# Patient Record
Sex: Female | Born: 1950
Health system: Southern US, Community
[De-identification: ages and names within clinical notes are randomized; demographics above are authoritative.]

## PROBLEM LIST (undated history)

## (undated) DIAGNOSIS — Z9889 Other specified postprocedural states: Secondary | ICD-10-CM

## (undated) DIAGNOSIS — R112 Nausea with vomiting, unspecified: Secondary | ICD-10-CM

## (undated) DIAGNOSIS — K859 Acute pancreatitis without necrosis or infection, unspecified: Secondary | ICD-10-CM

## (undated) DIAGNOSIS — T7840XA Allergy, unspecified, initial encounter: Secondary | ICD-10-CM

## (undated) DIAGNOSIS — M1710 Unilateral primary osteoarthritis, unspecified knee: Secondary | ICD-10-CM

## (undated) DIAGNOSIS — K219 Gastro-esophageal reflux disease without esophagitis: Secondary | ICD-10-CM

## (undated) DIAGNOSIS — D649 Anemia, unspecified: Secondary | ICD-10-CM

## (undated) DIAGNOSIS — E119 Type 2 diabetes mellitus without complications: Secondary | ICD-10-CM

## (undated) DIAGNOSIS — N189 Chronic kidney disease, unspecified: Secondary | ICD-10-CM

## (undated) DIAGNOSIS — M179 Osteoarthritis of knee, unspecified: Secondary | ICD-10-CM

## (undated) HISTORY — PX: SMALL INTESTINE SURGERY: SHX150

## (undated) HISTORY — DX: Acute pancreatitis without necrosis or infection, unspecified: K85.90

## (undated) HISTORY — PX: CHOLECYSTECTOMY: SHX55

## (undated) HISTORY — PX: APPENDECTOMY: SHX54

## (undated) HISTORY — DX: Osteoarthritis of knee, unspecified: M17.9

## (undated) HISTORY — DX: Allergy, unspecified, initial encounter: T78.40XA

## (undated) HISTORY — PX: ANKLE SURGERY: SHX546

## (undated) HISTORY — PX: OTHER SURGICAL HISTORY: SHX169

## (undated) HISTORY — PX: ABDOMINAL HYSTERECTOMY: SHX81

## (undated) HISTORY — DX: Anemia, unspecified: D64.9

## (undated) HISTORY — DX: Chronic kidney disease, unspecified: N18.9

## (undated) HISTORY — PX: EYE SURGERY: SHX253

## (undated) HISTORY — PX: JOINT REPLACEMENT: SHX530

## (undated) HISTORY — PX: FRACTURE SURGERY: SHX138

## (undated) HISTORY — DX: Unilateral primary osteoarthritis, unspecified knee: M17.10

## (undated) HISTORY — DX: Gastro-esophageal reflux disease without esophagitis: K21.9

---

## 2001-01-06 ENCOUNTER — Other Ambulatory Visit: Admission: RE | Admit: 2001-01-06 | Discharge: 2001-01-06 | Payer: Self-pay | Admitting: Obstetrics & Gynecology

## 2003-01-15 ENCOUNTER — Ambulatory Visit (HOSPITAL_BASED_OUTPATIENT_CLINIC_OR_DEPARTMENT_OTHER): Admission: RE | Admit: 2003-01-15 | Discharge: 2003-01-15 | Payer: Self-pay | Admitting: Orthopedic Surgery

## 2003-04-19 ENCOUNTER — Ambulatory Visit (HOSPITAL_BASED_OUTPATIENT_CLINIC_OR_DEPARTMENT_OTHER): Admission: RE | Admit: 2003-04-19 | Discharge: 2003-04-19 | Payer: Self-pay | Admitting: Orthopedic Surgery

## 2003-09-17 ENCOUNTER — Other Ambulatory Visit: Admission: RE | Admit: 2003-09-17 | Discharge: 2003-09-17 | Payer: Self-pay | Admitting: Obstetrics & Gynecology

## 2008-06-18 ENCOUNTER — Encounter: Admission: RE | Admit: 2008-06-18 | Discharge: 2008-07-18 | Payer: Self-pay | Admitting: Neurosurgery

## 2009-12-18 ENCOUNTER — Encounter: Admission: RE | Admit: 2009-12-18 | Discharge: 2009-12-18 | Payer: Self-pay | Admitting: Otolaryngology

## 2010-11-27 NOTE — Op Note (Signed)
   NAME:  Peggy Santiago, Peggy Santiago                       ACCOUNT NO.:  [id number]   MEDICAL RECORD NO.:  [id number]                   PATIENT TYPE:  AMB   LOCATION:  [facility]SC                                  FACILITY:  [facility]   PHYSICIAN:  Georges Lynch. Darrelyn Hillock, M.D.             DATE OF BIRTH:  1950/07/18   DATE OF PROCEDURE:  04/19/2003  DATE OF DISCHARGE:                                 OPERATIVE REPORT   SURGEON:  Georges Lynch. Darrelyn Hillock, M.D.   ASSISTANT:  Nurse.   PREOPERATIVE DIAGNOSES:  1. Chondromalacia of the medial femoral condyle, right knee.  2. Tear of the posterior horn, lateral meniscus, right knee.   POSTOPERATIVE DIAGNOSES:  1. Chondromalacia of the medial femoral condyle, right knee.  2. Tear of the posterior horn, lateral meniscus, right knee.   OPERATION/PROCEDURE:  1. Diagnostic arthroscopy, right knee.  2. Abrasion chondroplasty of medial femoral condyle, right knee.  3. Partial lateral meniscectomy, posterior horn, right knee.   DESCRIPTION OF PROCEDURE:  Under general anesthesia, routine orthopedic prep  and drape of the right knee was carried out.  A small punctate incision was  made in the suprapatellar pouch.  Inflow cannula was inserted.  Knee was  distended with saline.  Another small punctate incision was made in the  anterolateral joint.  Arthroscope was entered and a complete diagnostic  arthroscopy was carried out.  Following this,  I noticed she had a complex  tear of the posterior horn lateral meniscus.  The shaver/suction device was  utilized to shave that out.  Lateral joint space remaining looked good.  The  cruciates were intact.  I then went over the medial joint space.  She had an  obvious chondromalacia of the medial femoral condyle.  I introduced the  shaver suction device and did an abrasion chondroplasty.  The medial  meniscus was probed.  It was intact.  She had a synovitis which was chronic  in the medial joint space.  I used a shaver/suction device and  did a  synovectomy.  I then examined the suprapatellar pouch.  She had minimal  chondromalacia of her patella.  I thoroughly irrigated out the knee, closed  all three punctate incisions and 3-0 nylon sutures were used to close the  incisions.  I injected 20 mL 0.5% Marcaine and epinephrine in the knee  joint.  Sterile Neosporin bundle dressings applied.  The patient left the  operating room in satisfactory condition.                                               Ronald A. Darrelyn Hillock, M.D.    RAG/MEDQ  D:  04/19/2003  T:  04/19/2003  Job:  [id number]

## 2011-03-04 ENCOUNTER — Ambulatory Visit (HOSPITAL_COMMUNITY)
Admission: RE | Admit: 2011-03-04 | Discharge: 2011-03-04 | Disposition: A | Discharge status: Home / Self Care | Payer: 59 | Source: Ambulatory Visit | Attending: Family Medicine | Admitting: Family Medicine

## 2011-03-04 DIAGNOSIS — M79609 Pain in unspecified limb: Secondary | ICD-10-CM | POA: Insufficient documentation

## 2011-08-18 ENCOUNTER — Ambulatory Visit (INDEPENDENT_AMBULATORY_CARE_PROVIDER_SITE_OTHER): Payer: 59 | Admitting: Family Medicine

## 2011-08-18 VITALS — BP 126/84 | HR 103 | Temp 98.7°F | Resp 16 | Ht 61.75 in | Wt 172.8 lb

## 2011-08-18 DIAGNOSIS — R1084 Generalized abdominal pain: Secondary | ICD-10-CM

## 2011-08-18 LAB — POCT URINALYSIS DIPSTICK
Bilirubin, UA: NEGATIVE
Glucose, UA: NEGATIVE
Ketones, UA: NEGATIVE
Leukocytes, UA: NEGATIVE
Nitrite, UA: NEGATIVE
Protein, UA: NEGATIVE
Spec Grav, UA: >=1.03
Urobilinogen, UA: 0.2
pH, UA: 5.5

## 2011-08-18 LAB — POCT CBC
MID (cbc): 0.5 (ref 0–0.9)
POC LYMPH PERCENT: 7.3 %L — AB (ref 10–50)
Platelet Count, POC: 307 10*3/uL (ref 142–424)
RBC: 4.81 M/uL (ref 4.04–5.48)
RDW, POC: 13.8 %
WBC: 7.2 10*3/uL (ref 4.6–10.2)

## 2011-08-18 LAB — POCT UA - MICROSCOPIC ONLY: Casts, Ur, LPF, POC: NEGATIVE

## 2011-08-18 MED ORDER — GI COCKTAIL ~~LOC~~
30.0000 mL | Freq: Once | ORAL | Status: AC
  Administered 2011-08-18: 30 mL via ORAL

## 2011-08-18 MED ORDER — ONDANSETRON HCL 4 MG PO TABS: 4.0000 mg | ORAL_TABLET | Freq: Three times a day (TID) | ORAL | Status: AC | PRN

## 2011-08-18 NOTE — Progress Notes (Signed)
Subjective:    Patient ID: Peggy Santiago, female    DOB: [birth year], 61 y.o.   MRN: [id number]  HPI 61 yo female with h/o IBS and migraines with abdominal pain and headache and chills.  Loose stools today.  Feels bad, weak, low appetite.  Overnight Sunday/Mon symptoms started.  Has just been resting.  No vomitting.  No fever.  No recent travel or unusual foods.  Crampy, sore pain.  Eating and drinking a little bit. Has bad taste in mouth.  No history of hearburn.    Review of Systems Negative except as per HPI     Objective:   Physical Exam  Constitutional: Vital signs are normal. She appears well-developed and well-nourished. She is active.  Cardiovascular: Normal rate, regular rhythm, normal heart sounds and normal pulses.   Pulmonary/Chest: Effort normal and breath sounds normal.  Abdominal: Soft. Normal appearance and bowel sounds are normal. She exhibits no distension and no mass. There is no hepatosplenomegaly. There is tenderness. There is no rigidity, no rebound, no guarding, no CVA tenderness, no tenderness at McBurney's point and negative Murphy's sign. No hernia.  Neurological: She is alert.   Abdominal tenderness is mild and diffuse  Results for orders placed in visit on 08/18/11  POCT CBC      Component Value Range   WBC 7.2  4.6 - 10.2 (K/uL)   Lymph, poc 2.9  0.6 - 3.4    POC LYMPH PERCENT 7.3 (*) 10 - 50 (%L)   MID (cbc) 0.5  0 - 0.9    POC MID % 7.3  0 - 12 (%M)   POC Granulocyte 3.8  2 - 6.9    Granulocyte percent 52.1  37 - 80 (%G)   RBC 4.81  4.04 - 5.48 (M/uL)   Hemoglobin 13.7  12.2 - 16.2 (g/dL)   HCT, POC 40.9  81.1 - 47.9 (%)   MCV 89.8  80 - 97 (fL)   MCH, POC 28.5  27 - 31.2 (pg)   MCHC 31.7 (*) 31.8 - 35.4 (g/dL)   RDW, POC 91.4     Platelet Count, POC 307  142 - 424 (K/uL)   MPV 9.6  0 - 99.8 (fL)  POCT URINALYSIS DIPSTICK      Component Value Range   Color, UA yellow     Clarity, UA clear     Glucose, UA neg     Bilirubin, UA neg     Ketones, UA neg     Spec Grav, UA >=1.030     Blood, UA small     pH, UA 5.5     Protein, UA neg     Urobilinogen, UA 0.2     Nitrite, UA neg     Leukocytes, UA Negative    POCT UA - MICROSCOPIC ONLY      Component Value Range   WBC, Ur, HPF, POC 3-5     RBC, urine, microscopic 0-2     Bacteria, U Microscopic 2+     Mucus, UA neg     Epithelial cells, urine per micros 4-8     Crystals, Ur, HPF, POC neg     Casts, Ur, LPF, POC neg     Yeast, UA neg          Assessment & Plan:  Abdominal pain - normal labs, only mild, generalized tenderness.  Some improvement with GI cocktail.  Suspect viral illness with gastritis.  Zofran.  INB, consider sucralfate.  RTC if not  improved by the weekend.

## 2012-02-07 ENCOUNTER — Ambulatory Visit: Payer: 59 | Attending: Physical Medicine and Rehabilitation | Admitting: Rehabilitation

## 2012-02-07 DIAGNOSIS — M799 Soft tissue disorder, unspecified: Secondary | ICD-10-CM | POA: Insufficient documentation

## 2012-02-07 DIAGNOSIS — IMO0001 Reserved for inherently not codable concepts without codable children: Secondary | ICD-10-CM | POA: Insufficient documentation

## 2012-02-07 DIAGNOSIS — M542 Cervicalgia: Secondary | ICD-10-CM | POA: Insufficient documentation

## 2012-02-09 ENCOUNTER — Ambulatory Visit: Payer: 59 | Admitting: Rehabilitation

## 2012-02-14 ENCOUNTER — Ambulatory Visit: Payer: 59 | Attending: Physical Medicine and Rehabilitation | Admitting: Rehabilitation

## 2012-02-14 DIAGNOSIS — M799 Soft tissue disorder, unspecified: Secondary | ICD-10-CM | POA: Insufficient documentation

## 2012-02-14 DIAGNOSIS — IMO0001 Reserved for inherently not codable concepts without codable children: Secondary | ICD-10-CM | POA: Insufficient documentation

## 2012-02-14 DIAGNOSIS — M542 Cervicalgia: Secondary | ICD-10-CM | POA: Insufficient documentation

## 2012-02-16 ENCOUNTER — Encounter: Payer: 59 | Admitting: Rehabilitation

## 2012-02-21 ENCOUNTER — Ambulatory Visit: Payer: 59 | Admitting: Rehabilitation

## 2012-02-23 ENCOUNTER — Ambulatory Visit: Payer: 59 | Admitting: Rehabilitation

## 2012-02-29 ENCOUNTER — Ambulatory Visit: Payer: 59 | Admitting: Rehabilitation

## 2012-03-06 ENCOUNTER — Other Ambulatory Visit: Payer: Self-pay | Admitting: Family Medicine

## 2012-03-06 NOTE — Telephone Encounter (Signed)
?

## 2012-03-20 ENCOUNTER — Telehealth: Payer: Self-pay

## 2012-03-20 NOTE — Telephone Encounter (Signed)
I will pull chart.  

## 2012-03-20 NOTE — Telephone Encounter (Signed)
The patient called to request refill of Celebrex.  The patient stated that this was denied at the pharmacy due to need for office visit, but the patient stated she just wants this refilled because she does not feel she needs to be seen by [facility] for orthopaedic care, and that the last time she saw Dr. Patsy Lager, Dr. Patsy Lager stated she would refill the medication.  Please call patient at [phone].

## 2012-03-21 NOTE — Telephone Encounter (Signed)
Chart at desk please advise on renewal.

## 2012-03-22 MED ORDER — CELECOXIB 200 MG PO CAPS: 200.0000 mg | ORAL_CAPSULE | Freq: Every day | ORAL | Status: DC

## 2012-03-22 NOTE — Telephone Encounter (Signed)
I think we should be fine to refill her celebrex- let me see her chart and I will make sure

## 2012-03-22 NOTE — Telephone Encounter (Signed)
Chart reviewed with Dr Patsy Lager, she has renewed #30 with a refill, this is sent to her pharmacy patient advised.

## 2012-03-27 ENCOUNTER — Ambulatory Visit: Payer: 59 | Attending: Physical Medicine and Rehabilitation | Admitting: Rehabilitation

## 2012-03-27 DIAGNOSIS — IMO0001 Reserved for inherently not codable concepts without codable children: Secondary | ICD-10-CM | POA: Insufficient documentation

## 2012-03-27 DIAGNOSIS — M542 Cervicalgia: Secondary | ICD-10-CM | POA: Insufficient documentation

## 2012-03-27 DIAGNOSIS — M799 Soft tissue disorder, unspecified: Secondary | ICD-10-CM | POA: Insufficient documentation

## 2012-03-30 ENCOUNTER — Ambulatory Visit: Payer: 59 | Admitting: Rehabilitation

## 2012-04-06 ENCOUNTER — Ambulatory Visit: Payer: 59 | Admitting: Rehabilitation

## 2012-04-17 ENCOUNTER — Ambulatory Visit (INDEPENDENT_AMBULATORY_CARE_PROVIDER_SITE_OTHER): Payer: 59 | Admitting: Physician Assistant

## 2012-04-17 VITALS — BP 126/74 | HR 72 | Temp 98.1°F | Resp 16 | Ht 62.0 in | Wt 180.0 lb

## 2012-04-17 DIAGNOSIS — Z79899 Other long term (current) drug therapy: Secondary | ICD-10-CM

## 2012-04-17 DIAGNOSIS — R635 Abnormal weight gain: Secondary | ICD-10-CM

## 2012-04-17 DIAGNOSIS — Z23 Encounter for immunization: Secondary | ICD-10-CM

## 2012-04-17 DIAGNOSIS — E7800 Pure hypercholesterolemia, unspecified: Secondary | ICD-10-CM

## 2012-04-17 DIAGNOSIS — D510 Vitamin B12 deficiency anemia due to intrinsic factor deficiency: Secondary | ICD-10-CM

## 2012-04-17 DIAGNOSIS — E669 Obesity, unspecified: Secondary | ICD-10-CM

## 2012-04-17 LAB — COMPREHENSIVE METABOLIC PANEL
AST: 17 U/L (ref 0–37)
CO2: 28 mEq/L (ref 19–32)
Chloride: 106 mEq/L (ref 96–112)
Creat: 0.81 mg/dL (ref 0.50–1.10)
Glucose, Bld: 99 mg/dL (ref 70–99)

## 2012-04-17 LAB — POCT CBC
Granulocyte percent: 46.6 %G (ref 37–80)
HCT, POC: 37.9 % (ref 37.7–47.9)
Hemoglobin: 11.8 g/dL — AB (ref 12.2–16.2)
Lymph, poc: 2.2 (ref 0.6–3.4)
MCHC: 31.1 g/dL — AB (ref 31.8–35.4)
MCV: 92.4 fL (ref 80–97)
MPV: 10.2 fL (ref 0–99.8)
RBC: 4.1 M/uL (ref 4.04–5.48)
RDW, POC: 13.5 %
WBC: 4.8 10*3/uL (ref 4.6–10.2)

## 2012-04-17 LAB — LIPID PANEL
Cholesterol: 220 mg/dL — ABNORMAL HIGH (ref 0–200)
HDL: 32 mg/dL — ABNORMAL LOW (ref >39)
Triglycerides: 233 mg/dL — ABNORMAL HIGH (ref <150)

## 2012-04-17 LAB — TSH: TSH: 0.751 u[IU]/mL (ref 0.350–4.500)

## 2012-04-17 LAB — VITAMIN B12: Vitamin B-12: 324 pg/mL (ref 211–911)

## 2012-04-17 MED ORDER — CYANOCOBALAMIN 1000 MCG/ML IJ SOLN: 1000.0000 ug | Freq: Once | INTRAMUSCULAR | Status: DC

## 2012-04-17 MED ORDER — CELECOXIB 200 MG PO CAPS: 200.0000 mg | ORAL_CAPSULE | Freq: Every day | ORAL | Status: DC

## 2012-04-17 NOTE — Progress Notes (Signed)
  Subjective:    Patient ID: Peggy Santiago, female    DOB: [birth year], 61 y.o.   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 Denies any problems currently.  She struggles with going up and down with her weight.  I reviewed her health history and updated her MMG and pap in the chart. It has been a while since we have checked her B12 level.  She has been having 1 ml injections monthly.  Review of Systems  All other systems reviewed and are negative.       Objective:   Physical Exam  Nursing note and vitals reviewed. Constitutional: She is oriented to person, place, and time. She appears well-developed and well-nourished.  HENT:  Head: Normocephalic and atraumatic.  Neck: Normal range of motion. Neck supple. No thyromegaly present.  Cardiovascular: Normal rate, regular rhythm and normal heart sounds.   Pulmonary/Chest: Effort normal and breath sounds normal.  Lymphadenopathy:    She has no cervical adenopathy.  Neurological: She is alert and oriented to person, place, and time.  Skin: Skin is warm and dry.  Psychiatric: She has a normal mood and affect. Her behavior is normal. Judgment and thought content normal.   Results for orders placed in visit on 04/17/12  POCT CBC      Component Value Range   WBC 4.8  4.6 - 10.2 K/uL   Lymph, poc 2.2  0.6 - 3.4   POC LYMPH PERCENT 45.6  10 - 50 %L   MID (cbc) 0.4  0 - 0.9   POC MID % 7.8  0 - 12 %M   POC Granulocyte 2.2  2 - 6.9   Granulocyte percent 46.6  37 - 80 %G   RBC 4.10  4.04 - 5.48 M/uL   Hemoglobin 11.8 (*) 12.2 - 16.2 g/dL   HCT, POC 14.7  82.9 - 47.9 %   MCV 92.4  80 - 97 fL   MCH, POC 28.8  27 - 31.2 pg   MCHC 31.1 (*) 31.8 - 35.4 g/dL   RDW, POC 56.2     Platelet Count, POC 220  142 - 424 K/uL   MPV 10.2  0 - 99.8 fL       Assessment & Plan:  Pernicious  anemia-check B12, can increase or decrease current injection dosing/frequency if needed. Influenza vaccine given Obesity with weight gain-form filled out that she is fine to participate in a progressively increasing exercise regimen with a personal trainer. Osteoarthritis of knees-celebrex rfd. Shingles vaccine written for on regular prescription  Checking labs-recheck in 6 months

## 2012-04-18 ENCOUNTER — Other Ambulatory Visit: Payer: Self-pay | Admitting: Family Medicine

## 2012-04-18 ENCOUNTER — Telehealth: Payer: Self-pay | Admitting: Family Medicine

## 2012-04-18 DIAGNOSIS — E785 Hyperlipidemia, unspecified: Secondary | ICD-10-CM

## 2012-04-18 MED ORDER — SIMVASTATIN 40 MG PO TABS: 40.0000 mg | ORAL_TABLET | Freq: Every day | ORAL | Status: DC

## 2012-04-18 NOTE — Telephone Encounter (Signed)
LM that Vitamin B12 in the range of normal but low, continue with once monthly injections. She has high cholesterol. Needs to start Simvastatin 40 mg daily in addition to fish oil. IF she decides to take it, I have already faxed rx to [facility], then she needs to come back in 2-3 months to get fasting lipids rechecked.

## 2012-04-21 ENCOUNTER — Encounter: Payer: Self-pay | Admitting: Physician Assistant

## 2012-08-31 ENCOUNTER — Ambulatory Visit (INDEPENDENT_AMBULATORY_CARE_PROVIDER_SITE_OTHER): Payer: 59 | Admitting: Family Medicine

## 2012-08-31 ENCOUNTER — Ambulatory Visit: Payer: 59

## 2012-08-31 VITALS — BP 131/90 | HR 77 | Temp 98.3°F | Resp 18 | Wt 172.0 lb

## 2012-08-31 DIAGNOSIS — Z87442 Personal history of urinary calculi: Secondary | ICD-10-CM

## 2012-08-31 DIAGNOSIS — R10A0 Flank pain, unspecified side: Secondary | ICD-10-CM

## 2012-08-31 DIAGNOSIS — R1032 Left lower quadrant pain: Secondary | ICD-10-CM

## 2012-08-31 DIAGNOSIS — R8281 Pyuria: Secondary | ICD-10-CM

## 2012-08-31 LAB — POCT UA - MICROSCOPIC ONLY
Casts, Ur, LPF, POC: NEGATIVE
Crystals, Ur, HPF, POC: NEGATIVE
Mucus, UA: NEGATIVE
Yeast, UA: NEGATIVE

## 2012-08-31 LAB — POCT URINALYSIS DIPSTICK
Bilirubin, UA: NEGATIVE
Blood, UA: NEGATIVE
Glucose, UA: NEGATIVE
Protein, UA: NEGATIVE
Spec Grav, UA: 1.015
pH, UA: 7

## 2012-08-31 MED ORDER — CIPROFLOXACIN HCL 500 MG PO TABS: 500.0000 mg | ORAL_TABLET | Freq: Two times a day (BID) | ORAL | Status: DC

## 2012-08-31 MED ORDER — HYDROCODONE-ACETAMINOPHEN 5-325 MG PO TABS: 1.0000 | ORAL_TABLET | Freq: Four times a day (QID) | ORAL | Status: DC | PRN

## 2012-08-31 MED ORDER — KETOROLAC TROMETHAMINE 60 MG/2ML IM SOLN
60.0000 mg | Freq: Once | INTRAMUSCULAR | Status: AC
  Administered 2012-08-31: 60 mg via INTRAMUSCULAR

## 2012-08-31 NOTE — Progress Notes (Signed)
Subjective:    Patient ID: Peggy Santiago, female    DOB: 22-Jan-1951, 62 y.o.   MRN: [id number]  HPI Peggy Santiago is a 62 y.o. female  Mid - lower L back pain - past 4 days.  NKI. Thought was getting better then worse this am. Did have lower abd discomfort 2 days ago - not now.  Tx: tramadol, ibuprofen. 1/2 of flexeril, and crangrape.   Hx of kidney stone in past., felt similar.  Last episode 6-8 months ago. Usually once per year.  usually small and passes on own. No hx of lithotripsy or surgery.  Last visit with [facility] - 3-4 years ago.  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 Nonsmoker.    Review of Systems  Constitutional: Negative for fever and chills.  Gastrointestinal: Positive for nausea, abdominal pain and constipation (slight harder stool with ultram. ). Negative for vomiting.  Genitourinary: Negative for dysuria, urgency, frequency, hematuria and difficulty urinating.  Musculoskeletal: Positive for back pain.       Objective:   Physical Exam  Constitutional: She is oriented to person, place, and time. She appears well-developed and well-nourished.  HENT:  Head: Normocephalic and atraumatic.  Pulmonary/Chest: Effort normal.  Abdominal: Soft. Normal appearance. She exhibits no distension. There is tenderness in the left lower quadrant. There is CVA tenderness (slight L sided. ). There is no rebound and no guarding.  Musculoskeletal:       Lumbar back: She exhibits decreased range of motion (guarded exam, but appears uncomfortable.). She exhibits no bony tenderness, no swelling and no deformity.  Negative SLR bilaterally.   Neurological: She is alert and oriented to person, place, and time.  Skin: Skin is warm.  Psychiatric: She has a normal mood and affect. Her behavior is normal.   Results for orders placed in visit on 08/31/12  POCT UA - MICROSCOPIC ONLY      Result Value Range   WBC, Ur, HPF, POC 2-5     RBC, urine,  microscopic 0-1     Bacteria, U Microscopic trace     Mucus, UA neg     Epithelial cells, urine per micros 8-10     Crystals, Ur, HPF, POC neg     Casts, Ur, LPF, POC neg     Yeast, UA neg    POCT URINALYSIS DIPSTICK      Result Value Range   Color, UA light yellow     Clarity, UA clear     Glucose, UA neg     Bilirubin, UA neg     Ketones, UA neg     Spec Grav, UA 1.015     Blood, UA neg     pH, UA 7.0     Protein, UA neg     Urobilinogen, UA 0.2     Nitrite, UA neg     Leukocytes, UA Trace     [facility] reading (PRIMARY) by  Dr. Neva Seat: Abdomen 1 view - few phleboliths in pelvis, otherwise nonspecific bowel gas findings.      Assessment & Plan:  Peggy Santiago is a 62 y.o. female Flank pain - Plan: POCT UA - Microscopic Only, POCT urinalysis dipstick, DG Abd 1 View, ketorolac (TORADOL) injection 60 mg, Urine culture, ciprofloxacin (CIPRO) 500 MG tablet, HYDROcodone-acetaminophen (NORCO/VICODIN) 5-325 MG per tablet.  ddx includes recurrent nephrolith but more wbc than rbc.  Less likely pyelo, but will start abx and check cx as above.   LLQ  abdominal pain - Plan: POCT UA - Microscopic Only, POCT urinalysis dipstick, DG Abd 1 View, ketorolac (TORADOL) injection 60 mg, ciprofloxacin (CIPRO) 500 MG tablet, HYDROcodone-acetaminophen (NORCO/VICODIN) 5-325 MG per tablet.  As above. lortab if needed. toradol injection given in office.  rtc precautions.   Pyuria - Plan: ciprofloxacin (CIPRO) 500 MG tablet.  cx pending.   History of nephrolithiasis - Plan: HYDROcodone-acetaminophen (NORCO/VICODIN) 5-325 MG per tablet - as above.  Patient Instructions  Drink plenty of fluids, 1 to 2 hydrocodone every 6 hours as needed, start antibiotic. Your should receive a call or letter about your lab results within the next week to 10 days, but follow up in the next 3 days for recheck to determine if a cat scan is needed. Return to the clinic or go to the nearest emergency room if any of your symptoms  worsen or new symptoms occur.     Meds ordered this encounter  Medications  .       . ketorolac (TORADOL) injection 60 mg    Sig:   . ciprofloxacin (CIPRO) 500 MG tablet    Sig: Take 1 tablet (500 mg total) by mouth 2 (two) times daily.    Dispense:  20 tablet    Refill:  0  . HYDROcodone-acetaminophen (NORCO/VICODIN) 5-325 MG per tablet    Sig: Take 1 tablet by mouth every 6 (six) hours as needed for pain.    Dispense:  20 tablet    Refill:  0

## 2012-08-31 NOTE — Patient Instructions (Signed)
Drink plenty of fluids, 1 to 2 hydrocodone every 6 hours as needed, start antibiotic. Your should receive a call or letter about your lab results within the next week to 10 days, but follow up in the next 3 days for recheck to determine if a cat scan is needed. Return to the clinic or go to the nearest emergency room if any of your symptoms worsen or new symptoms occur.

## 2012-09-02 LAB — URINE CULTURE

## 2012-09-29 ENCOUNTER — Ambulatory Visit (INDEPENDENT_AMBULATORY_CARE_PROVIDER_SITE_OTHER): Payer: 59 | Admitting: Family Medicine

## 2012-09-29 VITALS — BP 132/80 | HR 76 | Temp 98.3°F | Resp 16 | Ht 61.5 in | Wt 170.0 lb

## 2012-09-29 DIAGNOSIS — E860 Dehydration: Secondary | ICD-10-CM

## 2012-09-29 DIAGNOSIS — K529 Noninfective gastroenteritis and colitis, unspecified: Secondary | ICD-10-CM

## 2012-09-29 DIAGNOSIS — K5289 Other specified noninfective gastroenteritis and colitis: Secondary | ICD-10-CM

## 2012-09-29 DIAGNOSIS — R112 Nausea with vomiting, unspecified: Secondary | ICD-10-CM

## 2012-09-29 LAB — POCT CBC
Hemoglobin: 12.6 g/dL (ref 12.2–16.2)
Lymph, poc: 2.4 (ref 0.6–3.4)
MCH, POC: 28.7 pg (ref 27–31.2)
MCHC: 31.7 g/dL — AB (ref 31.8–35.4)
MID (cbc): 0.4 (ref 0–0.9)
POC LYMPH PERCENT: 38.8 %L (ref 10–50)
POC MID %: 6.8 %M (ref 0–12)
RDW, POC: 14.6 %

## 2012-09-29 LAB — POCT URINALYSIS DIPSTICK
Leukocytes, UA: NEGATIVE
Nitrite, UA: NEGATIVE
pH, UA: 5.5

## 2012-09-29 LAB — COMPREHENSIVE METABOLIC PANEL
ALT: 21 U/L (ref 0–35)
AST: 22 U/L (ref 0–37)
Albumin: 4.6 g/dL (ref 3.5–5.2)
BUN: 26 mg/dL — ABNORMAL HIGH (ref 6–23)
CO2: 24 mEq/L (ref 19–32)
Calcium: 9.8 mg/dL (ref 8.4–10.5)
Chloride: 106 mEq/L (ref 96–112)
Sodium: 141 mEq/L (ref 135–145)
Total Bilirubin: 0.8 mg/dL (ref 0.3–1.2)
Total Protein: 7.9 g/dL (ref 6.0–8.3)

## 2012-09-29 LAB — POCT UA - MICROSCOPIC ONLY
Casts, Ur, LPF, POC: NEGATIVE
WBC, Ur, HPF, POC: 0.2

## 2012-09-29 MED ORDER — ONDANSETRON 4 MG PO TBDP: ORAL_TABLET | ORAL | Status: DC

## 2012-09-29 MED ORDER — ONDANSETRON 4 MG PO TBDP
4.0000 mg | ORAL_TABLET | Freq: Once | ORAL | Status: AC
  Administered 2012-09-29: 4 mg via ORAL

## 2012-09-29 NOTE — Patient Instructions (Signed)
Viral Gastroenteritis Viral gastroenteritis is also known as stomach flu. This condition affects the stomach and intestinal tract. It can cause sudden diarrhea and vomiting. The illness typically lasts 3 to 8 days. Most people develop an immune response that eventually gets rid of the virus. While this natural response develops, the virus can make you quite ill. CAUSES  Many different viruses can cause gastroenteritis, such as rotavirus or noroviruses. You can catch one of these viruses by consuming contaminated food or water. You may also catch a virus by sharing utensils or other personal items with an infected person or by touching a contaminated surface. SYMPTOMS  The most common symptoms are diarrhea and vomiting. These problems can cause a severe loss of body fluids (dehydration) and a body salt (electrolyte) imbalance. Other symptoms may include:  Fever.  Headache.  Fatigue.  Abdominal pain. DIAGNOSIS  Your caregiver can usually diagnose viral gastroenteritis based on your symptoms and a physical exam. A stool sample may also be taken to test for the presence of viruses or other infections. TREATMENT  This illness typically goes away on its own. Treatments are aimed at rehydration. The most serious cases of viral gastroenteritis involve vomiting so severely that you are not able to keep fluids down. In these cases, fluids must be given through an intravenous line (IV). HOME CARE INSTRUCTIONS   Drink enough fluids to keep your urine clear or pale yellow. Drink small amounts of fluids frequently and increase the amounts as tolerated.  Ask your caregiver for specific rehydration instructions.  Avoid:  Foods high in sugar.  Alcohol.  Carbonated drinks.  Tobacco.  Juice.  Caffeine drinks.  Extremely hot or cold fluids.  Fatty, greasy foods.  Too much intake of anything at one time.  Dairy products until 24 to 48 hours after diarrhea stops.  You may consume probiotics.  Probiotics are active cultures of beneficial bacteria. They may lessen the amount and number of diarrheal stools in adults. Probiotics can be found in yogurt with active cultures and in supplements.  Wash your hands well to avoid spreading the virus.  Only take over-the-counter or prescription medicines for pain, discomfort, or fever as directed by your caregiver. Do not give aspirin to children. Antidiarrheal medicines are not recommended.  Ask your caregiver if you should continue to take your regular prescribed and over-the-counter medicines.  Keep all follow-up appointments as directed by your caregiver. SEEK IMMEDIATE MEDICAL CARE IF:   You are unable to keep fluids down.  You do not urinate at least once every 6 to 8 hours.  You develop shortness of breath.  You notice blood in your stool or vomit. This may look like coffee grounds.  You have abdominal pain that increases or is concentrated in one small area (localized).  You have persistent vomiting or diarrhea.  You have a fever.  The patient is a child younger than 3 months, and he or she has a fever.  The patient is a child older than 3 months, and he or she has a fever and persistent symptoms.  The patient is a child older than 3 months, and he or she has a fever and symptoms suddenly get worse.  The patient is a baby, and he or she has no tears when crying. MAKE SURE YOU:   Understand these instructions.  Will watch your condition.  Will get help right away if you are not doing well or get worse. Document Released: 06/28/2005 Document Revised: 09/20/2011 Document Reviewed: 04/14/2011   [employer] Patient Information 2013 [employer].  

## 2012-09-29 NOTE — Progress Notes (Signed)
Subjective: 62 year old lady who's been sick for 3 days. She had a cough and cold prior to that, and says she has been a little sore in her chest from coughing and then from the vomiting. Raising is she's acutely developed vomiting and diarrhea at the same time. It continued to persist into the next day with nonstop vomiting. That then stopped but the diarrhea persisted until yesterday. She has not had a bowel movement since last night. She has not been able to eat anything that she's taking a little bit of soda. She is sore all over, especially the abdomen, but no acute pain.  Review of systems: HEENT unremarkable. Respiratory unremarkable. Cardiovascular: Unremarkable. GI as above. GU unremarkable. Musculoskeletal unremarkable.  Objective: Afebrile. Very pale looking. Throat clear but a little bit dry. Neck supple without nodes. Chest is clear to auscultation. Heart regular without any murmurs. Abdomen is generally tender. Normal to percussion. Soft without masses. Bowel sounds are present. Has a cholecystectomy scar.  Assessment: Gastroenteritis with nausea vomiting and diarrhea Probable moderate dehydration  Plan: CBC, urinalysis, C. met, IV fluids 2 L  Results for orders placed in visit on 09/29/12  POCT CBC      Result Value Range   WBC 6.1  4.6 - 10.2 K/uL   Lymph, poc 2.4  0.6 - 3.4   POC LYMPH PERCENT 38.8  10 - 50 %L   MID (cbc) 0.4  0 - 0.9   POC MID % 6.8  0 - 12 %M   POC Granulocyte 3.3  2 - 6.9   Granulocyte percent 54.4  37 - 80 %G   RBC 4.39  4.04 - 5.48 M/uL   Hemoglobin 12.6  12.2 - 16.2 g/dL   HCT, POC 16.1  09.6 - 47.9 %   MCV 90.6  80 - 97 fL   MCH, POC 28.7  27 - 31.2 pg   MCHC 31.7 (*) 31.8 - 35.4 g/dL   RDW, POC 04.5     Platelet Count, POC 253  142 - 424 K/uL   MPV 9.5  0 - 99.8 fL  POCT URINALYSIS DIPSTICK      Result Value Range   Color, UA yellow     Clarity, UA clear     Glucose, UA neg     Bilirubin, UA neg     Ketones, UA neg     Spec Grav, UA  1.025     Blood, UA small     pH, UA 5.5     Protein, UA trace     Urobilinogen, UA 0.2     Nitrite, UA neg     Leukocytes, UA Negative    POCT UA - MICROSCOPIC ONLY      Result Value Range   WBC, Ur, HPF, POC 0.2     RBC, urine, microscopic 1-3     Bacteria, U Microscopic trace     Mucus, UA neg     Epithelial cells, urine per micros 3-12     Crystals, Ur, HPF, POC neg     Casts, Ur, LPF, POC neg     Yeast, UA neg     reexamined her several times while she is here. She was feeling better by the time she left the still weak. She has urinated once you receive the 2 L of IV fluids. Is less nauseated. Got a single dose of Zofran while here. Instructions were given her. She knows to come back in if in all worse.

## 2012-10-01 ENCOUNTER — Encounter: Payer: Self-pay | Admitting: Family Medicine

## 2012-12-21 ENCOUNTER — Ambulatory Visit (INDEPENDENT_AMBULATORY_CARE_PROVIDER_SITE_OTHER): Payer: 59 | Admitting: Family Medicine

## 2012-12-21 VITALS — BP 141/84 | HR 71 | Temp 98.0°F | Resp 17 | Ht 62.0 in | Wt 180.0 lb

## 2012-12-21 DIAGNOSIS — G5601 Carpal tunnel syndrome, right upper limb: Secondary | ICD-10-CM

## 2012-12-21 DIAGNOSIS — G5600 Carpal tunnel syndrome, unspecified upper limb: Secondary | ICD-10-CM

## 2012-12-21 MED ORDER — AMITRIPTYLINE HCL 25 MG PO TABS: 25.0000 mg | ORAL_TABLET | Freq: Every day | ORAL | Status: DC

## 2012-12-21 MED ORDER — TRAMADOL HCL 50 MG PO TABS: 50.0000 mg | ORAL_TABLET | Freq: Three times a day (TID) | ORAL | Status: DC | PRN

## 2012-12-21 NOTE — Patient Instructions (Addendum)

## 2012-12-21 NOTE — Progress Notes (Signed)
Subjective:    Patient ID: Peggy Santiago, female    DOB: 11/18/1950, 62 y.o.   MRN: [id number] Chief Complaint  Patient presents with  . Hand Pain    numbness in hand    HPI  Has been dealing with this for about 7 yrs  - gave her a brace to which she wears intermittently but over the past wk has been severe - keeping her from sleep - can't even hold a pen - right handed.  She is having soreness and achiness of forearm. She has never had any imaging or further eval.  Occ numbness of Left hand but not really.  feeling pain in the palmer MTP area.  Past Medical History  Diagnosis Date  . Allergy   . Chronic kidney disease     kidney stones  . Osteoarthritis of knee   . Osteoarthritis of knee   . Anemia    Current Outpatient Prescriptions on File Prior to Visit  Medication Sig Dispense Refill  . celecoxib (CELEBREX) 200 MG capsule Take 200 mg by mouth 2 (two) times daily.      . cetirizine (ZYRTEC) 10 MG tablet Take 20 mg by mouth daily.      . cyanocobalamin (,VITAMIN B-12,) 1000 MCG/ML injection Inject 1 mL (1,000 mcg total) into the muscle once. monthly  1 mL  5  . fish oil-omega-3 fatty acids 1000 MG capsule Take 2 g by mouth daily.      . Ginkgo Biloba (GINKOBA) 40 MG TABS Take by mouth.       No current facility-administered medications on file prior to visit.   Allergies  Allergen Reactions  . Contrast Media [Iodinated Diagnostic Agents]      Review of Systems  Constitutional: Positive for activity change. Negative for fever, chills, appetite change and unexpected weight change.  Musculoskeletal: Positive for myalgias and arthralgias. Negative for joint swelling and gait problem.  Skin: Negative for rash.  Neurological: Positive for weakness and numbness. Negative for tremors.  Psychiatric/Behavioral: Positive for sleep disturbance. The patient is not nervous/anxious.       BP 141/84  Pulse 71  Temp(Src) 98 F (36.7 C) (Oral)  Resp 17  Ht 5\' 2"  (1.575 m)  Wt  180 lb (81.647 kg)  BMI 32.91 kg/m2  SpO2 98% Objective:   Physical Exam  Constitutional: She is oriented to person, place, and time. She appears well-developed and well-nourished. No distress.  HENT:  Head: Normocephalic and atraumatic.  Right Ear: External ear normal.  Eyes: Conjunctivae are normal. No scleral icterus.  Cardiovascular:  Pulses:      Radial pulses are 2+ on the right side.  Pulmonary/Chest: Effort normal.  Musculoskeletal: She exhibits no edema.       Right elbow: Normal.      Right wrist: She exhibits decreased range of motion. She exhibits no tenderness, no bony tenderness and no swelling.       Right hand: She exhibits normal range of motion and normal capillary refill. Decreased sensation noted. Decreased strength noted.  Neurological: She is alert and oriented to person, place, and time.  Skin: Skin is warm and dry. She is not diaphoretic. No erythema.  Psychiatric: She has a normal mood and affect. Her behavior is normal.   + tinel's and phalen's and reverse phalen's triggering pain and numbness in right hand - esp first thumb    Assessment & Plan:  Carpal tunnel syndrome, right - Plan: NCV with EMG(electromyography), Ambulatory referral to Hand  Surgery - pt has failed conservative trx so will refer to hand surg to see if she is a candidate for injections or surgical repair. Will confirm diagnosis w/ NCV/EMG - hopefully ordering it how will speed up process. Start below meds for pain until more definitive trx.   Meds ordered this encounter  Medications  . traMADol (ULTRAM) 50 MG tablet    Sig: Take 1 tablet (50 mg total) by mouth every 8 (eight) hours as needed for pain.    Dispense:  30 tablet    Refill:  1  . amitriptyline (ELAVIL) 25 MG tablet    Sig: Take 1 tablet (25 mg total) by mouth at bedtime.    Dispense:  90 tablet    Refill:  0

## 2013-02-02 ENCOUNTER — Ambulatory Visit (INDEPENDENT_AMBULATORY_CARE_PROVIDER_SITE_OTHER): Payer: 59 | Admitting: Neurology

## 2013-02-02 ENCOUNTER — Encounter (INDEPENDENT_AMBULATORY_CARE_PROVIDER_SITE_OTHER): Payer: 59

## 2013-02-02 DIAGNOSIS — R209 Unspecified disturbances of skin sensation: Secondary | ICD-10-CM

## 2013-02-02 DIAGNOSIS — E7800 Pure hypercholesterolemia, unspecified: Secondary | ICD-10-CM

## 2013-02-02 DIAGNOSIS — G5600 Carpal tunnel syndrome, unspecified upper limb: Secondary | ICD-10-CM

## 2013-02-02 DIAGNOSIS — G5601 Carpal tunnel syndrome, right upper limb: Secondary | ICD-10-CM

## 2013-02-02 DIAGNOSIS — Z0289 Encounter for other administrative examinations: Secondary | ICD-10-CM

## 2013-02-02 NOTE — Procedures (Signed)
    [facility]  NCS (NERVE CONDUCTION STUDY) WITH EMG (ELECTROMYOGRAPHY) REPORT   STUDY DATE: 02/02/2013 PATIENT NAME: Peggy Santiago DOB: 15-Mar-1951 MRN: [id number]    TECHNOLOGIST: Judithann Sheen ELECTROMYOGRAPHER: Levert Feinstein M.D.  CLINICAL INFORMATION:   62 years old right-handed Caucasian female, with 6 years history of right hand paresthesia, failed to improve by wrist splint, she has no significant weakness,  On examination, bilateral abductor pollicis brevis, opponens muscle strength was normal. Bilateral wrist Tinel signs were normal.   FINDINGS: NERVE CONDUCTION STUDY: Right median sensory response showed moderately prolonged peak latency, with normal snap amplitude. Bilateral ulnar, left median sensory responses were normal.  Bilateral ulnar motor responses were normal. Left median motor responses were normal. Right median motor response showed mildly prolonged distal latency, with normal conduction velocity, C. map amplitude, Mildly prolonged F-wave latency.    NEEDLE ELECTROMYOGRAPHY: Selected needle examination was performed at right upper extremity muscles, and the right cervical paraspinal muscles .  Needle examination of right abductor pollicis brevis, pronator teres, biceps, triceps, deltoid was normal .  There was no spontaneous activity at right cervical paraspinal muscles, right C5, C6, C7    IMPRESSION:   This is an abnormal study. There is electrodiagnostic evidence of median neuropathy across the wrist, consistent with a moderate right carpal tunnel syndrome. There was no electrodiagnostic evidence of right cervical radiculopathy.  RERPRETING PHYSICIAN:   Levert Feinstein M.D. Ph.D. [facility] [address], [address] [place], [place] [address] [phone]

## 2013-02-12 ENCOUNTER — Ambulatory Visit
Admission: RE | Admit: 2013-02-12 | Discharge: 2013-02-12 | Disposition: A | Discharge status: Home / Self Care | Payer: 59 | Source: Ambulatory Visit | Attending: Family Medicine | Admitting: Family Medicine

## 2013-02-12 ENCOUNTER — Ambulatory Visit (INDEPENDENT_AMBULATORY_CARE_PROVIDER_SITE_OTHER): Payer: 59 | Admitting: Family Medicine

## 2013-02-12 ENCOUNTER — Ambulatory Visit: Payer: 59

## 2013-02-12 VITALS — BP 130/80 | HR 72 | Temp 98.2°F | Resp 18 | Ht 61.5 in | Wt 177.8 lb

## 2013-02-12 DIAGNOSIS — R209 Unspecified disturbances of skin sensation: Secondary | ICD-10-CM

## 2013-02-12 DIAGNOSIS — R109 Unspecified abdominal pain: Secondary | ICD-10-CM

## 2013-02-12 DIAGNOSIS — R55 Syncope and collapse: Secondary | ICD-10-CM

## 2013-02-12 DIAGNOSIS — M25569 Pain in unspecified knee: Secondary | ICD-10-CM

## 2013-02-12 DIAGNOSIS — E7800 Pure hypercholesterolemia, unspecified: Secondary | ICD-10-CM

## 2013-02-12 DIAGNOSIS — R479 Unspecified speech disturbances: Secondary | ICD-10-CM

## 2013-02-12 LAB — COMPREHENSIVE METABOLIC PANEL
ALT: 16 U/L (ref 0–35)
AST: 15 U/L (ref 0–37)
Albumin: 4.4 g/dL (ref 3.5–5.2)
Alkaline Phosphatase: 64 U/L (ref 39–117)
BUN: 15 mg/dL (ref 6–23)
CO2: 27 mEq/L (ref 19–32)
Calcium: 9.7 mg/dL (ref 8.4–10.5)
Chloride: 108 mEq/L (ref 96–112)
Creat: 0.82 mg/dL (ref 0.50–1.10)
Glucose, Bld: 111 mg/dL — ABNORMAL HIGH (ref 70–99)
Potassium: 4.6 mEq/L (ref 3.5–5.3)
Sodium: 142 mEq/L (ref 135–145)
Total Bilirubin: 0.5 mg/dL (ref 0.3–1.2)
Total Protein: 7.1 g/dL (ref 6.0–8.3)

## 2013-02-12 LAB — POCT URINALYSIS DIPSTICK
Leukocytes, UA: NEGATIVE
Nitrite, UA: NEGATIVE
Urobilinogen, UA: 0.2

## 2013-02-12 LAB — POCT UA - MICROSCOPIC ONLY
Casts, Ur, LPF, POC: NEGATIVE
Crystals, Ur, HPF, POC: NEGATIVE
Mucus, UA: NEGATIVE
Yeast, UA: NEGATIVE

## 2013-02-12 LAB — POCT CBC
Hemoglobin: 13.2 g/dL (ref 12.2–16.2)
Lymph, poc: 1.9 (ref 0.6–3.4)
MCH, POC: 29.5 pg (ref 27–31.2)
MCHC: 31.8 g/dL (ref 31.8–35.4)
MID (cbc): 0.4 (ref 0–0.9)
MPV: 9.5 fL (ref 0–99.8)
POC Granulocyte: 2.5 (ref 2–6.9)
POC LYMPH PERCENT: 39.7 %L (ref 10–50)
Platelet Count, POC: 256 10*3/uL (ref 142–424)

## 2013-02-12 LAB — LIPID PANEL
Cholesterol: 230 mg/dL — ABNORMAL HIGH (ref 0–200)
HDL: 36 mg/dL — ABNORMAL LOW (ref >39)
Total CHOL/HDL Ratio: 6.4 Ratio

## 2013-02-12 LAB — IFOBT (OCCULT BLOOD): IFOBT: NEGATIVE

## 2013-02-12 LAB — TSH: TSH: 0.666 u[IU]/mL (ref 0.350–4.500)

## 2013-02-12 LAB — GLUCOSE, POCT (MANUAL RESULT ENTRY): POC Glucose: 100 mg/dl — AB (ref 70–99)

## 2013-02-12 MED ORDER — OMEPRAZOLE 20 MG PO CPDR: 20.0000 mg | DELAYED_RELEASE_CAPSULE | Freq: Every day | ORAL | Status: DC

## 2013-02-12 MED ORDER — TRAMADOL HCL 50 MG PO TABS: 50.0000 mg | ORAL_TABLET | Freq: Three times a day (TID) | ORAL | Status: DC | PRN

## 2013-02-12 MED ORDER — SUCRALFATE 1 G PO TABS: 1.0000 g | ORAL_TABLET | Freq: Four times a day (QID) | ORAL | Status: DC

## 2013-02-12 NOTE — Patient Instructions (Addendum)
Stop taking any NSAID medications.  Use the carafate  Your [facility] has authorized the MRI scan the approval number is [id number] you may go today for the scan at [facility] [address] . Arrive at 12:30 for the scan.

## 2013-02-12 NOTE — Progress Notes (Addendum)
[facility] [address], [place] [address] [phone]  Date:  02/12/2013   Name:  SHAKIARA Santiago   DOB:  May 07, 1951   MRN:  [id number]  PCP:  Abbe Amsterdam, MD    Chief Complaint: Annual Exam   History of Present Illness:  Peggy Santiago is a 62 y.o. very pleasant female patient who presents with the following:  Here today for a CPE, but converted to a regular visit as below.   History of obesity, high cholesterol, arthritis of her knees.   Married, 2 children.   Pap last year, normal. She has also had a hysterectomy   Mammogram last year, looked ok Colonoscopy at age 35, normal  She had a couple of pre- syncopal episodes yesterday.  She was standing up making breakfast- all of a sudden she felt like her vision changed, and she nearly fell down.  Her sister caught her.  She did not have actual LOC but nearly passed out.  She then "tried to go out again," she had to lie down in a chair.  She was not able to stand or move her feet on her own.   These sx lasted 10- 15 minutes, she then sat down to rest.  She felt like she was not able to speak clearly- however her family did not notice her slurring her speech. It was more that she had trouble finding her words.  No CP, no SOB   She drank some gatorade and felt better.  She felt better after a couple of hours, but still does not feel 100% well.   Her entire body felt weak and numb, but she did not have any particular weakness.   She did have a HA 2 days ago, nothing unusual  She can not think of anything different going on in her life.  No new medicatoins However, she has felt very tired for the last month or so.  Her husband points out that the family has been under some stress, but Abbe does not feel that it is getting to her too much.  She will be ready for bed right after work- this is not like her.   She did have a HA on Saturday, but this is now better.    She had a similar problem a couple of  years ago, and was dx with dehydration.   No fever, no cough, no ST.   She does have night sweats but this has gone on for some time as part of menopause.   She did have a partial hyst, still has one ovary.  No nausea or vomiting, no recent weight changes.   No constipation or diarrhea.  No melena or blood in her stool  She has generally been healthy.    Patient Active Problem List   Diagnosis Date Noted  . Carpal tunnel syndrome 02/02/2013  . Hypercholesterolemia 04/17/2012    Past Medical History  Diagnosis Date  . Allergy   . Chronic kidney disease     kidney stones  . Osteoarthritis of knee   . Osteoarthritis of knee   . Anemia     Past Surgical History  Procedure Laterality Date  . Appendectomy    . Cholecystectomy      History  Substance Use Topics  . Smoking status: Never Smoker   . Smokeless tobacco: Not on file  . Alcohol Use: No    Family History  Problem Relation Age of Onset  . Heart  disease Mother   . Heart disease Father   . Diabetes Sister   . Diabetes Sister   . Heart disease Sister   . Cancer Brother     Allergies  Allergen Reactions  . Contrast Media (Iodinated Diagnostic Agents)     Medication list has been reviewed and updated.  Current Outpatient Prescriptions on File Prior to Visit  Medication Sig Dispense Refill  . celecoxib (CELEBREX) 200 MG capsule Take 200 mg by mouth 2 (two) times daily.      . cetirizine (ZYRTEC) 10 MG tablet Take 20 mg by mouth daily.      . cyanocobalamin (,VITAMIN B-12,) 1000 MCG/ML injection Inject 1 mL (1,000 mcg total) into the muscle once. monthly  1 mL  5  . fish oil-omega-3 fatty acids 1000 MG capsule Take 2 g by mouth daily.      . Ginkgo Biloba (GINKOBA) 40 MG TABS Take by mouth.      Marland Kitchen HYDROcodone-acetaminophen (NORCO/VICODIN) 5-325 MG per tablet Take 1 tablet by mouth every 6 (six) hours as needed for pain.  20 tablet  0  . omeprazole (PRILOSEC) 10 MG capsule Take 10 mg by mouth daily.      Marland Kitchen  amitriptyline (ELAVIL) 25 MG tablet Take 1 tablet (25 mg total) by mouth at bedtime.  90 tablet  0  . traMADol (ULTRAM) 50 MG tablet Take 1 tablet (50 mg total) by mouth every 8 (eight) hours as needed for pain.  30 tablet  1   No current facility-administered medications on file prior to visit.    Review of Systems:  As per HPI- otherwise negative. History of ulcer in the past, admits she has been taking some NSAIDs recently for arthritis pain  Physical Examination: Filed Vitals:   02/12/13 0844  BP: 132/80  Pulse: 68  Temp: 98.2 F (36.8 C)  Resp: 18   Filed Vitals:   02/12/13 0844  Height: 5' 1.5" (1.562 m)  Weight: 177 lb 12.8 oz (80.65 kg)   Body mass index is 33.06 kg/(m^2). Ideal Body Weight: Weight in (lb) to have BMI = 25: 134.2  GEN: WDWN, NAD, Non-toxic, A & O x 3, overweight HEENT: Atraumatic, Normocephalic. Neck supple. No masses, No LAD.  Bilateral TM wnl, oropharynx normal.  PEERL,EOMI.  No carotid bruit  Ears and Nose: No external deformity. CV: RRR, No M/G/R. No JVD. No thrill. No extra heart sounds. PULM: CTA B, no wheezes, crackles, rhonchi. No retractions. No resp. distress. No accessory muscle use. ABD: S, ND, +BS. No rebound. No HSM.  Epigastric tenderness. Mild RUQ tenderness- s/p cholecystectomy  EXTR: No c/c/e NEURO Normal gait. Normal strength, sensation and DTR in all extremities.  Normal finger/ nose testing. Wobbles but does not move her feet on Romberg testing.   PSYCH: Normally interactive. Conversant. Not depressed or anxious appearing.  Calm demeanor.   Results for orders placed in visit on 02/12/13  POCT CBC      Result Value Range   WBC 4.8  4.6 - 10.2 K/uL   Lymph, poc 1.9  0.6 - 3.4   POC LYMPH PERCENT 39.7  10 - 50 %L   MID (cbc) 0.4  0 - 0.9   POC MID % 7.4  0 - 12 %M   POC Granulocyte 2.5  2 - 6.9   Granulocyte percent 52.9  37 - 80 %G   RBC 4.47  4.04 - 5.48 M/uL   Hemoglobin 13.2  12.2 - 16.2 g/dL  HCT, POC 41.5  37.7 -  47.9 %   MCV 92.9  80 - 97 fL   MCH, POC 29.5  27 - 31.2 pg   MCHC 31.8  31.8 - 35.4 g/dL   RDW, POC 40.9     Platelet Count, POC 256  142 - 424 K/uL   MPV 9.5  0 - 99.8 fL  GLUCOSE, POCT (MANUAL RESULT ENTRY)      Result Value Range   POC Glucose 100 (*) 70 - 99 mg/dl  POCT UA - MICROSCOPIC ONLY      Result Value Range   WBC, Ur, HPF, POC 0-1     RBC, urine, microscopic 0-1     Bacteria, U Microscopic trace     Mucus, UA neg     Epithelial cells, urine per micros 1-3     Crystals, Ur, HPF, POC neg     Casts, Ur, LPF, POC neg     Yeast, UA neg    POCT URINALYSIS DIPSTICK      Result Value Range   Color, UA yellow     Clarity, UA clear     Glucose, UA neg     Bilirubin, UA neg     Ketones, UA neg     Spec Grav, UA 1.010     Blood, UA trace-intact     pH, UA 5.5     Protein, UA neg     Urobilinogen, UA 0.2     Nitrite, UA neg     Leukocytes, UA Negative    IFOBT (OCCULT BLOOD)      Result Value Range   IFOBT Negative     [facility] reading (PRIMARY) by  Dr. Patsy Lager. CXR: negative  CHEST - 2 VIEW  Comparison: None  Findings: Heart size and vascularity are normal. Negative for pneumonia. Negative for mass lesion or effusion.  IMPRESSION: Negative  Clinically significant discrepancy from primary report, if provided: None  EKG: NSR- mild bradycardia, no ST elelvation or depression   Assessment and Plan: Abdominal  pain, other specified site - Plan: POCT CBC, Comprehensive metabolic panel, POCT glucose (manual entry), POCT UA - Microscopic Only, POCT urinalysis dipstick, IFOBT POC (occult bld, rslt in office), sucralfate (CARAFATE) 1 G tablet, omeprazole (PRILOSEC) 20 MG capsule  Pre-syncope - Plan: EKG 12-Lead, DG Chest 2 View, TSH, MR Brain Wo Contrast  High cholesterol - Plan: Lipid panel  Knee pain, unspecified laterality - Plan: traMADol (ULTRAM) 50 MG tablet  Speech abnormality  Mckayla is here today with pre- syncopal sx yesterday.  DDX: TIA/ other  neurologic issue, cardiac, vagal reaction perhaps due to GERD, stress.  At this time her exam and labs are reassuring.  Plan to perform an MRI of her brain today.  Also, she is having some epigastric pains which are likely due to overuse of NSAID medications.  Refilled her PRN tramadol and encouraged her to use this for her OA pains instead of NSAIDS and celebrex for the time being.  Increase her prilosec dose to 20 mg, and added carafate  Await the rest of her labs, check FLP  [place] Abbe [place]  [phone]- [place] cell phone number (husband)  Hand appt at 2:30 pm today, Dr. Amanda Pea re CTS  Were able to arrange head MRI at 12:30 at GI, [address]  Pt alerted and she will go to this appt Called back with MRI result: MRI HEAD WITHOUT CONTRAST  Technique: Multiplanar, multiecho pulse sequences of the brain and  surrounding structures were obtained according to standard protocol without intravenous contrast.  Comparison: Brain MRI 12/18/2009.  Findings: Cerebral volume has not significantly changed and is within normal limits for age. No restricted diffusion to suggest acute infarction. No midline shift, mass effect, evidence of mass lesion, ventriculomegaly, extra-axial collection or acute intracranial hemorrhage. Cervicomedullary junction and pituitary are within normal limits. Negative visualized cervical spine.  Major intracranial vascular flow voids are stable. The gray and white matter signal is stable and within normal limits for age throughout the brain.  Mild susceptibility artifact re-identified near the bridge of the left nose. Visualized orbit soft tissues are within normal limits. Negative paranasal sinuses and mastoids. Normal bone marrow signal. Negative scalp soft tissues.  IMPRESSION: Stable and normal for age noncontrast MRI appearance of the brain.  Discussed with her.  Her MRI is normal, which is good. We still do not have a definite reason for her  sx yesterday.  She does have more labs coming in which I expect tomorrow. Discussed cardiology referral vs neurology referral vs having her go to the hospital for further evaluation.  At this time she prefers to await the rest of her labs.  If any sx return she will seek care, and I will be in touch regarding her labs   02/13/13.  Received the rest of her labs-   Results for orders placed in visit on 02/12/13  COMPREHENSIVE METABOLIC PANEL      Result Value Range   Sodium 142  135 - 145 mEq/L   Potassium 4.6  3.5 - 5.3 mEq/L   Chloride 108  96 - 112 mEq/L   CO2 27  19 - 32 mEq/L   Glucose, Bld 111 (*) 70 - 99 mg/dL   BUN 15  6 - 23 mg/dL   Creat 1.61  0.96 - 0.45 mg/dL   Total Bilirubin 0.5  0.3 - 1.2 mg/dL   Alkaline Phosphatase 64  39 - 117 U/L   AST 15  0 - 37 U/L   ALT 16  0 - 35 U/L   Total Protein 7.1  6.0 - 8.3 g/dL   Albumin 4.4  3.5 - 5.2 g/dL   Calcium 9.7  8.4 - 40.9 mg/dL  TSH      Result Value Range   TSH 0.666  0.350 - 4.500 uIU/mL  LIPID PANEL      Result Value Range   Cholesterol 230 (*) 0 - 200 mg/dL   Triglycerides 811 (*) <150 mg/dL   HDL 36 (*) >91 mg/dL   Total CHOL/HDL Ratio 6.4     VLDL 36  0 - 40 mg/dL   LDL Cholesterol 478 (*) 0 - 99 mg/dL  POCT CBC      Result Value Range   WBC 4.8  4.6 - 10.2 K/uL   Lymph, poc 1.9  0.6 - 3.4   POC LYMPH PERCENT 39.7  10 - 50 %L   MID (cbc) 0.4  0 - 0.9   POC MID % 7.4  0 - 12 %M   POC Granulocyte 2.5  2 - 6.9   Granulocyte percent 52.9  37 - 80 %G   RBC 4.47  4.04 - 5.48 M/uL   Hemoglobin 13.2  12.2 - 16.2 g/dL   HCT, POC 29.5  62.1 - 47.9 %   MCV 92.9  80 - 97 fL   MCH, POC 29.5  27 - 31.2 pg   MCHC 31.8  31.8 - 35.4 g/dL  RDW, POC 13.6     Platelet Count, POC 256  142 - 424 K/uL   MPV 9.5  0 - 99.8 fL  GLUCOSE, POCT (MANUAL RESULT ENTRY)      Result Value Range   POC Glucose 100 (*) 70 - 99 mg/dl  POCT UA - MICROSCOPIC ONLY      Result Value Range   WBC, Ur, HPF, POC 0-1     RBC, urine, microscopic  0-1     Bacteria, U Microscopic trace     Mucus, UA neg     Epithelial cells, urine per micros 1-3     Crystals, Ur, HPF, POC neg     Casts, Ur, LPF, POC neg     Yeast, UA neg    POCT URINALYSIS DIPSTICK      Result Value Range   Color, UA yellow     Clarity, UA clear     Glucose, UA neg     Bilirubin, UA neg     Ketones, UA neg     Spec Grav, UA 1.010     Blood, UA trace-intact     pH, UA 5.5     Protein, UA neg     Urobilinogen, UA 0.2     Nitrite, UA neg     Leukocytes, UA Negative    IFOBT (OCCULT BLOOD)      Result Value Range   IFOBT Negative     She was fasting for labs. Needs follow- up of borderline glucose, and cholesterol medication rx if she is willing.  Will send message to Amy/ Britta Mccreedy to try and contact her in the next couple of days.    8/15- called to discuss with her.  She is feeling well.  Discussed her borderline blood sugar and elevated cholesterol.  She has taken a statin in the past and had trouble with muscle aches.  Would prefer to manage both of these issues with diet and exercise changes.  She will work on a lower fat diet and weight loss, and come see me in about 2 months for a recheck.

## 2013-02-14 ENCOUNTER — Other Ambulatory Visit: Payer: Self-pay | Admitting: Radiology

## 2013-02-14 MED ORDER — PRAVASTATIN SODIUM 40 MG PO TABS: 40.0000 mg | ORAL_TABLET | Freq: Every day | ORAL | Status: DC

## 2013-02-22 ENCOUNTER — Encounter: Payer: Self-pay | Admitting: Family Medicine

## 2013-05-15 ENCOUNTER — Other Ambulatory Visit: Payer: Self-pay | Admitting: Physician Assistant

## 2013-06-06 ENCOUNTER — Telehealth: Payer: Self-pay

## 2013-06-06 MED ORDER — CELECOXIB 200 MG PO CAPS: 200.0000 mg | ORAL_CAPSULE | Freq: Two times a day (BID) | ORAL | Status: DC

## 2013-06-06 NOTE — Telephone Encounter (Signed)
Note in system indicates Rx denied, pt changed to Tramadol 02/2013 d/t GI problems, called her. She states Celebrex was not causing her problems with her stomach, she has taken for a couple years, for her knees. She would rather use the Celebrex, not the tramadol. She states she passed a kidney stone in August, this was the cause of her GI problems at the time, not Celebrex.  Pended please advise.

## 2013-06-06 NOTE — Telephone Encounter (Signed)
Patient advised.

## 2013-06-06 NOTE — Telephone Encounter (Signed)
There is documentation of some epigastric pain from her OV in August this is why the Celebrex was stopped. If she would like to restart this I am ok with that in the short term. If she develops epigastric pain stop the Celebrex.

## 2013-06-06 NOTE — Telephone Encounter (Signed)
Patient calling to say that the pharmacy is having an issue with her celebrex and would like a nurse to call her about this please call [phone]

## 2013-08-02 ENCOUNTER — Encounter: Payer: Self-pay | Admitting: Family Medicine

## 2013-08-02 ENCOUNTER — Ambulatory Visit: Payer: 59

## 2013-08-02 ENCOUNTER — Ambulatory Visit (INDEPENDENT_AMBULATORY_CARE_PROVIDER_SITE_OTHER): Payer: 59 | Admitting: Family Medicine

## 2013-08-02 VITALS — BP 120/76 | HR 86 | Temp 98.6°F | Resp 16 | Ht 62.0 in | Wt 167.0 lb

## 2013-08-02 DIAGNOSIS — R110 Nausea: Secondary | ICD-10-CM

## 2013-08-02 DIAGNOSIS — R109 Unspecified abdominal pain: Secondary | ICD-10-CM

## 2013-08-02 DIAGNOSIS — K5900 Constipation, unspecified: Secondary | ICD-10-CM

## 2013-08-02 LAB — POCT URINALYSIS DIPSTICK
Bilirubin, UA: NEGATIVE
Glucose, UA: NEGATIVE
Ketones, UA: NEGATIVE
Leukocytes, UA: NEGATIVE
Nitrite, UA: NEGATIVE
Protein, UA: NEGATIVE
Spec Grav, UA: >=1.03
Urobilinogen, UA: 0.2
pH, UA: 5.5

## 2013-08-02 LAB — POCT UA - MICROSCOPIC ONLY
Casts, Ur, LPF, POC: NEGATIVE
Crystals, Ur, HPF, POC: NEGATIVE
Yeast, UA: NEGATIVE

## 2013-08-02 LAB — POCT CBC
Granulocyte percent: 49.2 %G (ref 37–80)
HCT, POC: 40.5 % (ref 37.7–47.9)
Hemoglobin: 12.5 g/dL (ref 12.2–16.2)
Lymph, poc: 2.7 (ref 0.6–3.4)
MCH, POC: 29.1 pg (ref 27–31.2)
MCHC: 30.9 g/dL — AB (ref 31.8–35.4)
MCV: 94.3 fL (ref 80–97)
MID (cbc): 0.4 (ref 0–0.9)
MPV: 8.9 fL (ref 0–99.8)
POC Granulocyte: 3 (ref 2–6.9)
POC LYMPH PERCENT: 44.9 % (ref 10–50)
POC MID %: 5.9 % (ref 0–12)
Platelet Count, POC: 221 10*3/uL (ref 142–424)
RBC: 4.29 M/uL (ref 4.04–5.48)
RDW, POC: 14.1 %
WBC: 6.1 10*3/uL (ref 4.6–10.2)

## 2013-08-02 NOTE — Progress Notes (Signed)
Chief Complaint:  Chief Complaint  Patient presents with  . Abdominal Pain    loose stools nausea headache x 4 days    HPI: Peggy Santiago is a 63 y.o. female who is here for abdominal pain. The pain started on Tuesday. She had some food at a different restaurant for lunch, no one else has had this problem. Symptoms started with nausea. She has not been able to eat much,  she is not getting any better. She states that she has pain everywhere in mid section  and it feels like her stomach is in a knot. She has not had any diarrhea, but some loose stools, small BMs, not really formes, her stomach is distended. No vomiting, no fever, the pain gets worse when she tries to eat or drink anything. She states she has had a headache as well for the last couple days. She has acid reflux  And took 2 PPI  this morning. She drank water and her stomach was in knots. No blood  Loose stools. Last colonoscopy  5 years ago ,had polyps but nothing else. Last BM was this morning, she had 4 episodes of loose stools yesterday. She has well water. She has had mutiple abd surgeries and also has ahd SBO in the past where they had to resect a part of her colon.   Past Medical History  Diagnosis Date  . Allergy   . Chronic kidney disease     kidney stones  . Osteoarthritis of knee   . Osteoarthritis of knee   . Anemia    Past Surgical History  Procedure Laterality Date  . Appendectomy    . Cholecystectomy    . Abdominal hysterectomy      Partial   History   Social History  . Marital Status: Married    Spouse Name: N/A    Number of Children: N/A  . Years of Education: N/A   Social History Main Topics  . Smoking status: Never Smoker   . Smokeless tobacco: None  . Alcohol Use: No  . Drug Use: No  . Sexual Activity: Yes    Birth Control/ Protection: Surgical   Other Topics Concern  . None   Social History Narrative  . None   Family History  Problem Relation Age of Onset  . Heart  disease Mother   . Heart disease Father   . Diabetes Sister   . Diabetes Sister   . Heart disease Sister   . Cancer Brother    Allergies  Allergen Reactions  . Contrast Media [Iodinated Diagnostic Agents]    Prior to Admission medications   Medication Sig Start Date End Date Taking? Authorizing Provider  celecoxib (CELEBREX) 200 MG capsule Take 1 capsule (200 mg total) by mouth 2 (two) times daily. 06/06/13  Yes Ryan M Dunn, PA-C  cetirizine (ZYRTEC) 10 MG tablet Take 20 mg by mouth daily.   Yes Historical Provider, MD  fish oil-omega-3 fatty acids 1000 MG capsule Take 2 g by mouth daily.   Yes Historical Provider, MD  Ginkgo Biloba (GINKOBA) 40 MG TABS Take by mouth.   Yes Historical Provider, MD  omeprazole (PRILOSEC) 20 MG capsule Take 1 capsule (20 mg total) by mouth daily. 02/12/13  Yes Gay Filler Copland, MD  cyanocobalamin (,VITAMIN B-12,) 1000 MCG/ML injection Inject 1 mL (1,000 mcg total) into the muscle once. monthly 04/17/12   Argentina Donovan, PA-C  pravastatin (PRAVACHOL) 40 MG tablet Take 1 tablet (40  mg total) by mouth daily. 02/14/13   Gay Filler Copland, MD  sucralfate (CARAFATE) 1 G tablet Take 1 tablet (1 g total) by mouth 4 (four) times daily. 02/12/13   Gay Filler Copland, MD  traMADol (ULTRAM) 50 MG tablet Take 1 tablet (50 mg total) by mouth every 8 (eight) hours as needed for pain. 02/12/13   Gay Filler Copland, MD     ROS: The patient denies fevers, chills, night sweats, unintentional weight loss, chest pain, palpitations, wheezing, dyspnea on exertion,  dysuria, hematuria, melena, numbness, weakness, or tingling.   All other systems have been reviewed and were otherwise negative with the exception of those mentioned in the HPI and as above.    PHYSICAL EXAM: Filed Vitals:   08/02/13 1701  BP: 120/76  Pulse: 86  Temp: 98.6 F (37 C)  Resp: 16   Filed Vitals:   08/02/13 1701  Height: 5\' 2"  (1.575 m)  Weight: 167 lb (75.751 kg)   Body mass index is 30.54  kg/(m^2).  General: Alert, no acute distress HEENT:  Normocephalic, atraumatic, oropharynx patent. EOMI, PERRLA Cardiovascular:  Regular rate and rhythm, no rubs murmurs or gallops.  No Carotid bruits, radial pulse intact. No pedal edema.  Respiratory: Clear to auscultation bilaterally.  No wheezes, rales, or rhonchi.  No cyanosis, no use of accessory musculature GI: No organomegaly, abdomen is soft and +-tenderness diffusely in mid section of abd bialterally, positive bowel sounds.  No masses. Neg peritoneal signs Skin: No rashes. Neurologic: Facial musculature symmetric. Psychiatric: Patient is appropriate throughout our interaction. Lymphatic: No cervical lymphadenopathy Musculoskeletal: Gait intact.   LABS: Results for orders placed in visit on 08/02/13  POCT UA - MICROSCOPIC ONLY      Result Value Range   WBC, Ur, HPF, POC 0-2     RBC, urine, microscopic 0-2     Bacteria, U Microscopic trace     Mucus, UA trace     Epithelial cells, urine per micros 1-3     Crystals, Ur, HPF, POC neg     Casts, Ur, LPF, POC neg     Yeast, UA neg    POCT URINALYSIS DIPSTICK      Result Value Range   Color, UA yellow     Clarity, UA clear     Glucose, UA neg     Bilirubin, UA neg     Ketones, UA neg     Spec Grav, UA >=1.030     Blood, UA trace     pH, UA 5.5     Protein, UA neg     Urobilinogen, UA 0.2     Nitrite, UA neg     Leukocytes, UA Negative    POCT CBC      Result Value Range   WBC 6.1  4.6 - 10.2 K/uL   Lymph, poc 2.7  0.6 - 3.4   POC LYMPH PERCENT 44.9  10 - 50 %L   MID (cbc) 0.4  0 - 0.9   POC MID % 5.9  0 - 12 %M   POC Granulocyte 3.0  2 - 6.9   Granulocyte percent 49.2  37 - 80 %G   RBC 4.29  4.04 - 5.48 M/uL   Hemoglobin 12.5  12.2 - 16.2 g/dL   HCT, POC 40.5  37.7 - 47.9 %   MCV 94.3  80 - 97 fL   MCH, POC 29.1  27 - 31.2 pg   MCHC 30.9 (*) 31.8 - 35.4 g/dL  RDW, POC 14.1     Platelet Count, POC 221  142 - 424 K/uL   MPV 8.9  0 - 99.8 fL     EKG/XRAY:    Primary read interpreted by Dr. Marin Comment at [facility]. Normal chest + mod stool burden No free air, obstruction   ASSESSMENT/PLAN: Encounter Diagnoses  Name Primary?  . Abdominal pain, unspecified site Yes  . Nausea alone   . Unspecified constipation    Abd pain and bloating due to constipation, ?renal stone involvement since she has a history of them and also some blood in her urine today. She states it does not feel like her usual kidney stone pain. I do not see any renal stones or urethral stone on xray. There are phleboliths in baldder.  Miralax BID, Colace. Consider mag citrate if no improvement CMP pending Push fluids BRAT  Take zofran that you already have at home prn nause.  F/u in 48 hrs by phone  Gross sideeffects, risk and benefits, and alternatives of medications d/w patient. Patient is aware that all medications have potential sideeffects and we are unable to predict every sideeffect or drug-drug interaction that may occur.  Leotis Pain, DO 08/02/2013 6:31 PM

## 2013-08-02 NOTE — Patient Instructions (Signed)

## 2013-08-03 ENCOUNTER — Encounter: Payer: Self-pay | Admitting: Family Medicine

## 2013-08-03 LAB — COMPREHENSIVE METABOLIC PANEL
AST: 18 U/L (ref 0–37)
Albumin: 4.4 g/dL (ref 3.5–5.2)
Alkaline Phosphatase: 57 U/L (ref 39–117)
BUN: 15 mg/dL (ref 6–23)
Calcium: 9.7 mg/dL (ref 8.4–10.5)
Chloride: 105 mEq/L (ref 96–112)
Glucose, Bld: 96 mg/dL (ref 70–99)
Potassium: 4 mEq/L (ref 3.5–5.3)

## 2013-08-03 LAB — COMPREHENSIVE METABOLIC PANEL WITH GFR
ALT: 22 U/L (ref 0–35)
CO2: 26 meq/L (ref 19–32)
Creat: 0.84 mg/dL (ref 0.50–1.10)
Sodium: 141 meq/L (ref 135–145)
Total Bilirubin: 0.8 mg/dL (ref 0.3–1.2)
Total Protein: 7 g/dL (ref 6.0–8.3)

## 2013-08-20 ENCOUNTER — Other Ambulatory Visit: Payer: Self-pay | Admitting: Physician Assistant

## 2013-09-04 ENCOUNTER — Other Ambulatory Visit: Payer: Self-pay

## 2013-09-04 DIAGNOSIS — M179 Osteoarthritis of knee, unspecified: Secondary | ICD-10-CM

## 2013-09-04 DIAGNOSIS — M1710 Unilateral primary osteoarthritis, unspecified knee: Secondary | ICD-10-CM

## 2013-09-04 MED ORDER — TRAMADOL HCL 50 MG PO TABS: 50.0000 mg | ORAL_TABLET | Freq: Three times a day (TID) | ORAL | Status: DC | PRN

## 2013-09-04 NOTE — Telephone Encounter (Signed)
Pharm requests RF of tramadol 50. Pended.

## 2013-11-30 ENCOUNTER — Other Ambulatory Visit: Payer: Self-pay | Admitting: Physician Assistant

## 2013-12-24 ENCOUNTER — Encounter: Payer: Self-pay | Admitting: Family Medicine

## 2013-12-24 ENCOUNTER — Ambulatory Visit (INDEPENDENT_AMBULATORY_CARE_PROVIDER_SITE_OTHER): Payer: 59 | Admitting: Family Medicine

## 2013-12-24 VITALS — BP 130/80 | HR 63 | Temp 99.2°F | Resp 16 | Ht 61.5 in | Wt 175.4 lb

## 2013-12-24 DIAGNOSIS — B354 Tinea corporis: Secondary | ICD-10-CM

## 2013-12-24 DIAGNOSIS — E538 Deficiency of other specified B group vitamins: Secondary | ICD-10-CM

## 2013-12-24 DIAGNOSIS — M179 Osteoarthritis of knee, unspecified: Secondary | ICD-10-CM

## 2013-12-24 DIAGNOSIS — IMO0002 Reserved for concepts with insufficient information to code with codable children: Secondary | ICD-10-CM

## 2013-12-24 DIAGNOSIS — M1710 Unilateral primary osteoarthritis, unspecified knee: Secondary | ICD-10-CM

## 2013-12-24 MED ORDER — TRAMADOL HCL 50 MG PO TABS: 50.0000 mg | ORAL_TABLET | Freq: Three times a day (TID) | ORAL | Status: DC | PRN

## 2013-12-24 MED ORDER — CELECOXIB 200 MG PO CAPS: 200.0000 mg | ORAL_CAPSULE | Freq: Two times a day (BID) | ORAL | Status: DC

## 2013-12-24 MED ORDER — CYANOCOBALAMIN 1000 MCG/ML IJ SOLN: 1000.0000 ug | Freq: Once | INTRAMUSCULAR | Status: DC

## 2013-12-24 NOTE — Progress Notes (Signed)
[facility] [address] [place] [address] [phone]  Date:  12/24/2013   Name:  Peggy Santiago   DOB:  05-22-1951   MRN:  [id number]  PCP:  Lamar Blinks, MD    Chief Complaint: Medication Refill   History of Present Illness:  Peggy Santiago is a 63 y.o. very pleasant female patient who presents with the following:  Here today for refill of medications which she uses for chronic OA of her knees.  She uses celebrex BID, occasional tramadol.  Also she notes an uncomfortable rash under her left breast for the last couple of days.  It has been quite hot and she admits she has been sweating.   She never did take her pravachol regularly.  She tried it a couple of times and "felt like I had the flu" so she stopped.  She did not have any hives, swelling or rash, but just felt achy.  She does understand that a statin would be a good idea for her and would be willing to try a lower dose  Patient Active Problem List   Diagnosis Date Noted  . Carpal tunnel syndrome 02/02/2013  . Hypercholesterolemia 04/17/2012    Past Medical History  Diagnosis Date  . Allergy   . Chronic kidney disease     kidney stones  . Osteoarthritis of knee   . Osteoarthritis of knee   . Anemia     Past Surgical History  Procedure Laterality Date  . Appendectomy    . Cholecystectomy    . Abdominal hysterectomy      Partial  . Colon resection due to sbo      History  Substance Use Topics  . Smoking status: Never Smoker   . Smokeless tobacco: Not on file  . Alcohol Use: No    Family History  Problem Relation Age of Onset  . Heart disease Mother   . Heart disease Father   . Diabetes Sister   . Diabetes Sister   . Heart disease Sister   . Cancer Brother     Allergies  Allergen Reactions  . Contrast Media [Iodinated Diagnostic Agents] Hives and Rash    ALL OVER THE BODY    Medication list has been reviewed and updated.  Current Outpatient Prescriptions on  File Prior to Visit  Medication Sig Dispense Refill  . cetirizine (ZYRTEC) 10 MG tablet Take 20 mg by mouth daily.      . fish oil-omega-3 fatty acids 1000 MG capsule Take 2 g by mouth daily.      . Ginkgo Biloba (GINKOBA) 40 MG TABS Take by mouth.      Marland Kitchen omeprazole (PRILOSEC) 20 MG capsule Take 1 capsule (20 mg total) by mouth daily.  30 capsule  3  . sucralfate (CARAFATE) 1 G tablet Take 1 tablet (1 g total) by mouth 4 (four) times daily.  40 tablet  0   No current facility-administered medications on file prior to visit.    Review of Systems:  As per HPI- otherwise negative.   Physical Examination: Filed Vitals:   12/24/13 1130  BP: 130/80  Pulse: 63  Temp: 99.2 F (37.3 C)  Resp: 16   Filed Vitals:   12/24/13 1130  Height: 5' 1.5" (1.562 m)  Weight: 175 lb 6.4 oz (79.561 kg)   Body mass index is 32.61 kg/(m^2). Ideal Body Weight: Weight in (lb) to have BMI = 25: 134.2  GEN: WDWN, NAD, Non-toxic, A & O  x 3 HEENT: Atraumatic, Normocephalic. Neck supple. No masses, No LAD. Ears and Nose: No external deformity. CV: RRR, No M/G/R. No JVD. No thrill. No extra heart sounds. PULM: CTA B, no wheezes, crackles, rhonchi. No retractions. No resp. distress. No accessory muscle use. ABD: S, NT, ND, +BS. No rebound. No HSM. EXTR: No c/c/e.  Crepitus of both knees, no effusion NEURO Normal gait.  PSYCH: Normally interactive. Conversant. Not depressed or anxious appearing.  Calm demeanor.  There is an irritated rash that appears due to yeast under just the left breast.  The right breast is ok.  Despite being on just on side this rash does not appear suspicious for shingles.  It is scattered, papular and symmetrical under the fold of the breast  Assessment and Plan: Osteoarthritis of knee - Plan: traMADol (ULTRAM) 50 MG tablet, celecoxib (CELEBREX) 200 MG capsule  Low vitamin B12 level - Plan: cyanocobalamin (,VITAMIN B-12,) 1000 MCG/ML injection  Tinea corporis  Refilled her  medications that she uses for OA of her knees  Refilled vit B12- she would like to check this level at her next visit Went over conservative measures for her tinea corporis as per pt instruction Will try a lower dose of pravachol.  If not able to tolerate she will DC use   Signed Lamar Blinks, MD

## 2013-12-24 NOTE — Patient Instructions (Signed)
Keep the area under your breast very dry for the next few days.  Avoid getting hot and sweaty, and use your hair dryer on cool to dry the area very thoroughly.   Use an over the counter yeast cream such as lamisil or lotromin twice a day.  You might also try a diaper cream (not ointment) such as desitin.  Let me know if this is not better, worse, or spreading onto your back.   Please try taking a little of your pravachol- perhaps 1/2 tablet every 1 or 2 days.  If you are able to tolerate this we can plan to recheck fasting cholesterol in 3 or 4 months.  Let's also do a vitamin B12 level then as well

## 2013-12-27 ENCOUNTER — Telehealth: Payer: Self-pay

## 2013-12-27 MED ORDER — KETOCONAZOLE 2 % EX CREA: TOPICAL_CREAM | CUTANEOUS | Status: DC

## 2013-12-27 NOTE — Telephone Encounter (Signed)
Patient states she saw Dr. Lorelei Pont on Monday. States Dr. Lorelei Pont advised her to call back if she was not feeling better. Patient states she is not feeling any better and wants to know what she can do. Please return call and advise.

## 2013-12-27 NOTE — Telephone Encounter (Signed)
Meds ordered this encounter  Medications  . ketoconazole (NIZORAL) 2 % cream    Sig: Apply a thin layer to the affected area TID until rash is gone, and then an additional 5 days.    Dispense:  60 g    Refill:  0    Order Specific Question:  Supervising Provider    Answer:  DOOLITTLE, ROBERT P [[id number]]

## 2013-12-27 NOTE — Telephone Encounter (Signed)
Patient states she was seen by Dr. Lorelei Pont on Monday and the rash has gotten alittle better and she does have some on her lower abdomen.  She has been using the cream but it does not seem to be working.  She wants to know if there is something else she can use.  She uses [employer].  Please advise.

## 2013-12-30 ENCOUNTER — Telehealth: Payer: Self-pay

## 2013-12-30 NOTE — Telephone Encounter (Signed)
PATIENT STATES DR. Lorelei Pont TREATED HER ABOUT 1 WEEK AGO FOR A RASH. SHE CALLED TO GET ANOTHER MEDICATION BECAUSE THE FIRST ONE WAS NOT HELPING. CHELLE CALLED HER IN SOME KETOCONAZOLE CREAM AND TOLD HER TO CALL BACK IF THAT DID NOT HELP. THE PATIENT SAYS THE RASH IS UNDER 1 BREAST AND SPREADING TO THE OTHER ONE. SHE WOULD LIKE TO TRY SOMETHING ELSE. SHE SAID SHE HAS NOT BEEN TO WORK ALL WEEK AND SHE NEEDS TO RETURN AS SOON AS POSSIBLE. BEST PHONE [phone] (HOME)   PHARMACY CHOICE IS [facility] ON [address].  [place]

## 2013-12-30 NOTE — Telephone Encounter (Signed)
The cream may take 2-4 weeks for the lesion to completely resolve.  Advise that she should continue to use the cream for at least 2 weeks and then RTC if the rash is getting no better.  I left this information on her voice mail.

## 2013-12-31 NOTE — Telephone Encounter (Signed)
Called her back- LMOM.  As her rash seems to be getting worse please come in to be seen asap for a re- eval

## 2014-01-02 ENCOUNTER — Ambulatory Visit (INDEPENDENT_AMBULATORY_CARE_PROVIDER_SITE_OTHER): Payer: 59 | Admitting: Family Medicine

## 2014-01-02 VITALS — BP 122/84 | HR 61 | Temp 98.6°F | Resp 16 | Ht 62.0 in | Wt 178.2 lb

## 2014-01-02 DIAGNOSIS — B354 Tinea corporis: Secondary | ICD-10-CM

## 2014-01-02 MED ORDER — TERBINAFINE HCL 250 MG PO TABS: 250.0000 mg | ORAL_TABLET | Freq: Every day | ORAL | Status: DC

## 2014-01-02 NOTE — Progress Notes (Signed)
[facility] [address], [place] [address] [phone]  Date:  01/02/2014   Name:  Peggy Santiago   DOB:  1951-06-07   MRN:  [id number]  PCP:  Lamar Blinks, MD    Chief Complaint: Follow-up   History of Present Illness:  Peggy Santiago is a 63 y.o. very pleasant female patient who presents with the following:  Here today for a recheck- she was here about 10 days ago with a rash under her left breast.  It appears consistent with tinea and we treated it with OTC antifungal cream.   She then called back with worsening sx and was treated with rx ketoconazole cream.   The rash is now on both sides, and does seem to be getting a "little bit better."  The rash is itchy and painful.  She did not work last week.   Patient Active Problem List   Diagnosis Date Noted  . Carpal tunnel syndrome 02/02/2013  . Hypercholesterolemia 04/17/2012    Past Medical History  Diagnosis Date  . Allergy   . Chronic kidney disease     kidney stones  . Osteoarthritis of knee   . Osteoarthritis of knee   . Anemia     Past Surgical History  Procedure Laterality Date  . Appendectomy    . Cholecystectomy    . Abdominal hysterectomy      Partial  . Colon resection due to sbo      History  Substance Use Topics  . Smoking status: Never Smoker   . Smokeless tobacco: Not on file  . Alcohol Use: No    Family History  Problem Relation Age of Onset  . Heart disease Mother   . Heart disease Father   . Diabetes Sister   . Diabetes Sister   . Heart disease Sister   . Cancer Brother     Allergies  Allergen Reactions  . Contrast Media [Iodinated Diagnostic Agents] Hives and Rash    ALL OVER THE BODY    Medication list has been reviewed and updated.  Current Outpatient Prescriptions on File Prior to Visit  Medication Sig Dispense Refill  . celecoxib (CELEBREX) 200 MG capsule Take 1 capsule (200 mg total) by mouth 2 (two) times daily. PATIENT NEEDS OFFICE  VISIT FOR ADDITIONAL REFILLS  180 capsule  3  . cetirizine (ZYRTEC) 10 MG tablet Take 20 mg by mouth daily.      . cyanocobalamin (,VITAMIN B-12,) 1000 MCG/ML injection Inject 1 mL (1,000 mcg total) into the muscle once. monthly  10 mL  3  . fish oil-omega-3 fatty acids 1000 MG capsule Take 2 g by mouth daily.      . Ginkgo Biloba (GINKOBA) 40 MG TABS Take by mouth.      Marland Kitchen ketoconazole (NIZORAL) 2 % cream Apply a thin layer to the affected area TID until rash is gone, and then an additional 5 days.  60 g  0  . omeprazole (PRILOSEC) 20 MG capsule Take 1 capsule (20 mg total) by mouth daily.  30 capsule  3  . sucralfate (CARAFATE) 1 G tablet Take 1 tablet (1 g total) by mouth 4 (four) times daily.  40 tablet  0  . traMADol (ULTRAM) 50 MG tablet Take 1 tablet (50 mg total) by mouth every 8 (eight) hours as needed.  90 tablet  1   No current facility-administered medications on file prior to visit.    Review of Systems:  As per  HPI- otherwise negative.   Physical Examination: Filed Vitals:   01/02/14 0911  BP: 122/84  Pulse: 61  Temp: 98.6 F (37 C)  Resp: 16   Filed Vitals:   01/02/14 0911  Height: 5\' 2"  (1.575 m)  Weight: 178 lb 3.2 oz (80.831 kg)   Body mass index is 32.58 kg/(m^2). Ideal Body Weight: Weight in (lb) to have BMI = 25: 136.4  GEN: WDWN, NAD, Non-toxic, A & O x 3, looks well HEENT: Atraumatic, Normocephalic. Neck supple. No masses, No LAD. No oral lesions Ears and Nose: No external deformity. CV: RRR, No M/G/R. No JVD. No thrill. No extra heart sounds. PULM: CTA B, no wheezes, crackles, rhonchi. No retractions. No resp. distress. No accessory muscle use. EXTR: No c/c/e NEURO Normal gait.  PSYCH: Normally interactive. Conversant. Not depressed or anxious appearing.  Calm demeanor.  There is a typical yeast rash under both breasts and a small similar spot under the right side of her pannus.  Otherwise no rash noted   Assessment and Plan: Tinea corporis - Plan:  terbinafine (LAMISIL) 250 MG tablet  Persistent tinea corporis.  Will add oral lamisil as topicals have not year cleared up her sx.   See patient instructions for more details.     Signed Lamar Blinks, MD

## 2014-01-02 NOTE — Patient Instructions (Signed)
We are going to add oral lamisil - one a day for 2 weeks.  You can continue the ketaconazole cream as well.  Please let me know if you do not see continued progress over the next 48 hours- Sooner if worse.   lamisil and tramadol can interact together- can either increase or decrease tramadol levels.  Use caution if you combine these medications.

## 2014-01-04 ENCOUNTER — Telehealth: Payer: Self-pay

## 2014-01-04 MED ORDER — DOXYCYCLINE HYCLATE 100 MG PO CAPS: 100.0000 mg | ORAL_CAPSULE | Freq: Two times a day (BID) | ORAL | Status: AC

## 2014-01-04 NOTE — Telephone Encounter (Signed)
Advised pt to come in to the office. Pt declines to come in. She states Dr. Lorelei Pont stated she would not have to come in that if she called and was worse she would get an antibiotic called in.

## 2014-01-04 NOTE — Telephone Encounter (Signed)
Dr. Lillie Fragmin notes do not indicate a plan to send in antibiotics if symptoms worsened/persisted.  I have reviewed the notes.  Meds ordered this encounter  Medications  . doxycycline (VIBRAMYCIN) 100 MG capsule    Sig: Take 1 capsule (100 mg total) by mouth 2 (two) times daily.    Dispense:  20 capsule    Refill:  0    Order Specific Question:  Supervising Provider    Answer:  DOOLITTLE, ROBERT P [[id number]    If symptoms not significantly improved in the next 48-72 hours, should RTC.

## 2014-01-04 NOTE — Telephone Encounter (Signed)
Pt was told that if rash did not get better to call us today. Patient says the rash is not getting better, she thinks it is getting worse.

## 2014-01-05 NOTE — Telephone Encounter (Signed)
LMOM that rx was called in and to RTC in 48/72 hrs if no improvement.

## 2014-02-22 ENCOUNTER — Ambulatory Visit (INDEPENDENT_AMBULATORY_CARE_PROVIDER_SITE_OTHER): Payer: 59 | Admitting: Family Medicine

## 2014-02-22 VITALS — BP 138/76 | HR 63 | Temp 99.2°F | Resp 16 | Ht 61.5 in | Wt 180.0 lb

## 2014-02-22 DIAGNOSIS — Z01810 Encounter for preprocedural cardiovascular examination: Secondary | ICD-10-CM

## 2014-02-22 DIAGNOSIS — E119 Type 2 diabetes mellitus without complications: Secondary | ICD-10-CM

## 2014-02-22 LAB — CBC
HEMATOCRIT: 36.7 % (ref 36.0–46.0)
Hemoglobin: 12.3 g/dL (ref 12.0–15.0)
MCH: 29.4 pg (ref 26.0–34.0)
MCHC: 33.5 g/dL (ref 30.0–36.0)
MCV: 87.6 fL (ref 78.0–100.0)
Platelets: 251 10*3/uL (ref 150–400)
RBC: 4.19 MIL/uL (ref 3.87–5.11)
RDW: 13.2 % (ref 11.5–15.5)
WBC: 6.6 10*3/uL (ref 4.0–10.5)

## 2014-02-22 NOTE — Progress Notes (Addendum)
[facility] [address], [place] [address] [phone]  Date:  02/22/2014   Name:  Peggy Santiago   DOB:  1951-04-04   MRN:  [id number]  PCP:  Lamar Blinks, MD    Chief Complaint: Consult   History of Present Illness:  Peggy Santiago is a 63 y.o. very pleasant female patient who presents with the following:  She is here today for a pre- op clearance.  She plans to have a right total knee in the next month or so.  She has suffered from pain in her knee for a long time and scope X2 has not been helpful.  She does not have DM, cardiac disease, HTN, or any renal issues.  Her knee has been more painful for the last 5 months so she can not do as much- however prior to this she was working with a Physiological scientist.  She was able to walk/ run on the treadmill for about 30 minutes.  She does not have a history of CP No CP with exercise.  No syncope.    Patient Active Problem List   Diagnosis Date Noted  . Carpal tunnel syndrome 02/02/2013  . Hypercholesterolemia 04/17/2012    Past Medical History  Diagnosis Date  . Allergy   . Chronic kidney disease     kidney stones  . Osteoarthritis of knee   . Osteoarthritis of knee   . Anemia     Past Surgical History  Procedure Laterality Date  . Appendectomy    . Cholecystectomy    . Abdominal hysterectomy      Partial  . Colon resection due to sbo      History  Substance Use Topics  . Smoking status: Never Smoker   . Smokeless tobacco: Not on file  . Alcohol Use: No    Family History  Problem Relation Age of Onset  . Heart disease Mother   . Heart disease Father   . Diabetes Sister   . Diabetes Sister   . Heart disease Sister   . Cancer Brother     Allergies  Allergen Reactions  . Contrast Media [Iodinated Diagnostic Agents] Hives and Rash    ALL OVER THE BODY    Medication list has been reviewed and updated.  Current Outpatient Prescriptions on File Prior to Visit  Medication  Sig Dispense Refill  . celecoxib (CELEBREX) 200 MG capsule Take 1 capsule (200 mg total) by mouth 2 (two) times daily. PATIENT NEEDS OFFICE VISIT FOR ADDITIONAL REFILLS  180 capsule  3  . cetirizine (ZYRTEC) 10 MG tablet Take 20 mg by mouth daily.      . cyanocobalamin (,VITAMIN B-12,) 1000 MCG/ML injection Inject 1 mL (1,000 mcg total) into the muscle once. monthly  10 mL  3  . fish oil-omega-3 fatty acids 1000 MG capsule Take 2 g by mouth daily.      . Ginkgo Biloba (GINKOBA) 40 MG TABS Take by mouth.      Marland Kitchen omeprazole (PRILOSEC) 20 MG capsule Take 1 capsule (20 mg total) by mouth daily.  30 capsule  3  . traMADol (ULTRAM) 50 MG tablet Take 1 tablet (50 mg total) by mouth every 8 (eight) hours as needed.  90 tablet  1   No current facility-administered medications on file prior to visit.    Review of Systems:  As per HPI- otherwise negative.   Physical Examination: Filed Vitals:   02/22/14 1606  BP: 138/76  Pulse: 63  Temp: 99.2 F (37.3 C)  Resp: 16   Filed Vitals:   02/22/14 1606  Height: 5' 1.5" (1.562 m)  Weight: 180 lb (81.647 kg)   Body mass index is 33.46 kg/(m^2). Ideal Body Weight: Weight in (lb) to have BMI = 25: 134.2  GEN: WDWN, NAD, Non-toxic, A & O x 3, overweight, looks well HEENT: Atraumatic, Normocephalic. Neck supple. No masses, No LAD. Ears and Nose: No external deformity. CV: RRR, No M/G/R. No JVD. No thrill. No extra heart sounds. PULM: CTA B, no wheezes, crackles, rhonchi. No retractions. No resp. distress. No accessory muscle use. ABD: S, NT, ND EXTR: No c/c/e NEURO Normal gait.  PSYCH: Normally interactive. Conversant. Not depressed or anxious appearing.  Calm demeanor.   EKG: NSR, no ST elevation or depression Assessment and Plan: Pre-operative cardiovascular examination - Plan: CBC, Comprehensive metabolic panel, Hemoglobin A1c, EKG 12-Lead  Assuming labs normal anticipate clearance for OR   Signed Lamar Blinks, MD  Called to go  over labs 8/15: overall her labs look ok but she does have diabetes.  This is a new dx.  For the time being encouraged her to adjust her diet, and as soon as her knee operation is complete she will be able to exercise more.  She will come and see me for follow-up of diabetes after her knee surgery.  Letter to pt also.   Results for orders placed in visit on 02/22/14  CBC      Result Value Ref Range   WBC 6.6  4.0 - 10.5 K/uL   RBC 4.19  3.87 - 5.11 MIL/uL   Hemoglobin 12.3  12.0 - 15.0 g/dL   HCT 36.7  36.0 - 46.0 %   MCV 87.6  78.0 - 100.0 fL   MCH 29.4  26.0 - 34.0 pg   MCHC 33.5  30.0 - 36.0 g/dL   RDW 13.2  11.5 - 15.5 %   Platelets 251  150 - 400 K/uL  COMPREHENSIVE METABOLIC PANEL      Result Value Ref Range   Sodium 141  135 - 145 mEq/L   Potassium 4.3  3.5 - 5.3 mEq/L   Chloride 106  96 - 112 mEq/L   CO2 25  19 - 32 mEq/L   Glucose, Bld 90  70 - 99 mg/dL   BUN 16  6 - 23 mg/dL   Creat 1.05  0.50 - 1.10 mg/dL   Total Bilirubin 0.4  0.2 - 1.2 mg/dL   Alkaline Phosphatase 52  39 - 117 U/L   AST 15  0 - 37 U/L   ALT 17  0 - 35 U/L   Total Protein 6.8  6.0 - 8.3 g/dL   Albumin 4.2  3.5 - 5.2 g/dL   Calcium 9.6  8.4 - 10.5 mg/dL  HEMOGLOBIN A1C      Result Value Ref Range   Hemoglobin A1C 6.5 (*) <5.7 %   Mean Plasma Glucose 140 (*) <117 mg/dL   She does have mild DM.

## 2014-02-22 NOTE — Patient Instructions (Signed)
I will be in touch with your labs and will fax your report to Dr. Ronnie Derby.  Take care!

## 2014-02-23 ENCOUNTER — Encounter: Payer: Self-pay | Admitting: Family Medicine

## 2014-02-23 DIAGNOSIS — E119 Type 2 diabetes mellitus without complications: Secondary | ICD-10-CM | POA: Insufficient documentation

## 2014-02-23 DIAGNOSIS — R7303 Prediabetes: Secondary | ICD-10-CM | POA: Insufficient documentation

## 2014-02-23 DIAGNOSIS — E118 Type 2 diabetes mellitus with unspecified complications: Secondary | ICD-10-CM | POA: Insufficient documentation

## 2014-02-23 LAB — COMPREHENSIVE METABOLIC PANEL
ALK PHOS: 52 U/L (ref 39–117)
ALT: 17 U/L (ref 0–35)
AST: 15 U/L (ref 0–37)
Albumin: 4.2 g/dL (ref 3.5–5.2)
BILIRUBIN TOTAL: 0.4 mg/dL (ref 0.2–1.2)
BUN: 16 mg/dL (ref 6–23)
CO2: 25 mEq/L (ref 19–32)
Calcium: 9.6 mg/dL (ref 8.4–10.5)
Chloride: 106 mEq/L (ref 96–112)
Creat: 1.05 mg/dL (ref 0.50–1.10)
Glucose, Bld: 90 mg/dL (ref 70–99)
Potassium: 4.3 mEq/L (ref 3.5–5.3)
Sodium: 141 mEq/L (ref 135–145)
Total Protein: 6.8 g/dL (ref 6.0–8.3)

## 2014-02-23 LAB — HEMOGLOBIN A1C
Hgb A1c MFr Bld: 6.5 % — ABNORMAL HIGH (ref <5.7)
Mean Plasma Glucose: 140 mg/dL — ABNORMAL HIGH (ref <117)

## 2014-02-25 ENCOUNTER — Telehealth: Payer: Self-pay | Admitting: Family Medicine

## 2014-02-25 NOTE — Telephone Encounter (Signed)
Received her labs and will send her a copy. She does have DM, but her A1c is just 6.5.  This gives her an intermediate clinical predictor.  She has >4 mets of activity (except as more recently limited by her knee pain).  Her planned orthopedic procedure is intermediate risk so she may proceed to the OR.  Will fax her form to Dr. Ronnie Derby

## 2014-02-26 DIAGNOSIS — M179 Osteoarthritis of knee, unspecified: Secondary | ICD-10-CM | POA: Insufficient documentation

## 2014-02-26 DIAGNOSIS — M1710 Unilateral primary osteoarthritis, unspecified knee: Secondary | ICD-10-CM | POA: Insufficient documentation

## 2014-02-27 ENCOUNTER — Other Ambulatory Visit: Payer: Self-pay | Admitting: Orthopedic Surgery

## 2014-03-04 ENCOUNTER — Ambulatory Visit: Payer: 59 | Admitting: Family Medicine

## 2014-03-15 ENCOUNTER — Encounter (HOSPITAL_COMMUNITY): Payer: Self-pay | Admitting: Pharmacy Technician

## 2014-03-15 ENCOUNTER — Ambulatory Visit (HOSPITAL_COMMUNITY)
Admission: RE | Admit: 2014-03-15 | Discharge: 2014-03-15 | Disposition: A | Discharge status: Home / Self Care | Payer: 59 | Source: Ambulatory Visit | Attending: Orthopedic Surgery | Admitting: Orthopedic Surgery

## 2014-03-15 ENCOUNTER — Encounter (HOSPITAL_COMMUNITY): Payer: Self-pay

## 2014-03-15 ENCOUNTER — Encounter (HOSPITAL_COMMUNITY)
Admission: RE | Admit: 2014-03-15 | Discharge: 2014-03-15 | Disposition: A | Discharge status: Home / Self Care | Payer: 59 | Source: Ambulatory Visit | Attending: Orthopedic Surgery | Admitting: Orthopedic Surgery

## 2014-03-15 DIAGNOSIS — I517 Cardiomegaly: Secondary | ICD-10-CM | POA: Diagnosis not present

## 2014-03-15 DIAGNOSIS — Z01818 Encounter for other preprocedural examination: Secondary | ICD-10-CM | POA: Diagnosis present

## 2014-03-15 HISTORY — DX: Other specified postprocedural states: Z98.890

## 2014-03-15 HISTORY — DX: Other specified postprocedural states: R11.2

## 2014-03-15 LAB — COMPREHENSIVE METABOLIC PANEL
ALBUMIN: 4 g/dL (ref 3.5–5.2)
ALT: 16 U/L (ref 0–35)
AST: 16 U/L (ref 0–37)
Alkaline Phosphatase: 54 U/L (ref 39–117)
Anion gap: 13 (ref 5–15)
BUN: 19 mg/dL (ref 6–23)
CO2: 24 meq/L (ref 19–32)
Calcium: 9.2 mg/dL (ref 8.4–10.5)
Chloride: 104 mEq/L (ref 96–112)
Creatinine, Ser: 0.81 mg/dL (ref 0.50–1.10)
GFR calc Af Amer: 89 mL/min — ABNORMAL LOW (ref >90)
GFR calc non Af Amer: 77 mL/min — ABNORMAL LOW (ref >90)
Glucose, Bld: 107 mg/dL — ABNORMAL HIGH (ref 70–99)
POTASSIUM: 4.1 meq/L (ref 3.7–5.3)
Sodium: 141 mEq/L (ref 137–147)
Total Bilirubin: 0.5 mg/dL (ref 0.3–1.2)
Total Protein: 7.3 g/dL (ref 6.0–8.3)

## 2014-03-15 LAB — CBC WITH DIFFERENTIAL/PLATELET
BASOS ABS: 0 10*3/uL (ref 0.0–0.1)
BASOS PCT: 0 % (ref 0–1)
Eosinophils Absolute: 0.1 10*3/uL (ref 0.0–0.7)
Eosinophils Relative: 2 % (ref 0–5)
HEMATOCRIT: 38.1 % (ref 36.0–46.0)
Hemoglobin: 12.9 g/dL (ref 12.0–15.0)
Lymphocytes Relative: 56 % — ABNORMAL HIGH (ref 12–46)
Lymphs Abs: 2.5 10*3/uL (ref 0.7–4.0)
MCH: 29.7 pg (ref 26.0–34.0)
MCHC: 33.9 g/dL (ref 30.0–36.0)
MCV: 87.6 fL (ref 78.0–100.0)
Monocytes Absolute: 0.3 10*3/uL (ref 0.1–1.0)
Monocytes Relative: 6 % (ref 3–12)
NEUTROS ABS: 1.6 10*3/uL — AB (ref 1.7–7.7)
Neutrophils Relative %: 36 % — ABNORMAL LOW (ref 43–77)
PLATELETS: 239 10*3/uL (ref 150–400)
RBC: 4.35 MIL/uL (ref 3.87–5.11)
RDW: 12.8 % (ref 11.5–15.5)
WBC: 4.4 10*3/uL (ref 4.0–10.5)

## 2014-03-15 LAB — TYPE AND SCREEN
ABO/RH(D): A POS
Antibody Screen: NEGATIVE

## 2014-03-15 LAB — URINALYSIS, ROUTINE W REFLEX MICROSCOPIC
BILIRUBIN URINE: NEGATIVE
Glucose, UA: NEGATIVE mg/dL
HGB URINE DIPSTICK: NEGATIVE
Ketones, ur: NEGATIVE mg/dL
Leukocytes, UA: NEGATIVE
NITRITE: NEGATIVE
PH: 6.5 (ref 5.0–8.0)
Protein, ur: NEGATIVE mg/dL
SPECIFIC GRAVITY, URINE: 1.019 (ref 1.005–1.030)
UROBILINOGEN UA: 0.2 mg/dL (ref 0.0–1.0)

## 2014-03-15 LAB — SURGICAL PCR SCREEN
MRSA, PCR: NEGATIVE
STAPHYLOCOCCUS AUREUS: NEGATIVE

## 2014-03-15 LAB — ABO/RH: ABO/RH(D): A POS

## 2014-03-15 LAB — APTT: APTT: 26 s (ref 24–37)

## 2014-03-15 LAB — PROTIME-INR
INR: 0.95 (ref 0.00–1.49)
PROTHROMBIN TIME: 12.7 s (ref 11.6–15.2)

## 2014-03-15 MED ORDER — SODIUM CHLORIDE 0.9 % IV SOLN: INTRAVENOUS | Status: DC

## 2014-03-15 NOTE — Pre-Procedure Instructions (Signed)
LILLE KARIM  03/15/2014   Your procedure is scheduled on:  03/25/14  Report to [facility] Admitting at 750 AM.  Call this number if you have problems the morning of surgery: [phone]   Remember:   Do not eat food or drink liquids after midnight.   Take these medicines the morning of surgery with A SIP OF WATER: prilosec,zyrtec   Do not wear jewelry, make-up or nail polish.  Do not wear lotions, powders, or perfumes. You may wear deodorant.  Do not shave 48 hours prior to surgery. Men may shave face and neck.  Do not bring valuables to the hospital.  [facility] is not responsible                  for any belongings or valuables.               Contacts, dentures or bridgework may not be worn into surgery.  Leave suitcase in the car. After surgery it may be brought to your room.  For patients admitted to the hospital, discharge time is determined by your                treatment team.               Patients discharged the day of surgery will not be allowed to drive  home.  Name and phone number of your driver:   Special Instructions: Shower using CHG 2 nights before surgery and the night before surgery.  If you shower the day of surgery use CHG.  Use special wash - you have one bottle of CHG for all showers.  You should use approximately 1/3 of the bottle for each shower.   Please read over the following fact sheets that you were given: Pain Booklet, Coughing and Deep Breathing, Blood Transfusion Information, MRSA Information and Surgical Site Infection Prevention

## 2014-03-24 MED ORDER — TRANEXAMIC ACID 100 MG/ML IV SOLN
1000.0000 mg | INTRAVENOUS | Status: AC
  Administered 2014-03-25: 1000 mg via INTRAVENOUS
  Filled 2014-03-24: qty 10

## 2014-03-24 MED ORDER — BUPIVACAINE LIPOSOME 1.3 % IJ SUSP
20.0000 mL | Freq: Once | INTRAMUSCULAR | Status: DC
  Filled 2014-03-24: qty 20

## 2014-03-24 MED ORDER — CHLORHEXIDINE GLUCONATE 4 % EX LIQD
60.0000 mL | Freq: Once | CUTANEOUS | Status: DC
  Filled 2014-03-24: qty 60

## 2014-03-24 MED ORDER — CEFAZOLIN SODIUM-DEXTROSE 2-3 GM-% IV SOLR
2.0000 g | INTRAVENOUS | Status: AC
  Administered 2014-03-25: 2 g via INTRAVENOUS
  Filled 2014-03-24 (×2): qty 50

## 2014-03-25 ENCOUNTER — Encounter (HOSPITAL_COMMUNITY): Payer: Self-pay | Admitting: *Deleted

## 2014-03-25 ENCOUNTER — Encounter (HOSPITAL_COMMUNITY): Payer: 59 | Admitting: Certified Registered"

## 2014-03-25 ENCOUNTER — Encounter (HOSPITAL_COMMUNITY)
Admission: RE | Disposition: A | Discharge status: Home Health | Payer: Self-pay | Source: Ambulatory Visit | Attending: Orthopedic Surgery

## 2014-03-25 ENCOUNTER — Inpatient Hospital Stay (HOSPITAL_COMMUNITY): Payer: 59 | Admitting: Certified Registered"

## 2014-03-25 ENCOUNTER — Inpatient Hospital Stay (HOSPITAL_COMMUNITY)
Admission: RE | Admit: 2014-03-25 | Discharge: 2014-03-26 | DRG: 470 | Disposition: A | Discharge status: Home Health | Payer: 59 | Source: Ambulatory Visit | Attending: Orthopedic Surgery | Admitting: Orthopedic Surgery

## 2014-03-25 DIAGNOSIS — D62 Acute posthemorrhagic anemia: Secondary | ICD-10-CM | POA: Diagnosis not present

## 2014-03-25 DIAGNOSIS — N189 Chronic kidney disease, unspecified: Secondary | ICD-10-CM | POA: Diagnosis present

## 2014-03-25 DIAGNOSIS — E119 Type 2 diabetes mellitus without complications: Secondary | ICD-10-CM | POA: Diagnosis present

## 2014-03-25 DIAGNOSIS — E7800 Pure hypercholesterolemia, unspecified: Secondary | ICD-10-CM | POA: Diagnosis present

## 2014-03-25 DIAGNOSIS — Z96659 Presence of unspecified artificial knee joint: Secondary | ICD-10-CM

## 2014-03-25 DIAGNOSIS — M1710 Unilateral primary osteoarthritis, unspecified knee: Principal | ICD-10-CM | POA: Diagnosis present

## 2014-03-25 DIAGNOSIS — Z96651 Presence of right artificial knee joint: Secondary | ICD-10-CM

## 2014-03-25 HISTORY — PX: TOTAL KNEE ARTHROPLASTY: SHX125

## 2014-03-25 HISTORY — DX: Type 2 diabetes mellitus without complications: E11.9

## 2014-03-25 LAB — CBC
HEMATOCRIT: 34.8 % — AB (ref 36.0–46.0)
Hemoglobin: 11.9 g/dL — ABNORMAL LOW (ref 12.0–15.0)
MCH: 29.8 pg (ref 26.0–34.0)
MCHC: 34.2 g/dL (ref 30.0–36.0)
MCV: 87.2 fL (ref 78.0–100.0)
Platelets: 237 10*3/uL (ref 150–400)
RBC: 3.99 MIL/uL (ref 3.87–5.11)
RDW: 13 % (ref 11.5–15.5)
WBC: 9.2 10*3/uL (ref 4.0–10.5)

## 2014-03-25 LAB — GLUCOSE, CAPILLARY
Glucose-Capillary: 114 mg/dL — ABNORMAL HIGH (ref 70–99)
Glucose-Capillary: 128 mg/dL — ABNORMAL HIGH (ref 70–99)

## 2014-03-25 LAB — CREATININE, SERUM
Creatinine, Ser: 0.74 mg/dL (ref 0.50–1.10)
GFR calc Af Amer: >90 mL/min (ref >90)
GFR calc non Af Amer: 90 mL/min — ABNORMAL LOW (ref >90)

## 2014-03-25 SURGERY — ARTHROPLASTY, KNEE, TOTAL
Anesthesia: General | Laterality: Right

## 2014-03-25 MED ORDER — CEFAZOLIN SODIUM-DEXTROSE 2-3 GM-% IV SOLR
2.0000 g | Freq: Four times a day (QID) | INTRAVENOUS | Status: AC
  Administered 2014-03-25 – 2014-03-26 (×2): 2 g via INTRAVENOUS
  Filled 2014-03-25 (×3): qty 50

## 2014-03-25 MED ORDER — HYDROMORPHONE HCL PF 1 MG/ML IJ SOLN
0.2500 mg | INTRAMUSCULAR | Status: DC | PRN
  Administered 2014-03-25 (×4): 0.5 mg via INTRAVENOUS

## 2014-03-25 MED ORDER — PHENOL 1.4 % MT LIQD: 1.0000 | OROMUCOSAL | Status: DC | PRN

## 2014-03-25 MED ORDER — METOCLOPRAMIDE HCL 10 MG PO TABS
5.0000 mg | ORAL_TABLET | Freq: Three times a day (TID) | ORAL | Status: DC | PRN
Start: 2014-03-25 — End: 2014-03-26

## 2014-03-25 MED ORDER — OXYCODONE HCL 5 MG PO TABS
5.0000 mg | ORAL_TABLET | ORAL | Status: DC | PRN
  Administered 2014-03-25: 5 mg via ORAL
  Administered 2014-03-25 – 2014-03-26 (×2): 10 mg via ORAL
  Administered 2014-03-26: 5 mg via ORAL
  Administered 2014-03-26 (×2): 10 mg via ORAL
  Filled 2014-03-25: qty 2
  Filled 2014-03-25: qty 1
  Filled 2014-03-25 (×4): qty 2

## 2014-03-25 MED ORDER — METOCLOPRAMIDE HCL 5 MG/ML IJ SOLN: 5.0000 mg | Freq: Three times a day (TID) | INTRAMUSCULAR | Status: DC | PRN

## 2014-03-25 MED ORDER — ACETAMINOPHEN 325 MG PO TABS: 650.0000 mg | ORAL_TABLET | Freq: Four times a day (QID) | ORAL | Status: DC | PRN

## 2014-03-25 MED ORDER — EPHEDRINE SULFATE 50 MG/ML IJ SOLN
INTRAMUSCULAR | Status: DC | PRN
  Administered 2014-03-25 (×2): 5 mg via INTRAVENOUS

## 2014-03-25 MED ORDER — OXYCODONE HCL ER 10 MG PO T12A
10.0000 mg | EXTENDED_RELEASE_TABLET | Freq: Two times a day (BID) | ORAL | Status: DC
  Administered 2014-03-25 – 2014-03-26 (×2): 10 mg via ORAL
  Filled 2014-03-25 (×2): qty 1

## 2014-03-25 MED ORDER — MENTHOL 3 MG MT LOZG: 1.0000 | LOZENGE | OROMUCOSAL | Status: DC | PRN

## 2014-03-25 MED ORDER — BUPIVACAINE-EPINEPHRINE 0.5% -1:200000 IJ SOLN
INTRAMUSCULAR | Status: DC | PRN
  Administered 2014-03-25: 20 mL

## 2014-03-25 MED ORDER — OXYCODONE HCL 5 MG PO TABS
ORAL_TABLET | ORAL | Status: AC
  Filled 2014-03-25: qty 2

## 2014-03-25 MED ORDER — BUPIVACAINE LIPOSOME 1.3 % IJ SUSP
INTRAMUSCULAR | Status: DC | PRN
  Administered 2014-03-25: 20 mL

## 2014-03-25 MED ORDER — HYDROMORPHONE HCL PF 1 MG/ML IJ SOLN
INTRAMUSCULAR | Status: AC
  Filled 2014-03-25: qty 1

## 2014-03-25 MED ORDER — BISACODYL 5 MG PO TBEC: 5.0000 mg | DELAYED_RELEASE_TABLET | Freq: Every day | ORAL | Status: DC | PRN

## 2014-03-25 MED ORDER — FLEET ENEMA 7-19 GM/118ML RE ENEM: 1.0000 | ENEMA | Freq: Once | RECTAL | Status: AC | PRN

## 2014-03-25 MED ORDER — LIDOCAINE HCL (CARDIAC) 10 MG/ML IV SOLN
INTRAVENOUS | Status: DC | PRN
  Administered 2014-03-25: 75 mg via INTRAVENOUS

## 2014-03-25 MED ORDER — MIDAZOLAM HCL 2 MG/2ML IJ SOLN
INTRAMUSCULAR | Status: AC
  Administered 2014-03-25: 1 mg
  Filled 2014-03-25: qty 2

## 2014-03-25 MED ORDER — PROPOFOL 10 MG/ML IV BOLUS
INTRAVENOUS | Status: AC
  Filled 2014-03-25: qty 20

## 2014-03-25 MED ORDER — ONDANSETRON HCL 4 MG PO TABS
4.0000 mg | ORAL_TABLET | Freq: Four times a day (QID) | ORAL | Status: DC | PRN
  Administered 2014-03-26: 4 mg via ORAL
  Filled 2014-03-25: qty 1

## 2014-03-25 MED ORDER — MIDAZOLAM HCL 5 MG/5ML IJ SOLN
INTRAMUSCULAR | Status: DC | PRN
  Administered 2014-03-25 (×2): 1 mg via INTRAVENOUS

## 2014-03-25 MED ORDER — ONDANSETRON HCL 4 MG/2ML IJ SOLN
4.0000 mg | Freq: Four times a day (QID) | INTRAMUSCULAR | Status: DC | PRN
  Administered 2014-03-25 – 2014-03-26 (×2): 4 mg via INTRAVENOUS
  Filled 2014-03-25 (×2): qty 2

## 2014-03-25 MED ORDER — HYDROMORPHONE HCL PF 1 MG/ML IJ SOLN
1.0000 mg | INTRAMUSCULAR | Status: DC | PRN
  Administered 2014-03-25 – 2014-03-26 (×2): 1 mg via INTRAVENOUS
  Filled 2014-03-25: qty 1

## 2014-03-25 MED ORDER — LIDOCAINE HCL (CARDIAC) 20 MG/ML IV SOLN
INTRAVENOUS | Status: AC
  Filled 2014-03-25: qty 5

## 2014-03-25 MED ORDER — DOCUSATE SODIUM 100 MG PO CAPS
100.0000 mg | ORAL_CAPSULE | Freq: Two times a day (BID) | ORAL | Status: DC
  Administered 2014-03-25 – 2014-03-26 (×2): 100 mg via ORAL
  Filled 2014-03-25 (×2): qty 1

## 2014-03-25 MED ORDER — DIPHENHYDRAMINE HCL 12.5 MG/5ML PO ELIX
12.5000 mg | ORAL_SOLUTION | ORAL | Status: DC | PRN
Start: 2014-03-25 — End: 2014-03-26

## 2014-03-25 MED ORDER — OXYCODONE HCL 5 MG PO TABS
5.0000 mg | ORAL_TABLET | Freq: Once | ORAL | Status: AC | PRN
  Administered 2014-03-25: 5 mg via ORAL

## 2014-03-25 MED ORDER — ONDANSETRON HCL 4 MG/2ML IJ SOLN
INTRAMUSCULAR | Status: DC | PRN
  Administered 2014-03-25: 4 mg via INTRAVENOUS

## 2014-03-25 MED ORDER — BUPIVACAINE-EPINEPHRINE (PF) 0.5% -1:200000 IJ SOLN
INTRAMUSCULAR | Status: AC
  Filled 2014-03-25: qty 30

## 2014-03-25 MED ORDER — FENTANYL CITRATE 0.05 MG/ML IJ SOLN
INTRAMUSCULAR | Status: AC
Start: 2014-03-25 — End: 2014-03-25
  Administered 2014-03-25: 50 ug
  Filled 2014-03-25: qty 2

## 2014-03-25 MED ORDER — 0.9 % SODIUM CHLORIDE (POUR BTL) OPTIME
TOPICAL | Status: DC | PRN
  Administered 2014-03-25: 1000 mL

## 2014-03-25 MED ORDER — SENNOSIDES-DOCUSATE SODIUM 8.6-50 MG PO TABS: 1.0000 | ORAL_TABLET | Freq: Every evening | ORAL | Status: DC | PRN

## 2014-03-25 MED ORDER — LACTATED RINGERS IV SOLN
INTRAVENOUS | Status: DC | PRN
  Administered 2014-03-25 (×2): via INTRAVENOUS

## 2014-03-25 MED ORDER — ALUM & MAG HYDROXIDE-SIMETH 200-200-20 MG/5ML PO SUSP: 30.0000 mL | ORAL | Status: DC | PRN

## 2014-03-25 MED ORDER — ACETAMINOPHEN 650 MG RE SUPP: 650.0000 mg | Freq: Four times a day (QID) | RECTAL | Status: DC | PRN

## 2014-03-25 MED ORDER — ENOXAPARIN SODIUM 30 MG/0.3ML ~~LOC~~ SOLN
30.0000 mg | Freq: Two times a day (BID) | SUBCUTANEOUS | Status: DC
  Administered 2014-03-26: 30 mg via SUBCUTANEOUS
  Filled 2014-03-25 (×3): qty 0.3

## 2014-03-25 MED ORDER — SODIUM CHLORIDE 0.9 % IV SOLN: INTRAVENOUS | Status: DC

## 2014-03-25 MED ORDER — ARTIFICIAL TEARS OP OINT
TOPICAL_OINTMENT | OPHTHALMIC | Status: AC
  Filled 2014-03-25: qty 3.5

## 2014-03-25 MED ORDER — ONDANSETRON HCL 4 MG/2ML IJ SOLN
INTRAMUSCULAR | Status: AC
  Filled 2014-03-25: qty 2

## 2014-03-25 MED ORDER — ZOLPIDEM TARTRATE 5 MG PO TABS: 5.0000 mg | ORAL_TABLET | Freq: Every evening | ORAL | Status: DC | PRN

## 2014-03-25 MED ORDER — MIDAZOLAM HCL 2 MG/2ML IJ SOLN
INTRAMUSCULAR | Status: AC
  Filled 2014-03-25: qty 2

## 2014-03-25 MED ORDER — PANTOPRAZOLE SODIUM 40 MG PO TBEC
40.0000 mg | DELAYED_RELEASE_TABLET | Freq: Every day | ORAL | Status: DC
  Administered 2014-03-25 – 2014-03-26 (×2): 40 mg via ORAL
  Filled 2014-03-25 (×2): qty 1

## 2014-03-25 MED ORDER — FENTANYL CITRATE 0.05 MG/ML IJ SOLN
INTRAMUSCULAR | Status: DC | PRN
  Administered 2014-03-25 (×2): 50 ug via INTRAVENOUS
  Administered 2014-03-25 (×2): 25 ug via INTRAVENOUS
  Administered 2014-03-25: 50 ug via INTRAVENOUS

## 2014-03-25 MED ORDER — DEXAMETHASONE SODIUM PHOSPHATE 4 MG/ML IJ SOLN
INTRAMUSCULAR | Status: DC | PRN
  Administered 2014-03-25: 4 mg via INTRAVENOUS

## 2014-03-25 MED ORDER — ONDANSETRON HCL 4 MG/2ML IJ SOLN: 4.0000 mg | Freq: Once | INTRAMUSCULAR | Status: DC | PRN

## 2014-03-25 MED ORDER — CELECOXIB 200 MG PO CAPS
200.0000 mg | ORAL_CAPSULE | Freq: Every day | ORAL | Status: DC
  Administered 2014-03-26: 200 mg via ORAL
  Filled 2014-03-25 (×2): qty 1

## 2014-03-25 MED ORDER — PHENYLEPHRINE 40 MCG/ML (10ML) SYRINGE FOR IV PUSH (FOR BLOOD PRESSURE SUPPORT)
PREFILLED_SYRINGE | INTRAVENOUS | Status: AC
  Filled 2014-03-25: qty 20

## 2014-03-25 MED ORDER — METHOCARBAMOL 1000 MG/10ML IJ SOLN
500.0000 mg | Freq: Four times a day (QID) | INTRAVENOUS | Status: DC | PRN
  Filled 2014-03-25: qty 5

## 2014-03-25 MED ORDER — FENTANYL CITRATE 0.05 MG/ML IJ SOLN
INTRAMUSCULAR | Status: AC
Start: 2014-03-25 — End: 2014-03-25
  Filled 2014-03-25: qty 5

## 2014-03-25 MED ORDER — OXYCODONE HCL 5 MG PO TABS
ORAL_TABLET | ORAL | Status: AC
Start: 2014-03-25 — End: 2014-03-26
  Filled 2014-03-25: qty 1

## 2014-03-25 MED ORDER — OXYCODONE HCL 5 MG/5ML PO SOLN: 5.0000 mg | Freq: Once | ORAL | Status: AC | PRN

## 2014-03-25 MED ORDER — LORATADINE 10 MG PO TABS
10.0000 mg | ORAL_TABLET | Freq: Every day | ORAL | Status: DC
  Administered 2014-03-26: 10 mg via ORAL
  Filled 2014-03-25 (×2): qty 1

## 2014-03-25 MED ORDER — SODIUM CHLORIDE 0.9 % IR SOLN
Status: DC | PRN
  Administered 2014-03-25: 1000 mL

## 2014-03-25 MED ORDER — METHOCARBAMOL 500 MG PO TABS
500.0000 mg | ORAL_TABLET | Freq: Four times a day (QID) | ORAL | Status: DC | PRN
  Administered 2014-03-25 – 2014-03-26 (×3): 500 mg via ORAL
  Filled 2014-03-25 (×3): qty 1

## 2014-03-25 MED ORDER — PROPOFOL 10 MG/ML IV BOLUS
INTRAVENOUS | Status: DC | PRN
  Administered 2014-03-25: 150 mg via INTRAVENOUS
  Administered 2014-03-25: 50 mg via INTRAVENOUS

## 2014-03-25 MED ORDER — METHOCARBAMOL 500 MG PO TABS
ORAL_TABLET | ORAL | Status: AC
Start: 2014-03-25 — End: 2014-03-26
  Filled 2014-03-25: qty 1

## 2014-03-25 MED ORDER — PHENYLEPHRINE HCL 10 MG/ML IJ SOLN
INTRAMUSCULAR | Status: DC | PRN
  Administered 2014-03-25 (×3): 80 ug via INTRAVENOUS
  Administered 2014-03-25: 40 ug via INTRAVENOUS
  Administered 2014-03-25: 80 ug via INTRAVENOUS
  Administered 2014-03-25: 40 ug via INTRAVENOUS
  Administered 2014-03-25 (×2): 80 ug via INTRAVENOUS
  Administered 2014-03-25 (×2): 40 ug via INTRAVENOUS
  Administered 2014-03-25: 80 ug via INTRAVENOUS

## 2014-03-25 SURGICAL SUPPLY — 57 items
BANDAGE ESMARK 6X9 LF (GAUZE/BANDAGES/DRESSINGS) ×1 IMPLANT
BLADE SAGITTAL 13X1.27X60 (BLADE) ×2 IMPLANT
BLADE SAGITTAL 13X1.27X60MM (BLADE) ×1
BLADE SAW SGTL 83.5X18.5 (BLADE) ×3 IMPLANT
BNDG ESMARK 6X9 LF (GAUZE/BANDAGES/DRESSINGS) ×3
BOWL SMART MIX CTS (DISPOSABLE) ×3 IMPLANT
CAP POR TM CP VIT E LN CER HD ×3 IMPLANT
CEMENT BONE SIMPLEX SPEEDSET (Cement) ×6 IMPLANT
COVER SURGICAL LIGHT HANDLE (MISCELLANEOUS) ×3 IMPLANT
CUFF TOURNIQUET SINGLE 34IN LL (TOURNIQUET CUFF) ×3 IMPLANT
DRAPE EXTREMITY T 121X128X90 (DRAPE) ×3 IMPLANT
DRAPE INCISE IOBAN 66X45 STRL (DRAPES) ×6 IMPLANT
DRAPE PROXIMA HALF (DRAPES) ×3 IMPLANT
DRAPE U-SHAPE 47X51 STRL (DRAPES) ×3 IMPLANT
DRSG ADAPTIC 3X8 NADH LF (GAUZE/BANDAGES/DRESSINGS) ×3 IMPLANT
DRSG PAD ABDOMINAL 8X10 ST (GAUZE/BANDAGES/DRESSINGS) ×3 IMPLANT
DURAPREP 26ML APPLICATOR (WOUND CARE) ×6 IMPLANT
ELECT REM PT RETURN 9FT ADLT (ELECTROSURGICAL) ×3
ELECTRODE REM PT RTRN 9FT ADLT (ELECTROSURGICAL) ×1 IMPLANT
EVACUATOR 1/8 PVC DRAIN (DRAIN) ×3 IMPLANT
GAUZE SPONGE 4X4 12PLY STRL (GAUZE/BANDAGES/DRESSINGS) ×3 IMPLANT
GLOVE BIOGEL M 7.0 STRL (GLOVE) IMPLANT
GLOVE BIOGEL PI IND STRL 7.5 (GLOVE) IMPLANT
GLOVE BIOGEL PI IND STRL 8.5 (GLOVE) ×2 IMPLANT
GLOVE BIOGEL PI INDICATOR 7.5 (GLOVE)
GLOVE BIOGEL PI INDICATOR 8.5 (GLOVE) ×4
GLOVE SURG ORTHO 8.0 STRL STRW (GLOVE) ×6 IMPLANT
GOWN STRL REUS W/ TWL LRG LVL3 (GOWN DISPOSABLE) ×2 IMPLANT
GOWN STRL REUS W/ TWL XL LVL3 (GOWN DISPOSABLE) ×2 IMPLANT
GOWN STRL REUS W/TWL LRG LVL3 (GOWN DISPOSABLE) ×4
GOWN STRL REUS W/TWL XL LVL3 (GOWN DISPOSABLE) ×4
HANDPIECE INTERPULSE COAX TIP (DISPOSABLE) ×2
HOOD PEEL AWAY FACE SHEILD DIS (HOOD) ×12 IMPLANT
KIT BASIN OR (CUSTOM PROCEDURE TRAY) ×3 IMPLANT
KIT ROOM TURNOVER OR (KITS) ×3 IMPLANT
MANIFOLD NEPTUNE II (INSTRUMENTS) ×3 IMPLANT
NEEDLE 22X1 1/2 (OR ONLY) (NEEDLE) ×6 IMPLANT
NS IRRIG 1000ML POUR BTL (IV SOLUTION) ×3 IMPLANT
PACK TOTAL JOINT (CUSTOM PROCEDURE TRAY) ×3 IMPLANT
PAD ABD 8X10 STRL (GAUZE/BANDAGES/DRESSINGS) ×3 IMPLANT
PAD ARMBOARD 7.5X6 YLW CONV (MISCELLANEOUS) ×6 IMPLANT
PADDING CAST COTTON 6X4 STRL (CAST SUPPLIES) ×3 IMPLANT
SET HNDPC FAN SPRY TIP SCT (DISPOSABLE) ×1 IMPLANT
SPONGE GAUZE 4X4 12PLY STER LF (GAUZE/BANDAGES/DRESSINGS) ×3 IMPLANT
STAPLER VISISTAT 35W (STAPLE) ×3 IMPLANT
SUCTION FRAZIER TIP 10 FR DISP (SUCTIONS) ×3 IMPLANT
SUT BONE WAX W31G (SUTURE) ×3 IMPLANT
SUT VIC AB 0 CTB1 27 (SUTURE) ×6 IMPLANT
SUT VIC AB 1 CT1 27 (SUTURE) ×4
SUT VIC AB 1 CT1 27XBRD ANBCTR (SUTURE) ×2 IMPLANT
SUT VIC AB 2-0 CT1 27 (SUTURE) ×4
SUT VIC AB 2-0 CT1 TAPERPNT 27 (SUTURE) ×2 IMPLANT
SYR 20CC LL (SYRINGE) ×6 IMPLANT
TOWEL OR 17X24 6PK STRL BLUE (TOWEL DISPOSABLE) ×3 IMPLANT
TOWEL OR 17X26 10 PK STRL BLUE (TOWEL DISPOSABLE) ×3 IMPLANT
TRAY FOLEY CATH 14FR (SET/KITS/TRAYS/PACK) ×3 IMPLANT
WATER STERILE IRR 1000ML POUR (IV SOLUTION) ×6 IMPLANT

## 2014-03-25 NOTE — Anesthesia Preprocedure Evaluation (Addendum)
Anesthesia Evaluation  Patient identified by MRN, date of birth, ID band Patient awake    Reviewed: Allergy & Precautions, H&P , NPO status , Patient's Chart, lab work & pertinent test results  History of Anesthesia Complications (+) PONV and history of anesthetic complications  Airway Mallampati: II TM Distance: >3 FB Neck ROM: Full    Dental  (+) Teeth Intact, Dental Advisory Given   Pulmonary  breath sounds clear to auscultation        Cardiovascular Rhythm:Regular Rate:Normal     Neuro/Psych    GI/Hepatic   Endo/Other  diabetes, Type 2  Renal/GU      Musculoskeletal  (+) Arthritis -,   Abdominal   Peds  Hematology   Anesthesia Other Findings   Reproductive/Obstetrics                         Anesthesia Physical Anesthesia Plan  ASA: II  Anesthesia Plan: General   Post-op Pain Management:    Induction: Intravenous  Airway Management Planned: LMA  Additional Equipment:   Intra-op Plan:   Post-operative Plan: Extubation in OR  Informed Consent: I have reviewed the patients History and Physical, chart, labs and discussed the procedure including the risks, benefits and alternatives for the proposed anesthesia with the patient or authorized representative who has indicated his/her understanding and acceptance.   Dental advisory given  Plan Discussed with: CRNA, Anesthesiologist and Surgeon  Anesthesia Plan Comments: (DJD R. Knee Type 2 DM glucose 114  Plan GA with LMA and adductor canal block  Roberts Gaudy, MD )       Anesthesia Quick Evaluation

## 2014-03-25 NOTE — Anesthesia Postprocedure Evaluation (Signed)
  Anesthesia Post-op Note  Patient: Peggy Santiago  Procedure(s) Performed: Procedure(s): TOTAL KNEE ARTHROPLASTY (Right)  Patient Location: PACU  Anesthesia Type:General and GA combined with regional for post-op pain  Level of Consciousness: awake, alert  and oriented  Airway and Oxygen Therapy: Patient Spontanous Breathing and Patient connected to nasal cannula oxygen  Post-op Pain: mild  Post-op Assessment: Post-op Vital signs reviewed, Patient's Cardiovascular Status Stable, Respiratory Function Stable, Patent Airway and Pain level controlled  Post-op Vital Signs: stable  Last Vitals:  Filed Vitals:   03/25/14 1300  BP: 132/79  Pulse: 109  Temp:   Resp: 17    Complications: No apparent anesthesia complications

## 2014-03-25 NOTE — Op Note (Signed)
TOTAL KNEE REPLACEMENT OPERATIVE NOTE:  03/25/2014  1:08 PM  PATIENT:  Peggy Santiago  63 y.o. female  PRE-OPERATIVE DIAGNOSIS:  osteoarthritis right knee  POST-OPERATIVE DIAGNOSIS:  OA right knee  PROCEDURE:  Procedure(s): TOTAL KNEE ARTHROPLASTY  SURGEON:  Surgeon(s): Vickey Huger, MD  PHYSICIAN ASSISTANT: Carlynn Spry, [facility]  ANESTHESIA:   general  DRAINS: Hemovac  SPECIMEN: None  COUNTS:  Correct  TOURNIQUET:   Total Tourniquet Time Documented: Thigh (Right) - 46 minutes Total: Thigh (Right) - 46 minutes   DICTATION:  Indication for procedure:    The patient is a 63 y.o. female who has failed conservative treatment for osteoarthritis right knee.  Informed consent was obtained prior to anesthesia. The risks versus benefits of the operation were explain and in a way the patient can, and did, understand.   On the implant demand matching protocol, this patient scored 9.  Therefore, this patient was not receive a polyethylene insert with vitamin E which is a high demand implant.  Description of procedure:     The patient was taken to the operating room and placed under anesthesia.  The patient was positioned in the usual fashion taking care that all body parts were adequately padded and/or protected.  I foley catheter was not placed.  A tourniquet was applied and the leg prepped and draped in the usual sterile fashion.  The extremity was exsanguinated with the esmarch and tourniquet inflated to 350 mmHg.  Pre-operative range of motion was normal.  The knee was in 5 degree of mild varus.  A midline incision approximately 6-7 inches long was made with a #10 blade.  A new blade was used to make a parapatellar arthrotomy going 2-3 cm into the quadriceps tendon, over the patella, and alongside the medial aspect of the patellar tendon.  A synovectomy was then performed with the #10 blade and forceps. I then elevated the deep MCL off the medial tibial metaphysis subperiosteally  around to the semimembranosus attachment.    I everted the patella and used calipers to measure patellar thickness.  I used the reamer to ream down to appropriate thickness to recreate the native thickness.  I then removed excess bone with the rongeur and sagittal saw.  I used the appropriately sized template and drilled the three lug holes.  I then put the trial in place and measured the thickness with the calipers to ensure recreation of the native thickness.  The trial was then removed and the patella subluxed and the knee brought into flexion.  A homan retractor was place to retract and protect the patella and lateral structures.  A Z-retractor was place medially to protect the medial structures.  The extra-medullary alignment system was used to make cut the tibial articular surface perpendicular to the anamotic axis of the tibia and in 3 degrees of posterior slope.  The cut surface and alignment jig was removed.  I then used the intramedullary alignment guide to make a 6 valgus cut on the distal femur.  I then marked out the epicondylar axis on the distal femur.  The posterior condylar axis measured 3 degrees.  I then used the anterior referencing sizer and measured the femur to be a size 7.  The 4-In-1 cutting block was screwed into place in external rotation matching the posterior condylar angle, making our cuts perpendicular to the epicondylar axis.  Anterior, posterior and chamfer cuts were made with the sagittal saw.  The cutting block and cut pieces were removed.  A  lamina spreader was placed in 90 degrees of flexion.  The ACL, PCL, menisci, and posterior condylar osteophytes were removed.  A 10 mm spacer blocked was found to offer good flexion and extension gap balance after mild in degree releasing.   The scoop retractor was then placed and the femoral finishing block was pinned in place.  The small sagittal saw was used as well as the lug drill to finish the femur.  The block and cut surfaces  were removed and the medullary canal hole filled with autograft bone from the cut pieces.  The tibia was delivered forward in deep flexion and external rotation.  A size D tray was selected and pinned into place centered on the medial 1/3 of the tibial tubercle.  The reamer and keel was used to prepare the tibia through the tray.    I then trialed with the size 7 femur, size D tibia, a 10 mm insert and the 32 patella.  I had excellent flexion/extension gap balance, excellent patella tracking.  Flexion was full and beyond 120 degrees; extension was zero.  These components were chosen and the staff opened them to me on the back table while the knee was lavaged copiously and the cement mixed.  The soft tissue was infiltrated with 60cc of exparel 1.3% through a 21 gauge needle.  I cemented in the components and removed all excess cement.  The polyethylene tibial component was snapped into place and the knee placed in extension while cement was hardening.  The capsule was infilltrated with 30cc of .25% Marcaine with epinephrine.  A hemovac was place in the joint exiting superolaterally.  A pain pump was place superomedially superficial to the arthrotomy.  Once the cement was hard, the tourniquet was let down.  Hemostasis was obtained.  The arthrotomy was closed with figure-8 #1 vicryl sutures.  The deep soft tissues were closed with #0 vicryls and the subcuticular layer closed with a running #2-0 vicryl.  The skin was reapproximated and closed with skin staples.  The wound was dressed with xeroform, 4 x4's, 2 ABD sponges, a single layer of webril and a TED stocking.   The patient was then awakened, extubated, and taken to the recovery room in stable condition.  BLOOD LOSS:  300cc DRAINS: 1 hemovac, 1 pain catheter COMPLICATIONS:  None.  PLAN OF CARE: Admit to inpatient   PATIENT DISPOSITION:  PACU - hemodynamically stable.   Delay start of Pharmacological VTE agent (>24hrs) due to surgical blood loss or  risk of bleeding:  not applicable  Please fax a copy of this op note to my office at [phone] (please only include page 1 and 2 of the Case Information op note)

## 2014-03-25 NOTE — Progress Notes (Signed)
Orthopedic Tech Progress Note Patient Details:  Peggy Santiago 10-10-1950 [id number]  CPM Right Knee CPM Right Knee: On Right Knee Flexion (Degrees): 90 Right Knee Extension (Degrees): 0 Additional Comments: Trapeze bar and foot roll   Cammer, Theodoro Parma 03/25/2014, 2:29 PM

## 2014-03-25 NOTE — Anesthesia Procedure Notes (Addendum)
Anesthesia Regional Block:  Adductor canal block  Pre-Anesthetic Checklist: ,, timeout performed, Correct Patient, Correct Site, Correct Laterality, Correct Procedure, Correct Position, site marked, Risks and benefits discussed,  Surgical consent,  Pre-op evaluation,  At surgeon's request and post-op pain management  Laterality: Right  Prep: chloraprep       Needles:  Injection technique: Single-shot  Needle Type: Echogenic Stimulator Needle     Needle Length: 9cm 9 cm Needle Gauge: 22 and 22 G    Additional Needles:  Procedures: ultrasound guided (picture in chart) Adductor canal block Narrative:  Start time: 03/25/2014 9:55 AM End time: 03/25/2014 10:00 AM Injection made incrementally with aspirations every 5 mL.  Performed by: Personally   Additional Notes: 30 cc 0.5% Marcaine with 1:200 Epi injected easily  Roberts Gaudy, MD   Procedure Name: LMA Insertion Date/Time: 03/25/2014 10:24 AM Performed by: Storm Frisk E Pre-anesthesia Checklist: Patient identified, Timeout performed, Emergency Drugs available, Suction available and Patient being monitored Patient Re-evaluated:Patient Re-evaluated prior to inductionOxygen Delivery Method: Circle system utilized Preoxygenation: Pre-oxygenation with 100% oxygen Intubation Type: IV induction LMA: LMA inserted LMA Size: 4.0 Number of attempts: 2 Tube secured with: Tape Dental Injury: Teeth and Oropharynx as per pre-operative assessment

## 2014-03-25 NOTE — H&P (Signed)
Peggy Santiago MRN:  [id number] DOB/SEX:  12/02/1950/female  CHIEF COMPLAINT:  Painful right Knee  HISTORY: Patient is a 63 y.o. female presented with a history of pain in the right knee. Onset of symptoms was gradual starting several years ago with gradually worsening course since that time. Prior procedures on the knee include none. Patient has been treated conservatively with over-the-counter NSAIDs and activity modification. Patient currently rates pain in the knee at 10 out of 10 with activity. There is pain at night.  PAST MEDICAL HISTORY: Patient Active Problem List   Diagnosis Date Noted  . Type II or unspecified type diabetes mellitus without mention of complication, not stated as uncontrolled 02/23/2014  . Carpal tunnel syndrome 02/02/2013  . Hypercholesterolemia 04/17/2012   Past Medical History  Diagnosis Date  . Allergy   . Chronic kidney disease     kidney stones  . Osteoarthritis of knee   . Osteoarthritis of knee   . Anemia   . PONV (postoperative nausea and vomiting)    Past Surgical History  Procedure Laterality Date  . Appendectomy    . Cholecystectomy    . Abdominal hysterectomy      Partial  . Colon resection due to sbo    . Eye surgery    . Joint replacement      2 scopes rt knee  . Ankle surgery      with plate     MEDICATIONS:   No prescriptions prior to admission    ALLERGIES:   Allergies  Allergen Reactions  . Contrast Media [Iodinated Diagnostic Agents] Hives, Rash and Other (See Comments)    ALL OVER THE BODY    REVIEW OF SYSTEMS:  A comprehensive review of systems was negative.   FAMILY HISTORY:   Family History  Problem Relation Age of Onset  . Heart disease Mother   . Heart disease Father   . Diabetes Sister   . Diabetes Sister   . Heart disease Sister   . Cancer Brother     SOCIAL HISTORY:   History  Substance Use Topics  . Smoking status: Never Smoker   . Smokeless tobacco: Not on file  . Alcohol Use: No      EXAMINATION:  Vital signs in last 24 hours:    General appearance: alert, cooperative and no distress Lungs: clear to auscultation bilaterally Heart: regular rate and rhythm, S1, S2 normal, no murmur, click, rub or gallop Abdomen: soft, non-tender; bowel sounds normal; no masses,  no organomegaly Extremities: extremities normal, atraumatic, no cyanosis or edema and Homans sign is negative, no sign of DVT Pulses: 2+ and symmetric Skin: Skin color, texture, turgor normal. No rashes or lesions Neurologic: Alert and oriented X 3, normal strength and tone. Normal symmetric reflexes. Normal coordination and gait  Musculoskeletal:  ROM 0-110, Ligaments intact,  Imaging Review Plain radiographs demonstrate severe degenerative joint disease of the right knee. The overall alignment is significant varus. The bone quality appears to be good for age and reported activity level.  Assessment/Plan: End stage arthritis, right knee   The patient history, physical examination and imaging studies are consistent with advanced degenerative joint disease of the right knee. The patient has failed conservative treatment.  The clearance notes were reviewed.  After discussion with the patient it was felt that Total Knee Replacement was indicated. The procedure,  risks, and benefits of total knee arthroplasty were presented and reviewed. The risks including but not limited to aseptic loosening, infection, blood clots, vascular  injury, stiffness, patella tracking problems complications among others were discussed. The patient acknowledged the explanation, agreed to proceed with the plan.  Tarry Fountain 03/25/2014, 7:01 AM

## 2014-03-25 NOTE — Transfer of Care (Signed)
Immediate Anesthesia Transfer of Care Note  Patient: Peggy Santiago  Procedure(s) Performed: Procedure(s): TOTAL KNEE ARTHROPLASTY (Right)  Patient Location: PACU  Anesthesia Type:General  Level of Consciousness: sedated  Airway & Oxygen Therapy: Patient Spontanous Breathing and Patient connected to nasal cannula oxygen  Post-op Assessment: Report given to PACU RN and Post -op Vital signs reviewed and stable  Post vital signs: Reviewed and stable  Complications: No apparent anesthesia complications

## 2014-03-25 NOTE — Transfer of Care (Signed)
Immediate Anesthesia Transfer of Care Note  Patient: Peggy Santiago  Procedure(s) Performed: Procedure(s): TOTAL KNEE ARTHROPLASTY (Right)  Patient Location: PACU  Anesthesia Type:General  Level of Consciousness: lethargic and responds to stimulation  Airway & Oxygen Therapy: Patient Spontanous Breathing and Patient connected to nasal cannula oxygen  Post-op Assessment: Report given to PACU RN and Post -op Vital signs reviewed and stable  Post vital signs: Reviewed and stable  Complications: No apparent anesthesia complications

## 2014-03-26 LAB — CBC
HCT: 33.1 % — ABNORMAL LOW (ref 36.0–46.0)
HEMOGLOBIN: 11.2 g/dL — AB (ref 12.0–15.0)
MCH: 29.7 pg (ref 26.0–34.0)
MCHC: 33.8 g/dL (ref 30.0–36.0)
MCV: 87.8 fL (ref 78.0–100.0)
PLATELETS: 219 10*3/uL (ref 150–400)
RBC: 3.77 MIL/uL — AB (ref 3.87–5.11)
RDW: 13 % (ref 11.5–15.5)
WBC: 8.8 10*3/uL (ref 4.0–10.5)

## 2014-03-26 LAB — BASIC METABOLIC PANEL
Anion gap: 14 (ref 5–15)
BUN: 13 mg/dL (ref 6–23)
CO2: 24 mEq/L (ref 19–32)
Calcium: 9 mg/dL (ref 8.4–10.5)
Chloride: 104 mEq/L (ref 96–112)
Creatinine, Ser: 0.82 mg/dL (ref 0.50–1.10)
GFR calc Af Amer: 88 mL/min — ABNORMAL LOW (ref >90)
GFR calc non Af Amer: 76 mL/min — ABNORMAL LOW (ref >90)
Glucose, Bld: 117 mg/dL — ABNORMAL HIGH (ref 70–99)
Potassium: 4.4 mEq/L (ref 3.7–5.3)
Sodium: 142 mEq/L (ref 137–147)

## 2014-03-26 MED ORDER — OXYCODONE HCL ER 10 MG PO T12A: 10.0000 mg | EXTENDED_RELEASE_TABLET | Freq: Two times a day (BID) | ORAL | Status: DC

## 2014-03-26 MED ORDER — ENOXAPARIN SODIUM 40 MG/0.4ML ~~LOC~~ SOLN: 40.0000 mg | SUBCUTANEOUS | Status: DC

## 2014-03-26 MED ORDER — OXYCODONE HCL 5 MG PO TABS: 5.0000 mg | ORAL_TABLET | ORAL | Status: DC | PRN

## 2014-03-26 MED ORDER — METHOCARBAMOL 500 MG PO TABS: 500.0000 mg | ORAL_TABLET | Freq: Four times a day (QID) | ORAL | Status: DC | PRN

## 2014-03-26 NOTE — Progress Notes (Signed)
CARE MANAGEMENT NOTE 03/26/2014  Patient:  Peggy Santiago, Peggy Santiago   Account Number:  [id number]  Date Initiated:  03/26/2014  Documentation initiated by:  [facility]  Subjective/Objective Assessment:   admitted s/p rt TKA     Action/Plan:   PT/OT evals- recommended HHPT   Anticipated DC Date:  03/26/2014   Anticipated DC Plan:  [place]  CM consult      [facility] Choice  [place]   Choice offered to / List presented to:  C-1 Patient   DME arranged  3-N-1  [place]      DME agency  [employer]     [place] arranged  HH-2 PT      [place].   Status of service:  Completed, signed off Medicare Important Message given?   (If response is "NO", the following Medicare IM given date fields will be blank) Date Medicare IM given:   Medicare IM given by:   Date Additional Medicare IM given:   Additional Medicare IM given by:    Discharge Disposition:  [place]  Per UR Regulation:    If discussed at Long Length of Stay Meetings, dates discussed:    Comments:  03/26/14 Set up with [facility] for HHPT by MD office, confirmed with Stanton Kidney at [place]. [employer] delivered CPM, 3N1 and rolling walker to patient's home. Spoke with patient and her daughter, no change in d/c plans. Fuller Plan RN,BSN, CCM

## 2014-03-26 NOTE — Progress Notes (Signed)
Physical Therapy Treatment Patient Details Name: Peggy Santiago MRN: [id number] DOB: Jan 17, 1951 Today's Date: 03/26/2014    History of Present Illness Pt. with R TKA secondary to OA. Pt. PMH:R ankle sx with plate, DM, R carpel tunnel, 2 R knee scopes, colon resection secondary to SBO.    PT Comments    Patient continues to progress well towards physical therapy goals, increasing ambulatory distance to 120 feet this afternoon with supervision for safety. Reviewed therapeutic exercises and patient has no further questions concerning mobility at this time. She continues to be adequate for d/c from PT standpoint when medically ready. Patient will continue to benefit from skilled physical therapy services at home with HHPT to further improve independence with functional mobility.   Follow Up Recommendations  Home health PT;Supervision for mobility/OOB     Equipment Recommendations  None recommended by PT    Recommendations for Other Services       Precautions / Restrictions Precautions Precautions: Knee Precaution Comments: Reviewed knee precautions Restrictions Weight Bearing Restrictions: Yes RLE Weight Bearing: Weight bearing as tolerated    Mobility  Bed Mobility Overal bed mobility: Needs Assistance Bed Mobility: Supine to Sit;Sit to Supine     Supine to sit: Min guard Sit to supine: Min guard   General bed mobility comments: Min guard for safety to enter/exit bed. Further instructions for LLE to support RLE into bed.  Transfers Overall transfer level: Needs assistance Equipment used: Rolling walker (2 wheeled) Transfers: Sit to/from Stand Sit to Stand: Min guard         General transfer comment: Min guard for safety from lowest bed setting. VC for hand placement. good stability upon standing with rolling walker.  Ambulation/Gait Ambulation/Gait assistance: Supervision Ambulation Distance (Feet): 120 Feet Assistive device: Rolling walker (2 wheeled) Gait  Pattern/deviations: Step-to pattern;Step-through pattern;Decreased step length - left;Decreased stance time - right;Antalgic   Gait velocity interpretation: Below normal speed for age/gender General Gait Details: Focused on step-through gait pattern. Able to continuously push RW towards end of distance. No instances of buckling noted. Intermittent cues for forward gaze.   Stairs            Wheelchair Mobility    Modified Rankin (Stroke Patients Only)       Balance                                    Cognition Arousal/Alertness: Awake/alert Behavior During Therapy: WFL for tasks assessed/performed Overall Cognitive Status: Within Functional Limits for tasks assessed                      Exercises Total Joint Exercises Ankle Circles/Pumps: AROM;Both;10 reps;Seated Quad Sets: AROM;Right;10 reps;Seated Short Arc Quad: AROM;Right;10 reps;Supine Heel Slides: AAROM;Right;10 reps;Supine Hip ABduction/ADduction: Strengthening;Right;10 reps;Supine Straight Leg Raises: Strengthening;Right;10 reps;Supine Long Arc Quad: AAROM;Right;10 reps;Seated    General Comments        Pertinent Vitals/Pain Pain Assessment: 0-10 Pain Score:  (No value given) Pain Location: Rt knee Pain Intervention(s): Limited activity within patient's tolerance;Monitored during session;Repositioned    Home Living                      Prior Function            PT Goals (current goals can now be found in the care plan section) Acute Rehab PT Goals Patient Stated Goal: Go home today PT Goal Formulation:  With patient Time For Goal Achievement: 04/02/14 Potential to Achieve Goals: Good Progress towards PT goals: Progressing toward goals    Frequency  7X/week    PT Plan Current plan remains appropriate    Co-evaluation             End of Session   Activity Tolerance: Patient tolerated treatment well Patient left: with call bell/phone within reach;in  bed;with family/visitor present     Time: 6440-3474 PT Time Calculation (min): 23 min  Charges:  $Gait Training: 8-22 mins $Therapeutic Exercise: 8-22 mins                    G Codes:      [employer], Peggy Santiago  Peggy Santiago 03/26/2014, 3:44 PM

## 2014-03-26 NOTE — Evaluation (Signed)
Occupational Therapy Evaluation Patient Details Name: Peggy Santiago MRN: [id number] DOB: 10/27/1950 Today's Date: 03/26/2014    History of Present Illness Pt. with R TKA secondary to OA. Pt. PMH:R ankle sx with plate, DM, R carpel tunnel, 2 R knee scopes, colon resection secondary to SBO.   Clinical Impression   Pt. Plans to d/c home today with family A. Pt. Was able to perform transfer to [facility] and recliner chair with Min/MOd A with therapist A. Pt. States she does not want dressing equipment secondary to pt. Family can A. Pt. Has BSC at home and does not want shower seat. Pt. Should progress well at home with family A and no other skilled OT is needed at this time.     Follow Up Recommendations  No OT follow up    Equipment Recommendations  None recommended by OT    Recommendations for Other Services       Precautions / Restrictions Precautions Precautions: Knee Restrictions Weight Bearing Restrictions: Yes Other Position/Activity Restrictions:  (WBAT)      Mobility Bed Mobility Overal bed mobility: Needs Assistance Bed Mobility: Supine to Sit     Supine to sit: Mod assist        Transfers                      Balance                                            ADL Overall ADL's : Needs assistance/impaired Eating/Feeding: Independent   Grooming: Wash/dry hands;Wash/dry face;Set up   Upper Body Bathing: Supervision/ safety;Set up   Lower Body Bathing: Minimal assistance;Moderate assistance   Upper Body Dressing : Set up   Lower Body Dressing: Maximal assistance   Toilet Transfer: Minimal assistance;Moderate assistance           Functional mobility during ADLs: Minimal assistance;Moderate assistance General ADL Comments: Pt. was able to clean front peri area post  toileting.      Vision                     Perception     Praxis      Pertinent Vitals/Pain Pain Assessment: 0-10 Pain Score: 6  Pain  Location:  (R knee) Pain Descriptors / Indicators: Aching Pain Intervention(s): Monitored during session;Premedicated before session     Hand Dominance     Extremity/Trunk Assessment Upper Extremity Assessment Upper Extremity Assessment: Overall WFL for tasks assessed           Communication Communication Communication: No difficulties   Cognition Arousal/Alertness: Awake/alert Behavior During Therapy: WFL for tasks assessed/performed Overall Cognitive Status: Within Functional Limits for tasks assessed                     General Comments       Exercises       Shoulder Instructions      Home Living Family/patient expects to be discharged to:: Private residence Living Arrangements: Spouse/significant other Available Help at Discharge: Available 24 hours/day Type of Home: House Home Access: Stairs to enter [employer] of Steps:  (5-6)   Home Layout: One level     Bathroom Shower/Tub: Occupational psychologist: Standard Bathroom Accessibility: Yes How Accessible: Accessible via walker Home Equipment: [place] - 2 wheels;Bedside commode  Prior Functioning/Environment Level of Independence: Independent             OT Diagnosis:     OT Problem List:     OT Treatment/Interventions:      OT Goals(Current goals can be found in the care plan section) Acute Rehab OT Goals Patient Stated Goal:  (go home) OT Goal Formulation: With patient  OT Frequency:     Barriers to D/C:            Co-evaluation              End of Session CPM Right Knee CPM Right Knee: On Right Knee Flexion (Degrees): 60 Right Knee Extension (Degrees): 0 Additional Comments: Trapeze bar and foot roll Nurse Communication: Other (comment)  Activity Tolerance: Patient tolerated treatment well Patient left: in chair;with call bell/phone within reach;with family/visitor present   Time: 3614-4315 OT Time Calculation (min): 53  min Charges:  OT General Charges $OT Visit: 1 Procedure OT Evaluation $Initial OT Evaluation Tier I: 1 Procedure OT Treatments $Self Care/Home Management : 23-37 mins $Therapeutic Activity: 8-22 mins G-Codes:    Annie Saephan 04-02-2014, 9:48 AM

## 2014-03-26 NOTE — Evaluation (Signed)
Physical Therapy Evaluation Patient Details Name: Peggy Santiago MRN: [id number] DOB: 11/30/1950 Today's Date: 03/26/2014   History of Present Illness  Pt. with R TKA secondary to OA. Pt. PMH:R ankle sx with plate, DM, R carpel tunnel, 2 R knee scopes, colon resection secondary to SBO.  Clinical Impression  Pt is s/p right TKA resulting in the deficits listed below (see PT Problem List). She was able to ambulate at a supervision level with a rolling walker and safely completed stair training. Patient is eager to return home today and from our evaluation I feel she is adequate for d/c when medically ready, as she will have 24 hour care from family. Will continue to follow acutely until d/c. Pt will benefit from continued skilled PT at home with HHPT to increase their independence and safety with mobility to allow discharge to the venue listed below.     Follow Up Recommendations Home health PT;Supervision for mobility/OOB    Equipment Recommendations  None recommended by PT    Recommendations for Other Services       Precautions / Restrictions Precautions Precautions: Knee Precaution Comments: Reviewed knee precautions Restrictions Weight Bearing Restrictions: Yes RLE Weight Bearing: Weight bearing as tolerated Other Position/Activity Restrictions:  (WBAT)      Mobility  Bed Mobility Overal bed mobility: Needs Assistance Bed Mobility: Supine to Sit     Supine to sit: Min assist     General bed mobility comments: Min assist for RLE support. Educated to use LLE for support to bring off of bed.  Transfers Overall transfer level: Needs assistance Equipment used: Rolling walker (2 wheeled) Transfers: Sit to/from Stand Sit to Stand: Min guard         General transfer comment: Min guard for safety. VC for hand placement. Performed from lowest bed setting and recliner x3. Good stability upon standing with rolling walker  Ambulation/Gait Ambulation/Gait assistance:  Supervision Ambulation Distance (Feet): 60 Feet Assistive device: Rolling walker (2 wheeled) Gait Pattern/deviations: Step-to pattern;Step-through pattern;Decreased step length - left;Decreased stance time - right;Antalgic   Gait velocity interpretation: Below normal speed for age/gender General Gait Details: Educated on safe DME use with rolling walker. VC for step through gait pattern and sequencing. Good control of rolling walker with no instances of Rt knee buckling.  Stairs Stairs: Yes Stairs assistance: Min guard Stair Management: One rail Left;Step to pattern;Sideways Number of Stairs: 2 (x2) General stair comments: Demonstrated to patient prior to having her practice. Able to complete safely but very slow and cautious. VC for sequencing. Pt able to teach back correct technique on second attempt.  Wheelchair Mobility    Modified Rankin (Stroke Patients Only)       Balance Overall balance assessment: Needs assistance Sitting-balance support: No upper extremity supported;Feet supported Sitting balance-Leahy Scale: Good     Standing balance support: No upper extremity supported Standing balance-Leahy Scale: Fair                               Pertinent Vitals/Pain Pain Assessment: 0-10 Pain Score: 7  Pain Location: Rt knee Pain Descriptors / Indicators: Aching Pain Intervention(s): Limited activity within patient's tolerance;Monitored during session;Premedicated before session;Repositioned    Home Living Family/patient expects to be discharged to:: Private residence Living Arrangements: Spouse/significant other Available Help at Discharge: Available 24 hours/day Type of Home: House Home Access: Stairs to enter Entrance Stairs-Rails: Left Entrance Stairs-Number of Steps: 5 Home Layout: One level Home Equipment:  Walker - 2 wheels (3-in-1)      Prior Function Level of Independence: Independent               Hand Dominance         Extremity/Trunk Assessment   Upper Extremity Assessment: Defer to OT evaluation           Lower Extremity Assessment: RLE deficits/detail RLE Deficits / Details: Decreased strength and ROM as expected post op       Communication   Communication: No difficulties  Cognition Arousal/Alertness: Awake/alert Behavior During Therapy: WFL for tasks assessed/performed Overall Cognitive Status: Within Functional Limits for tasks assessed                      General Comments      Exercises Total Joint Exercises Ankle Circles/Pumps: AROM;Both;10 reps;Seated Quad Sets: AROM;Right;10 reps;Seated Knee Flexion: AAROM;Right;10 reps;Seated Goniometric ROM: 14-74 degrees right knee flexion      Assessment/Plan    PT Assessment Patient needs continued PT services  PT Diagnosis Difficulty walking;Abnormality of gait;Acute pain   PT Problem List Decreased strength;Decreased range of motion;Decreased activity tolerance;Decreased balance;Decreased mobility;Decreased knowledge of use of DME;Decreased knowledge of precautions;Pain  PT Treatment Interventions DME instruction;Gait training;Stair training;Functional mobility training;Therapeutic activities;Therapeutic exercise;Balance training;Neuromuscular re-education;Patient/family education;Modalities   PT Goals (Current goals can be found in the Care Plan section) Acute Rehab PT Goals Patient Stated Goal: Go home today PT Goal Formulation: With patient Time For Goal Achievement: 04/02/14 Potential to Achieve Goals: Good    Frequency 7X/week   Barriers to discharge        Co-evaluation               End of Session   Activity Tolerance: Patient tolerated treatment well Patient left: with call bell/phone within reach;in bed;with family/visitor present Nurse Communication: Mobility status         Time: 8916-9450 PT Time Calculation (min): 48 min   Charges:   PT Evaluation $Initial PT Evaluation Tier I: 1  Procedure PT Treatments $Gait Training: 8-22 mins $Therapeutic Exercise: 8-22 mins $Therapeutic Activity: 8-22 mins   PT G Codes:         Elayne Snare, [place]  Ellouise Newer 03/26/2014, 12:34 PM

## 2014-03-26 NOTE — Progress Notes (Signed)
Utilization review completed.  

## 2014-03-26 NOTE — Discharge Instructions (Signed)
Diet: As you were doing prior to hospitalization   Activity:  Increase activity slowly as tolerated                  No lifting or driving for 6 weeks  Shower:  May shower without a dressing once there is no drainage from your wound.                 Do NOT wash over the wound.                 Dressing:  You may change your dressing on Wednesday                    Then change the dressing daily with sterile 4"x4"s gauze dressing                     And TED hose for knees.  Weight Bearing:  Weight bearing as tolerated as taught in physical therapy.  Use a                                walker or Crutches as instructed.  To prevent constipation: you may use a stool softener such as -               Colace ( over the counter) 100 mg by mouth twice a day                Drink plenty of fluids ( prune juice may be helpful) and high fiber foods                Miralax ( over the counter) for constipation as needed.    Precautions:  If you experience chest pain or shortness of breath - call 911 immediately               For transfer to the hospital emergency department!!               If you develop a fever greater that 101 F, purulent drainage from wound,                             increased redness or drainage from wound, or calf pain -- Call the office.  Follow- Up Appointment:  Please call for an appointment to be seen on 04/09/14                                              [facility] office:  [phone]            [address] [place], [place] [address]

## 2014-03-26 NOTE — Progress Notes (Signed)
SPORTS MEDICINE AND JOINT REPLACEMENT  Lara Mulch, MD   Carlynn Spry, PA-C [place], [place], [place]  [address]                             [phone]   PROGRESS NOTE  Subjective:  negative for Chest Pain  negative for Shortness of Breath  negative for Nausea/Vomiting   negative for Calf Pain  negative for Bowel Movement   Tolerating Diet: yes         Patient reports pain as 6 on 0-10 scale.    Objective: Vital signs in last 24 hours:   Patient Vitals for the past 24 hrs:  BP Temp Temp src Pulse Resp SpO2  03/26/14 0730 128/62 mmHg 97.4 F (36.3 C) Oral 74 12 95 %  03/26/14 0503 121/63 mmHg 98.6 F (37 C) - 77 18 97 %  03/26/14 0400 - - - - 18 97 %  03/26/14 0000 - - - - 20 94 %  03/25/14 2052 107/60 mmHg 98.3 F (36.8 C) - 71 20 94 %  03/25/14 2000 - - - - 20 94 %  03/25/14 1854 137/60 mmHg 98.3 F (36.8 C) Oral 86 20 95 %  03/25/14 1820 143/73 mmHg - - 82 25 92 %  03/25/14 1650 122/72 mmHg - - 84 20 88 %  03/25/14 1635 117/76 mmHg - - 93 15 87 %  03/25/14 1620 115/71 mmHg - - 91 15 91 %  03/25/14 1605 112/75 mmHg - - 95 15 95 %  03/25/14 1550 86/56 mmHg - - 100 23 95 %  03/25/14 1540 - - - 93 20 91 %  03/25/14 1535 122/74 mmHg - - 93 15 94 %  03/25/14 1525 129/76 mmHg - - 100 15 96 %  03/25/14 1505 136/80 mmHg - - 97 21 96 %  03/25/14 1450 121/74 mmHg - - 96 19 94 %  03/25/14 1435 132/71 mmHg - - 97 12 89 %  03/25/14 1420 128/72 mmHg - - 100 18 96 %  03/25/14 1405 132/78 mmHg - - 101 16 96 %  03/25/14 1350 136/78 mmHg - - 102 16 96 %  03/25/14 1335 157/136 mmHg - - 106 20 96 %  03/25/14 1315 142/76 mmHg - - 104 19 99 %  03/25/14 1300 132/79 mmHg - - 109 17 100 %    @flow {1959:LAST@   Intake/Output from previous day:   09/14 0701 - 09/15 0700 In: 1860 [P.O.:60; I.V.:1800] Out: 50    Intake/Output this shift:   09/15 0701 - 09/15 1900 In: 720 [P.O.:720] Out: 225 [Drains:225]   Intake/Output     09/14 0701 - 09/15 0700 09/15 0701 -  09/16 0700   P.O. 60 720   I.V. (mL/kg) 1800 (22.3)    Total Intake(mL/kg) 1860 (23) 720 (8.9)   Drains  225   Blood 50    Total Output 50 225   Net +1810 +495        Urine Occurrence 1 x       LABORATORY DATA:  Recent Labs  03/25/14 1954 03/26/14 0507  WBC 9.2 8.8  HGB 11.9* 11.2*  HCT 34.8* 33.1*  PLT 237 219    Recent Labs  03/25/14 1954 03/26/14 0507  NA  --  142  K  --  4.4  CL  --  104  CO2  --  24  BUN  --  13  CREATININE 0.74 0.82  GLUCOSE  --  117*  CALCIUM  --  9.0   Lab Results  Component Value Date   INR 0.95 03/15/2014    Examination:  General appearance: alert, cooperative and no distress Extremities: Homans sign is negative, no sign of DVT  Wound Exam: clean, dry, intact   Drainage:  Scant/small amount Serosanguinous exudate  Motor Exam: EHL and FHL Intact  Sensory Exam: Deep Peroneal normal   Assessment:    1 Day Post-Op  Procedure(s) (LRB): TOTAL KNEE ARTHROPLASTY (Right)  ADDITIONAL DIAGNOSIS:  Active Problems:   S/P total knee arthroplasty  Acute Blood Loss Anemia   Plan: Physical Therapy as ordered Weight Bearing as Tolerated (WBAT)  DVT Prophylaxis:  Lovenox  DISCHARGE PLAN: Home  DISCHARGE NEEDS: HHPT, CPM, Walker and 3-in-1 comode seat         Dorthia Tout 03/26/2014, 12:46 PM

## 2014-03-28 ENCOUNTER — Encounter (HOSPITAL_COMMUNITY): Payer: Self-pay | Admitting: Orthopedic Surgery

## 2014-03-28 NOTE — Discharge Summary (Signed)
SPORTS MEDICINE & JOINT REPLACEMENT   Lara Mulch, MD   Carlynn Spry, PA-C [place], [place], [place]  [address]                             [phone]  PATIENT ID: Peggy Santiago        MRN:  [id number]          DOB/AGE: 1951/06/27 / 63 y.o.    DISCHARGE SUMMARY  ADMISSION DATE:    03/25/2014 DISCHARGE DATE:  03/26/2014  ADMISSION DIAGNOSIS: osteoarthritis right knee    DISCHARGE DIAGNOSIS:  osteoarthritis right knee    ADDITIONAL DIAGNOSIS: Active Problems:   S/P total knee arthroplasty  Past Medical History  Diagnosis Date  . Allergy   . Chronic kidney disease     kidney stones  . Osteoarthritis of knee   . Osteoarthritis of knee   . Anemia   . PONV (postoperative nausea and vomiting)   . Diabetes mellitus without complication     borderline    PROCEDURE: Procedure(s): TOTAL KNEE ARTHROPLASTY on 03/25/2014  CONSULTS:     HISTORY:  See H&P in chart  HOSPITAL COURSE:  Peggy Santiago is a 63 y.o. admitted on 03/25/2014 and found to have a diagnosis of osteoarthritis right knee.  After appropriate laboratory studies were obtained  they were taken to the operating room on 03/25/2014 and underwent Procedure(s): TOTAL KNEE ARTHROPLASTY.   They were given perioperative antibiotics:  Anti-infectives   Start     Dose/Rate Route Frequency Ordered Stop   03/25/14 1930  ceFAZolin (ANCEF) IVPB 2 g/50 mL premix     2 g 100 mL/hr over 30 Minutes Intravenous Every 6 hours 03/25/14 1854 03/26/14 0157   03/25/14 0600  ceFAZolin (ANCEF) IVPB 2 g/50 mL premix     2 g 100 mL/hr over 30 Minutes Intravenous On call to O.R. 03/24/14 1352 03/25/14 1014    .  Tolerated the procedure well.  Placed with a foley intraoperatively.  Given Ofirmev at induction and for 48 hours.    POD# 1: Vital signs were stable.  Patient denied Chest pain, shortness of breath, or calf pain.  Patient was started on Lovenox 30 mg subcutaneously twice daily at 8am.  Consults to PT, OT,  and care management were made.  The patient was weight bearing as tolerated.  CPM was placed on the operative leg 0-90 degrees for 6-8 hours a day.  Incentive spirometry was taught.  Dressing was changed.  Hemovac was discontinued.      POD #2, Continued  PT for ambulation and exercise program.  IV saline locked.  O2 discontinued.    The remainder of the hospital course was dedicated to ambulation and strengthening.   The patient was discharged on 1 day post op in  Good condition.  Blood products given:none  DIAGNOSTIC STUDIES: Recent vital signs: No data found.      Recent laboratory studies:  Recent Labs  03/25/14 1954 03/26/14 0507  WBC 9.2 8.8  HGB 11.9* 11.2*  HCT 34.8* 33.1*  PLT 237 219    Recent Labs  03/25/14 1954 03/26/14 0507  NA  --  142  K  --  4.4  CL  --  104  CO2  --  24  BUN  --  13  CREATININE 0.74 0.82  GLUCOSE  --  117*  CALCIUM  --  9.0   Lab Results  Component  Value Date   INR 0.95 03/15/2014     Recent Radiographic Studies :  Dg Chest 2 View  03/15/2014   CLINICAL DATA:  Preop for right knee replacement  EXAM: CHEST  2 VIEW  COMPARISON:  Chest x-ray of 08/02/2013  FINDINGS: Mild basilar linear scarring remains. No focal infiltrate or effusion is seen. The heart is borderline enlarged and stable. No adenopathy is seen. No bony abnormality is noted. Surgical clips are present in the right upper quadrant from prior cholecystectomy.  IMPRESSION: No active cardiopulmonary disease.  Stable borderline cardiomegaly.   Electronically Signed   By: Ivar Drape M.D.   On: 03/15/2014 09:02    DISCHARGE INSTRUCTIONS: Discharge Instructions   CPM    Complete by:  As directed   Continuous passive motion machine (CPM):      Use the CPM from 0 to 90 for 6-8 hours per day.      You may increase by 10 per day.  You may break it up into 2 or 3 sessions per day.      Use CPM for 2 weeks or until you are told to stop.     Call MD / Call 911    Complete by:  As  directed   If you experience chest pain or shortness of breath, CALL 911 and be transported to the hospital emergency room.  If you develope a fever above 101 F, pus (white drainage) or increased drainage or redness at the wound, or calf pain, call your surgeon's office.     Change dressing    Complete by:  As directed   Change dressing on Wednesday, then change the dressing daily with sterile 4 x 4 inch gauze dressing and apply TED hose.     Constipation Prevention    Complete by:  As directed   Drink plenty of fluids.  Prune juice may be helpful.  You may use a stool softener, such as Colace (over the counter) 100 mg twice a day.  Use MiraLax (over the counter) for constipation as needed.     Diet - low sodium heart healthy    Complete by:  As directed      Do not put a pillow under the knee. Place it under the heel.    Complete by:  As directed      Driving restrictions    Complete by:  As directed   No driving for 6 weeks     Increase activity slowly as tolerated    Complete by:  As directed      Lifting restrictions    Complete by:  As directed   No lifting for 6 weeks     TED hose    Complete by:  As directed   Use stockings (TED hose) for 3 weeks on both leg(s).  You may remove them at night for sleeping.           DISCHARGE MEDICATIONS:     Medication List    STOP taking these medications       Fish Oil 1200 MG Caps     traMADol 50 MG tablet  Commonly known as:  ULTRAM      TAKE these medications       celecoxib 200 MG capsule  Commonly known as:  CELEBREX  Take 200 mg by mouth daily.     cetirizine 10 MG tablet  Commonly known as:  ZYRTEC  Take 10 mg by mouth daily.  cyanocobalamin 1000 MCG/ML injection  Commonly known as:  (VITAMIN B-12)  Inject 1 mL (1,000 mcg total) into the muscle once. monthly     enoxaparin 40 MG/0.4ML injection  Commonly known as:  LOVENOX  Inject 0.4 mLs (40 mg total) into the skin daily.     GINKOBA 40 MG Tabs  Generic  drug:  Ginkgo Biloba  Take 40 mg by mouth daily.     methocarbamol 500 MG tablet  Commonly known as:  ROBAXIN  Take 1-2 tablets (500-1,000 mg total) by mouth every 6 (six) hours as needed for muscle spasms.     multivitamin with minerals Tabs tablet  Take 1 tablet by mouth daily.     omeprazole 20 MG capsule  Commonly known as:  PRILOSEC  Take 1 capsule (20 mg total) by mouth daily.     oxyCODONE 5 MG immediate release tablet  Commonly known as:  Oxy IR/ROXICODONE  Take 1-2 tablets (5-10 mg total) by mouth every 3 (three) hours as needed for breakthrough pain.     OxyCODONE 10 mg T12a 12 hr tablet  Commonly known as:  OXYCONTIN  Take 1 tablet (10 mg total) by mouth every 12 (twelve) hours.     Red Yeast Rice 600 MG Tabs  Take 600 mg by mouth daily.        FOLLOW UP VISIT:       Follow-up Information   Follow up with Rudean Haskell, MD. Call on 04/09/2014.   Specialty:  Orthopedic Surgery   Contact information:   [place] [address] [phone]       Follow up with [facility]. (They will contact you to schedule home physical therapy visits.)    Contact information:   [place] [address] [phone]       DISPOSITION: HOME   CONDITION:  Good   Tsuruko Murtha 03/28/2014, 8:37 AM

## 2014-12-23 ENCOUNTER — Encounter: Payer: Self-pay | Admitting: *Deleted

## 2015-01-28 ENCOUNTER — Encounter: Payer: Self-pay | Admitting: *Deleted

## 2015-03-04 ENCOUNTER — Ambulatory Visit (INDEPENDENT_AMBULATORY_CARE_PROVIDER_SITE_OTHER): Payer: 59 | Admitting: Family Medicine

## 2015-03-04 VITALS — BP 150/82 | HR 79 | Temp 98.3°F | Resp 18 | Ht 61.75 in | Wt 182.8 lb

## 2015-03-04 DIAGNOSIS — R05 Cough: Secondary | ICD-10-CM | POA: Diagnosis not present

## 2015-03-04 DIAGNOSIS — J069 Acute upper respiratory infection, unspecified: Secondary | ICD-10-CM

## 2015-03-04 DIAGNOSIS — H66011 Acute suppurative otitis media with spontaneous rupture of ear drum, right ear: Secondary | ICD-10-CM | POA: Diagnosis not present

## 2015-03-04 DIAGNOSIS — R059 Cough, unspecified: Secondary | ICD-10-CM

## 2015-03-04 MED ORDER — AMOXICILLIN-POT CLAVULANATE 875-125 MG PO TABS: 1.0000 | ORAL_TABLET | Freq: Two times a day (BID) | ORAL | Status: DC

## 2015-03-04 MED ORDER — BENZONATATE 100 MG PO CAPS: 100.0000 mg | ORAL_CAPSULE | Freq: Three times a day (TID) | ORAL | Status: DC | PRN

## 2015-03-04 MED ORDER — FLUTICASONE PROPIONATE 50 MCG/ACT NA SUSP: 2.0000 | Freq: Every day | NASAL | Status: DC

## 2015-03-04 MED ORDER — OFLOXACIN 0.3 % OT SOLN: 5.0000 [drp] | Freq: Two times a day (BID) | OTIC | Status: DC

## 2015-03-04 NOTE — Progress Notes (Signed)
Pt returned to clinic several hours after visit with Dr. Linna Darner with complaints of bloody drainage from her right ear. Noticed a few hours later the drainage from her ear was tinged red. This worried her. She has taken 1 dose of floxin otic drops and 1 dose oral augmentin so far. She denies fever, chills or worsening pain.  Right ear canal with thin, pink drainage from ear. Further inside canal there is more yellow/purulent thin discharge with lots of bubbles within. There is a TM perforation present.  I reassured patient that a little bloody drainage from the ear after a TM perforation is normal. This should slow down as TM starts to heal and antibiotics start working.  She will return if bloody drainage or pain worsens.

## 2015-03-04 NOTE — Patient Instructions (Signed)
Use the ofloxacin eardrops 5 drops in right ear twice daily  Take the Augmentin (amoxicillin/clavulanate) one pill twice daily for infection  Take the fluticasone nose spray 2 sprays each nostril twice daily for 3 days, then decrease to once daily  Take the Tessalon (benzonatate) cough pills one or 23 times daily as needed for cough  Drink plenty of fluids and get enough rest  You can take the tramadol that you have one every 6 or 8 hours as needed for ear pain.  You can take Tylenol (acetaminophen) 500 mg 2 tablets 3 times daily if needed for additional pain relief. Actually Tylenol and tramadol together to a very good job of doubling the pain relief.  Avoid driving if dizzy  Return if needed or return in 2 weeks for recheck of the perforated eardrum

## 2015-03-04 NOTE — Progress Notes (Signed)
  Subjective:  Patient ID: Peggy Santiago, female    DOB: 15-Apr-1951  Age: 64 y.o. MRN: [id number]  64 year old lady who just came back from a beach vacation. She had her husband both call upper respiratory infections. This been going on for about 5 days. She has had a sore throat, head congestion, postnasal drainage and hoarseness and a sore throat. She is coughing. She is blowing purulent material out of her nose. Yesterday she began having pain in her right ear and it started having a bubbling sensation. She does not smoke.   Objective:   Congested lady, obviously uncomfortable. Her left TM is normal. Right TM has erythema to it, difficult to see well because she has extensive bubbling in the ear canal, almost like a lot of little soap bubbles and there. Her throat is clear except for some postnasal drainage. Nose is congested. Neck supple without major nodes. Chest clear to auscultation. Heart regular without murmurs.  Assessment & Plan:   Assessment:  Upper respiratory viral infection Secondary right otitis suppurative with perforation of tympanic membrane Cough Plan:  Treat with oral antibioticand  anti-biotic eardrops    Patient Instructions  Use the ofloxacin eardrops 5 drops in right ear twice daily  Take the Augmentin (amoxicillin/clavulanate) one pill twice daily for infection  Take the fluticasone nose spray 2 sprays each nostril twice daily for 3 days, then decrease to once daily  Take the Tessalon (benzonatate) cough pills one or 23 times daily as needed for cough  Drink plenty of fluids and get enough rest  You can take the tramadol that you have one every 6 or 8 hours as needed for ear pain.  You can take Tylenol (acetaminophen) 500 mg 2 tablets 3 times daily if needed for additional pain relief. Actually Tylenol and tramadol together to a very good job of doubling the pain relief.  Avoid driving if dizzy  Return if needed or return in 2 weeks for recheck of  the perforated eardrum     HOPPER,DAVID, MD 03/04/2015

## 2015-03-20 ENCOUNTER — Ambulatory Visit (INDEPENDENT_AMBULATORY_CARE_PROVIDER_SITE_OTHER): Payer: 59 | Admitting: Family Medicine

## 2015-03-20 VITALS — BP 134/83 | HR 79 | Temp 98.7°F | Resp 16 | Ht 62.0 in | Wt 184.0 lb

## 2015-03-20 DIAGNOSIS — H6691 Otitis media, unspecified, right ear: Secondary | ICD-10-CM | POA: Diagnosis not present

## 2015-03-20 DIAGNOSIS — R739 Hyperglycemia, unspecified: Secondary | ICD-10-CM | POA: Diagnosis not present

## 2015-03-20 DIAGNOSIS — H6991 Unspecified Eustachian tube disorder, right ear: Secondary | ICD-10-CM

## 2015-03-20 DIAGNOSIS — H6981 Other specified disorders of Eustachian tube, right ear: Secondary | ICD-10-CM

## 2015-03-20 LAB — POCT CBC
GRANULOCYTE PERCENT: 48.9 % (ref 37–80)
HEMATOCRIT: 39.7 % (ref 37.7–47.9)
Hemoglobin: 12.5 g/dL (ref 12.2–16.2)
Lymph, poc: 2.5 (ref 0.6–3.4)
MCH, POC: 27.8 pg (ref 27–31.2)
MCHC: 31.5 g/dL — AB (ref 31.8–35.4)
MCV: 88.3 fL (ref 80–97)
MID (cbc): 0.4 (ref 0–0.9)
MPV: 7.6 fL (ref 0–99.8)
POC Granulocyte: 2.8 (ref 2–6.9)
POC LYMPH %: 43.6 % (ref 10–50)
POC MID %: 7.5 %M (ref 0–12)
Platelet Count, POC: 265 10*3/uL (ref 142–424)
RBC: 4.49 M/uL (ref 4.04–5.48)
RDW, POC: 14 %
WBC: 5.7 10*3/uL (ref 4.6–10.2)

## 2015-03-20 LAB — GLUCOSE, POCT (MANUAL RESULT ENTRY): POC GLUCOSE: 118 mg/dL — AB (ref 70–99)

## 2015-03-20 LAB — HEMOGLOBIN A1C: Hgb A1c MFr Bld: 5.8 % (ref 4.0–6.0)

## 2015-03-20 LAB — POCT GLYCOSYLATED HEMOGLOBIN (HGB A1C): Hemoglobin A1C: 5.8

## 2015-03-20 MED ORDER — CEFDINIR 300 MG PO CAPS: 600.0000 mg | ORAL_CAPSULE | Freq: Every day | ORAL | Status: DC

## 2015-03-20 MED ORDER — PREDNISONE 20 MG PO TABS: ORAL_TABLET | ORAL | Status: DC

## 2015-03-20 NOTE — Patient Instructions (Signed)
Take the prednisone 3 pills daily for 2 days, then 2 daily for 2 days, then 1 daily for 2 days. Best taken after breakfast.  Take Omnicef (cefdinir) one twice daily for infection  He knew using the fluticasone nose spray (Flonase)  Return if not significantly improved over the next couple of weeks

## 2015-03-20 NOTE — Progress Notes (Addendum)
Right ear stuffiness Subjective:  Patient ID: Peggy Santiago, female    DOB: 1951/04/11  Age: 64 y.o. MRN: [id number]  Patient was treated last week for an ear infection. She was given both ear drops and Augmentin orally. She has used Flonase. She continues to have problems, feeling like she is down and well.   Objective:   Left TM is normal. Right TM is still mildly red, deformed and partially retracted. Throat clear. Neck supple without significant nodes. Chest clear.  Assessment & Plan:   Assessment:  Right otitis, persistent, with eustachian tube dysfunction History of hyperglycemia    Plan:  CBC glucose and hemoglobin A1c  Results for orders placed or performed in visit on 03/20/15  POCT glucose (manual entry)  Result Value Ref Range   POC Glucose 118 (A) 70 - 99 mg/dl  POCT glycosylated hemoglobin (Hb A1C)  Result Value Ref Range   Hemoglobin A1C 5.8   POCT CBC  Result Value Ref Range   WBC 5.7 4.6 - 10.2 K/uL   Lymph, poc 2.5 0.6 - 3.4   POC LYMPH PERCENT 43.6 10 - 50 %L   MID (cbc) 0.4 0 - 0.9   POC MID % 7.5 0 - 12 %M   POC Granulocyte 2.8 2 - 6.9   Granulocyte percent 48.9 37 - 80 %G   RBC 4.49 4.04 - 5.48 M/uL   Hemoglobin 12.5 12.2 - 16.2 g/dL   HCT, POC 39.7 37.7 - 47.9 %   MCV 88.3 80 - 97 fL   MCH, POC 27.8 27 - 31.2 pg   MCHC 31.5 (A) 31.8 - 35.4 g/dL   RDW, POC 14.0 %   Platelet Count, POC 265 142 - 424 K/uL   MPV 7.6 0 - 99.8 fL     Patient Instructions  Take the prednisone 3 pills daily for 2 days, then 2 daily for 2 days, then 1 daily for 2 days. Best taken after breakfast.  Take Omnicef (cefdinir) one twice daily for infection  He knew using the fluticasone nose spray (Flonase)  Return if not significantly improved over the next couple of weeks     Yaire Kreher, MD 03/20/2015

## 2015-04-01 ENCOUNTER — Telehealth: Payer: Self-pay

## 2015-04-01 DIAGNOSIS — H6691 Otitis media, unspecified, right ear: Secondary | ICD-10-CM

## 2015-04-01 NOTE — Telephone Encounter (Signed)
Dr. Hetty Ely    Please refer patient to him for ear issues   [phone]

## 2015-04-02 NOTE — Telephone Encounter (Signed)
Patient was seen on 03/20/15 for ear problems and called on 04/01/15 requesting a referral to an ENT specialist.  Please advise. Thank you.

## 2015-04-06 NOTE — Telephone Encounter (Signed)
Per Patient request refer to Dr. Haynes Kerns re: otitis on right

## 2015-04-07 NOTE — Telephone Encounter (Signed)
Referral placed.

## 2015-04-17 ENCOUNTER — Other Ambulatory Visit: Payer: Self-pay | Admitting: Family Medicine

## 2015-05-07 ENCOUNTER — Encounter: Payer: Self-pay | Admitting: Family Medicine

## 2015-05-20 ENCOUNTER — Encounter: Payer: 59 | Admitting: Physician Assistant

## 2015-06-10 ENCOUNTER — Ambulatory Visit (INDEPENDENT_AMBULATORY_CARE_PROVIDER_SITE_OTHER): Payer: 59 | Admitting: Physician Assistant

## 2015-06-10 ENCOUNTER — Encounter: Payer: Self-pay | Admitting: Physician Assistant

## 2015-06-10 VITALS — BP 124/76 | HR 67 | Temp 98.4°F | Resp 16 | Ht 62.0 in | Wt 172.0 lb

## 2015-06-10 DIAGNOSIS — Z13220 Encounter for screening for lipoid disorders: Secondary | ICD-10-CM

## 2015-06-10 DIAGNOSIS — M47812 Spondylosis without myelopathy or radiculopathy, cervical region: Secondary | ICD-10-CM | POA: Insufficient documentation

## 2015-06-10 DIAGNOSIS — D510 Vitamin B12 deficiency anemia due to intrinsic factor deficiency: Secondary | ICD-10-CM | POA: Diagnosis not present

## 2015-06-10 DIAGNOSIS — R739 Hyperglycemia, unspecified: Secondary | ICD-10-CM

## 2015-06-10 DIAGNOSIS — N200 Calculus of kidney: Secondary | ICD-10-CM | POA: Diagnosis not present

## 2015-06-10 DIAGNOSIS — K589 Irritable bowel syndrome without diarrhea: Secondary | ICD-10-CM | POA: Diagnosis not present

## 2015-06-10 DIAGNOSIS — Z114 Encounter for screening for human immunodeficiency virus [HIV]: Secondary | ICD-10-CM | POA: Diagnosis not present

## 2015-06-10 DIAGNOSIS — G43909 Migraine, unspecified, not intractable, without status migrainosus: Secondary | ICD-10-CM | POA: Diagnosis not present

## 2015-06-10 DIAGNOSIS — Z23 Encounter for immunization: Secondary | ICD-10-CM | POA: Diagnosis not present

## 2015-06-10 DIAGNOSIS — Z0000 Encounter for general adult medical examination without abnormal findings: Secondary | ICD-10-CM | POA: Diagnosis not present

## 2015-06-10 DIAGNOSIS — M1712 Unilateral primary osteoarthritis, left knee: Secondary | ICD-10-CM | POA: Diagnosis not present

## 2015-06-10 DIAGNOSIS — Z1159 Encounter for screening for other viral diseases: Secondary | ICD-10-CM | POA: Diagnosis not present

## 2015-06-10 DIAGNOSIS — Z1329 Encounter for screening for other suspected endocrine disorder: Secondary | ICD-10-CM | POA: Diagnosis not present

## 2015-06-10 DIAGNOSIS — E785 Hyperlipidemia, unspecified: Secondary | ICD-10-CM

## 2015-06-10 DIAGNOSIS — J309 Allergic rhinitis, unspecified: Secondary | ICD-10-CM | POA: Insufficient documentation

## 2015-06-10 DIAGNOSIS — M5030 Other cervical disc degeneration, unspecified cervical region: Secondary | ICD-10-CM

## 2015-06-10 LAB — COMPREHENSIVE METABOLIC PANEL
ALT: 13 U/L (ref 6–29)
AST: 16 U/L (ref 10–35)
Albumin: 4.2 g/dL (ref 3.6–5.1)
Alkaline Phosphatase: 52 U/L (ref 33–130)
BILIRUBIN TOTAL: 0.7 mg/dL (ref 0.2–1.2)
BUN: 21 mg/dL (ref 7–25)
CO2: 26 mmol/L (ref 20–31)
Calcium: 9.3 mg/dL (ref 8.6–10.4)
Chloride: 108 mmol/L (ref 98–110)
Creat: 0.87 mg/dL (ref 0.50–0.99)
Glucose, Bld: 99 mg/dL (ref 65–99)
POTASSIUM: 4 mmol/L (ref 3.5–5.3)
Sodium: 140 mmol/L (ref 135–146)
Total Protein: 7 g/dL (ref 6.1–8.1)

## 2015-06-10 LAB — HEPATITIS C ANTIBODY: HCV Ab: NEGATIVE

## 2015-06-10 LAB — TSH: TSH: 0.611 u[IU]/mL (ref 0.350–4.500)

## 2015-06-10 LAB — LIPID PANEL
CHOL/HDL RATIO: 6.4 ratio — AB (ref <=5.0)
CHOLESTEROL: 236 mg/dL — AB (ref 125–200)
HDL: 37 mg/dL — AB (ref >=46)
LDL Cholesterol: 167 mg/dL — ABNORMAL HIGH (ref <130)
Triglycerides: 159 mg/dL — ABNORMAL HIGH (ref <150)
VLDL: 32 mg/dL — ABNORMAL HIGH (ref <30)

## 2015-06-10 LAB — HIV ANTIBODY (ROUTINE TESTING W REFLEX): HIV 1&2 Ab, 4th Generation: NONREACTIVE

## 2015-06-10 MED ORDER — CELECOXIB 200 MG PO CAPS: 200.0000 mg | ORAL_CAPSULE | Freq: Every day | ORAL | Status: DC

## 2015-06-10 MED ORDER — CYANOCOBALAMIN 1000 MCG/ML IJ SOLN: 1000.0000 ug | INTRAMUSCULAR | Status: DC

## 2015-06-10 NOTE — Progress Notes (Signed)
Patient ID: Peggy Santiago, female    DOB: 18-Oct-1950, 64 y.o.   MRN: [id number]  PCP: Lamar Blinks, MD  Chief Complaint  Patient presents with  . Annual Exam    Subjective:   HPI: Presents for annual exam.  Cervical Cancer Screening: not a candidate due to hysterectomy Breast Cancer Screening: due for mammography Colorectal Cancer Screening: reported colonoscopy in 2008. Due in 2018 Bone Density Testing: begin at age 67 HIV Screening: not performed STI Screening: not performed Seasonal Influenza Vaccination: not complete Td/Tdap Vaccination: 2009 Pneumococcal Vaccination: not indicated Zoster Vaccination: not performed  She was diagnosed with diabetes about a year ago, but doesn't believe that she has it. 2 months ago glucose was 118 and A1C was 5.8%.  Patient Active Problem List   Diagnosis Date Noted  . S/P total knee arthroplasty 03/25/2014  . Arthritis of knee, degenerative 02/26/2014  . Type II or unspecified type diabetes mellitus without mention of complication, not stated as uncontrolled 02/23/2014  . Carpal tunnel syndrome 02/02/2013  . Hypercholesterolemia 04/17/2012    Past Medical History  Diagnosis Date  . Allergy   . Chronic kidney disease     kidney stones  . Osteoarthritis of knee   . Osteoarthritis of knee   . Anemia   . PONV (postoperative nausea and vomiting)   . Diabetes mellitus without complication ([place])     borderline     Prior to Admission medications   Medication Sig Start Date End Date Taking? Authorizing Provider  celecoxib (CELEBREX) 200 MG capsule Take 200 mg by mouth daily.   Yes Historical Provider, MD  cetirizine (ZYRTEC) 10 MG tablet Take 10 mg by mouth daily.   Yes Historical Provider, MD  cyanocobalamin (,VITAMIN B-12,) 1000 MCG/ML injection Inject 1 mL (1,000 mcg total) into the muscle every 30 (thirty) days. PATIENT NEEDS OFFICE VISIT/LABS FOR ADDITIONAL REFILLS 04/18/15  Yes Gay Filler Copland, MD  Multiple Vitamin  (MULTIVITAMIN WITH MINERALS) TABS tablet Take 1 tablet by mouth daily.   Yes Historical Provider, MD  omeprazole (PRILOSEC) 20 MG capsule Take 1 capsule (20 mg total) by mouth daily. 02/12/13  Yes Gay Filler Copland, MD  fluticasone (FLONASE) 50 MCG/ACT nasal spray Place 2 sprays into both nostrils daily. Patient not taking: Reported on 06/10/2015 03/04/15   Posey Boyer, MD  traMADol (ULTRAM) 50 MG tablet Take by mouth every 6 (six) hours as needed.    Historical Provider, MD    Allergies  Allergen Reactions  . Contrast Media [Iodinated Diagnostic Agents] Hives, Rash and Other (See Comments)    ALL OVER THE BODY    Past Surgical History  Procedure Laterality Date  . Appendectomy    . Cholecystectomy    . Abdominal hysterectomy      Partial  . Colon resection due to sbo    . Eye surgery    . Joint replacement      2 scopes rt knee  . Ankle surgery      with plate  . Total knee arthroplasty Right 03/25/2014    Procedure: TOTAL KNEE ARTHROPLASTY;  Surgeon: Vickey Huger, MD;  Location: [facility];  Service: Orthopedics;  Laterality: Right;    Family History  Problem Relation Age of Onset  . Heart disease Mother   . Heart disease Father   . Diabetes Sister   . Diabetes Sister   . Heart disease Sister   . Cancer Brother     Social History   Social History  .  Marital Status: Married    Spouse Name: Annia Belt"  . Number of Children: 2  . Years of Education: 12th grade   Occupational History  . Network engineer    Social History Main Topics  . Smoking status: Never Smoker   . Smokeless tobacco: Never Used  . Alcohol Use: No  . Drug Use: No  . Sexual Activity: Yes    Birth Control/ Protection: Surgical   Other Topics Concern  . None   Social History Narrative   Lives with her husband.   Their grown daughters live locally.       Review of Systems  Constitutional: Negative.   HENT: Negative.   Eyes: Negative.   Respiratory: Negative.   Cardiovascular: Negative.     Gastrointestinal: Negative.   Genitourinary: Negative.   Musculoskeletal: Negative.   Skin: Negative.   Neurological: Negative.   Psychiatric/Behavioral: Negative.         Objective:  Physical Exam  Constitutional: She is oriented to person, place, and time. Vital signs are normal. She appears well-developed and well-nourished. She is active and cooperative. No distress.  BP 124/76 mmHg  Pulse 67  Temp(Src) 98.4 F (36.9 C) (Oral)  Resp 16  Ht 5\' 2"  (1.575 m)  Wt 172 lb (78.019 kg)  BMI 31.45 kg/m2  SpO2 95%   HENT:  Head: Normocephalic and atraumatic.  Right Ear: Hearing, tympanic membrane, external ear and ear canal normal. No foreign bodies.  Left Ear: Hearing, tympanic membrane, external ear and ear canal normal. No foreign bodies.  Nose: Nose normal.  Mouth/Throat: Uvula is midline, oropharynx is clear and moist and mucous membranes are normal. No oral lesions. Normal dentition. No dental abscesses or uvula swelling. No oropharyngeal exudate.  Eyes: Conjunctivae, EOM and lids are normal. Pupils are equal, round, and reactive to light. Right eye exhibits no discharge. Left eye exhibits no discharge. No scleral icterus.  Fundoscopic exam:      The right eye shows no arteriolar narrowing, no AV nicking, no exudate, no hemorrhage and no papilledema. The right eye shows red reflex.       The left eye shows no arteriolar narrowing, no AV nicking, no exudate, no hemorrhage and no papilledema. The left eye shows red reflex.  Neck: Trachea normal, normal range of motion and full passive range of motion without pain. Neck supple. No spinous process tenderness and no muscular tenderness present. No thyroid mass and no thyromegaly present.  Cardiovascular: Normal rate, regular rhythm, normal heart sounds, intact distal pulses and normal pulses.   Pulmonary/Chest: Effort normal and breath sounds normal.  Musculoskeletal: She exhibits no edema or tenderness.       Cervical back: Normal.        Thoracic back: Normal.       Lumbar back: Normal.  Lymphadenopathy:       Head (right side): No tonsillar, no preauricular, no posterior auricular and no occipital adenopathy present.       Head (left side): No tonsillar, no preauricular, no posterior auricular and no occipital adenopathy present.    She has no cervical adenopathy.       Right: No supraclavicular adenopathy present.       Left: No supraclavicular adenopathy present.  Neurological: She is alert and oriented to person, place, and time. She has normal strength and normal reflexes. No cranial nerve deficit. She exhibits normal muscle tone. Coordination and gait normal.  Skin: Skin is warm, dry and intact. No rash noted. She is not  diaphoretic. No cyanosis or erythema. Nails show no clubbing.  Psychiatric: She has a normal mood and affect. Her speech is normal and behavior is normal. Judgment and thought content normal.           Assessment & Plan:  1. Annual physical exam Age appropriate anticipatory guidance provided.  2. Needs flu shot - Flu Vaccine QUAD 36+ mos IM  3. Hyperglycemia Await lab. If glucose remains >100, will recommend following health screenings and maintenance for people with diabetes. If <100, will resolve diabetes from her problem list, but continue to monitor. - Comprehensive metabolic panel  4. Screening for HIV (human immunodeficiency virus) - HIV antibody  5. Need for hepatitis C screening test - Hepatitis C antibody  6. Screening for hyperlipidemia - Lipid panel  7. Screening for thyroid disorder - TSH  8. Pernicious anemia She self-administers at home. - cyanocobalamin (,VITAMIN B-12,) 1000 MCG/ML injection; Inject 1 mL (1,000 mcg total) into the muscle every 30 (thirty) days.  Dispense: 10 mL; Refill: 5  9. Primary osteoarthritis of left knee Stable. S/P RIGHT TKR. Does not plan to have procedure on the LEFT. - celecoxib (CELEBREX) 200 MG capsule; Take 1 capsule (200 mg  total) by mouth daily.  Dispense: 90 capsule; Refill: 3   Return in about 6 months (around 12/08/2015).   Fara Chute, PA-C Physician Assistant-Certified [facility]

## 2015-06-10 NOTE — Patient Instructions (Addendum)
Influenza Virus Vaccine injection What is this medicine? INFLUENZA VIRUS VACCINE (in floo EN zuh VAHY ruhs vak SEEN) helps to reduce the risk of getting influenza also known as the flu. The vaccine only helps protect you against some strains of the flu. This medicine may be used for other purposes; ask your health care provider or pharmacist if you have questions. What should I tell my health care provider before I take this medicine? They need to know if you have any of these conditions: -bleeding disorder like hemophilia -fever or infection -Guillain-Barre syndrome or other neurological problems -immune system problems -infection with the human immunodeficiency virus (HIV) or AIDS -low blood platelet counts -multiple sclerosis -an unusual or allergic reaction to influenza virus vaccine, latex, other medicines, foods, dyes, or preservatives. Different brands of vaccines contain different allergens. Some may contain latex or eggs. Talk to your doctor about your allergies to make sure that you get the right vaccine. -pregnant or trying to get pregnant -breast-feeding How should I use this medicine? This vaccine is for injection into a muscle or under the skin. It is given by a health care professional. A copy of Vaccine Information Statements will be given before each vaccination. Read this sheet carefully each time. The sheet may change frequently. Talk to your healthcare provider to see which vaccines are right for you. Some vaccines should not be used in all age groups. Overdosage: If you think you have taken too much of this medicine contact a poison control center or emergency room at once. NOTE: This medicine is only for you. Do not share this medicine with others. What if I miss a dose? This does not apply. What may interact with this medicine? -chemotherapy or radiation therapy -medicines that lower your immune system like etanercept, anakinra, infliximab, and adalimumab -medicines  that treat or prevent blood clots like warfarin -phenytoin -steroid medicines like prednisone or cortisone -theophylline -vaccines This list may not describe all possible interactions. Give your health care provider a list of all the medicines, herbs, non-prescription drugs, or dietary supplements you use. Also tell them if you smoke, drink alcohol, or use illegal drugs. Some items may interact with your medicine. What should I watch for while using this medicine? Report any side effects that do not go away within 3 days to your doctor or health care professional. Call your health care provider if any unusual symptoms occur within 6 weeks of receiving this vaccine. You may still catch the flu, but the illness is not usually as bad. You cannot get the flu from the vaccine. The vaccine will not protect against colds or other illnesses that may cause fever. The vaccine is needed every year. What side effects may I notice from receiving this medicine? Side effects that you should report to your doctor or health care professional as soon as possible: -allergic reactions like skin rash, itching or hives, swelling of the face, lips, or tongue Side effects that usually do not require medical attention (report to your doctor or health care professional if they continue or are bothersome): -fever -headache -muscle aches and pains -pain, tenderness, redness, or swelling at the injection site -tiredness This list may not describe all possible side effects. Call your doctor for medical advice about side effects. You may report side effects to FDA at 1-800-FDA-1088. Where should I keep my medicine? The vaccine will be given by a health care professional in a clinic, pharmacy, doctor's office, or other health care setting. You will not   be given vaccine doses to store at home. NOTE: This sheet is a summary. It may not cover all possible information. If you have questions about this medicine, talk to your  doctor, pharmacist, or health care provider.    2016, Elsevier/Gold Standard. (2015-01-17 10:07:28)   I will contact you with your lab results as soon as they are available.   If you have not heard from me in 2 weeks, please contact me.  The fastest way to get your results is to register for My Chart (see the instructions on the last page of this printout).  Keeping You Healthy  Get These Tests  Blood Pressure- Have your blood pressure checked by your healthcare provider at least once a year.  Normal blood pressure is 120/80.  Weight- Have your body mass index (BMI) calculated to screen for obesity.  BMI is a measure of body fat based on height and weight.  You can calculate your own BMI at [employer].it  Cholesterol- Have your cholesterol checked every year.  Diabetes- Have your blood sugar checked every year if you have high blood pressure, high cholesterol, a family history of diabetes or if you are overweight.  Pap Test - Have a pap test every 1 to 5 years if you have been sexually active.  If you are older than 65 and recent pap tests have been normal you may not need additional pap tests.  In addition, if you have had a hysterectomy  for benign disease additional pap tests are not necessary.  Mammogram-Yearly mammograms are essential for early detection of breast cancer  Screening for Colon Cancer- Colonoscopy starting at age 36. Screening may begin sooner depending on your family history and other health conditions.  Follow up colonoscopy as directed by your Gastroenterologist.  Screening for Osteoporosis- Screening begins at age 38 with bone density scanning, sooner if you are at higher risk for developing Osteoporosis.  Get these medicines  Calcium with Vitamin D- Your body requires 1200-1500 mg of Calcium a day and [phone] IU of Vitamin D a day.  You can only absorb 500 mg of Calcium at a time therefore Calcium must be taken in 2 or 3 separate doses throughout  the day.  Hormones- Hormone therapy has been associated with increased risk for certain cancers and heart disease.  Talk to your healthcare provider about if you need relief from menopausal symptoms.  Aspirin- Ask your healthcare provider about taking Aspirin to prevent Heart Disease and Stroke.  Get these Immuniztions  Flu shot- Every fall  Pneumonia shot- Once after the age of 63; if you are younger ask your healthcare provider if you need a pneumonia shot.  Tetanus- Every ten years.  Zostavax- Once after the age of 28 to prevent shingles.  Take these steps  Don't smoke- Your healthcare provider can help you quit. For tips on how to quit, ask your healthcare provider or go to www.smokefree.gov or call 1-800 QUIT-NOW.  Be physically active- Exercise 5 days a week for a minimum of 30 minutes.  If you are not already physically active, start slow and gradually work up to 30 minutes of moderate physical activity.  Try walking, dancing, bike riding, swimming, etc.  Eat a healthy diet- Eat a variety of healthy foods such as fruits, vegetables, whole grains, low fat milk, low fat cheeses, yogurt, lean meats, chicken, fish, eggs, dried beans, tofu, etc.  For more information go to www.thenutritionsource.org  Dental visit- Brush and floss teeth twice daily; visit your  dentist twice a year.  Eye exam- Visit your Optometrist or Ophthalmologist yearly.  Drink alcohol in moderation- Limit alcohol intake to one drink or less a day.  Never drink and drive.  Depression- Your emotional health is as important as your physical health.  If you're feeling down or losing interest in things you normally enjoy, please talk to your healthcare provider.  Seat Belts- can save your life; always wear one  Smoke/Carbon Monoxide detectors- These detectors need to be installed on the appropriate level of your home.  Replace batteries at least once a year.  Violence- If anyone is threatening or hurting you,  please tell your healthcare provider.  Living Will/ Health care power of attorney- Discuss with your healthcare provider and family.

## 2015-06-13 ENCOUNTER — Encounter: Payer: Self-pay | Admitting: Physician Assistant

## 2015-06-13 DIAGNOSIS — E785 Hyperlipidemia, unspecified: Secondary | ICD-10-CM | POA: Insufficient documentation

## 2015-06-13 MED ORDER — ATORVASTATIN CALCIUM 10 MG PO TABS: 10.0000 mg | ORAL_TABLET | Freq: Every day | ORAL | Status: DC

## 2015-06-13 NOTE — Addendum Note (Signed)
Addended by: Fara Chute on: 06/13/2015 12:39 PM   Modules accepted: Orders

## 2015-08-01 ENCOUNTER — Encounter: Payer: Self-pay | Admitting: Family Medicine

## 2015-08-06 ENCOUNTER — Encounter: Payer: Self-pay | Admitting: Family Medicine

## 2015-08-28 ENCOUNTER — Ambulatory Visit (INDEPENDENT_AMBULATORY_CARE_PROVIDER_SITE_OTHER): Payer: 59 | Admitting: Physician Assistant

## 2015-08-28 VITALS — BP 140/80 | HR 69 | Temp 98.6°F | Resp 20 | Ht 62.0 in | Wt 177.6 lb

## 2015-08-28 DIAGNOSIS — M25512 Pain in left shoulder: Secondary | ICD-10-CM

## 2015-08-28 MED ORDER — PREDNISONE 20 MG PO TABS: ORAL_TABLET | ORAL | Status: AC

## 2015-08-28 MED ORDER — NAPROXEN 500 MG PO TABS
500.0000 mg | ORAL_TABLET | Freq: Two times a day (BID) | ORAL | Status: DC
Start: 2015-08-28 — End: 2016-10-27

## 2015-08-28 NOTE — Progress Notes (Signed)
08/28/2015 6:39 PM   DOB: [birth year] / MRN: [id number]  SUBJECTIVE:  Peggy Santiago is a 65 y.o. female presenting for left shoulder pain.  She has tried tramadol and ibuprofen and also take Celebrex chronically.  She associates neck pain. She dose complain of some numbness but is not sure which fingers.   The pain is worse abduction.  This is a new problem.    She is allergic to contrast media.   She  has a past medical history of Allergy; Chronic kidney disease; Osteoarthritis of knee; Osteoarthritis of knee; Anemia; PONV (postoperative nausea and vomiting); and Diabetes mellitus without complication ([place]).    She  reports that she has never smoked. She has never used smokeless tobacco. She reports that she does not drink alcohol or use illicit drugs. She  reports that she currently engages in sexual activity. She reports using the following method of birth control/protection: Surgical. The patient  has past surgical history that includes Appendectomy; Cholecystectomy; colon resection due to SBO; Eye surgery; Joint replacement; Ankle surgery; Total knee arthroplasty (Right, 03/25/2014); and Abdominal hysterectomy.  Her family history includes Alzheimer's disease in her mother; Cancer in her brother; Diabetes in her sister and sister; Heart disease in her brother, father, mother, sister, and sister; Hyperlipidemia in her brother, sister, and sister; Rheum arthritis in her mother.  Review of Systems  Constitutional: Negative for fever.  Cardiovascular: Negative for chest pain.  Gastrointestinal: Negative for vomiting.  Musculoskeletal: Positive for joint pain and neck pain.  Skin: Negative for rash.  Neurological: Negative for dizziness and headaches.    Problem list and medications reviewed and updated by myself where necessary, and exist elsewhere in the encounter.   OBJECTIVE:  BP 140/80 mmHg  Pulse 69  Temp(Src) 98.6 F (37 C) (Oral)  Resp 20  Ht 5\' 2"  (1.575 m)  Wt 177 lb 9.6  oz (80.559 kg)  BMI 32.48 kg/m2  SpO2 97%  Physical Exam  Constitutional: She is oriented to person, place, and time. She appears well-nourished. No distress.  Eyes: EOM are normal. Pupils are equal, round, and reactive to light.  Cardiovascular: Normal rate.   Pulmonary/Chest: Effort normal.  Abdominal: She exhibits no distension.  Musculoskeletal:       Left shoulder: She exhibits tenderness and pain. She exhibits normal range of motion, no bony tenderness, no swelling, no spasm and normal strength.       Arms: Neurological: She is alert and oriented to person, place, and time. She has normal strength. No cranial nerve deficit or sensory deficit. She displays a negative Romberg sign. Gait normal. GCS eye subscore is 4. GCS verbal subscore is 5. GCS motor subscore is 6.  Reflex Scores:      Brachioradialis reflexes are 2+ on the right side and 2+ on the left side.      Patellar reflexes are 2+ on the right side and 2+ on the left side.      Achilles reflexes are 2+ on the right side and 2+ on the left side. Skin: Skin is dry. She is not diaphoretic.  Psychiatric: She has a normal mood and affect.  Vitals reviewed.   No results found for this or any previous visit (from the past 72 hour(s)).  No results found.  ASSESSMENT AND PLAN  Peggy Santiago was seen today for other and headache.  Diagnoses and all orders for this visit:  Left shoulder pain: I am concerned for rotator cuff pathology.  There is little utility  in radiographs.  Will refer her to orthopedics to be seen in two weeks.  Will immobilize and try prednisone given that she has already tried and failed NSAIDs.  -     predniSONE (DELTASONE) 20 MG tablet; Start this on 2/16. Take 3 in the morning for 3 days, then 2 in the morning for 3 days, and then 1 in the morning for 3 days. -     naproxen (NAPROSYN) 500 MG tablet; Take 1 tablet (500 mg total) by mouth 2 (two) times daily with a meal. Start this after finishing prednisone. -      Sling immobilizer -     AMB referral to orthopedics    The patient was advised to call or return to clinic if she does not see an improvement in symptoms or to seek the care of the closest emergency department if she worsens with the above plan.   Philis Fendt, MHS, PA-C [facility] 08/28/2015 6:39 PM

## 2015-10-01 ENCOUNTER — Telehealth: Payer: Self-pay

## 2015-10-01 NOTE — Telephone Encounter (Signed)
Pt states she feel pretty rotten with the flu and would like to know if the Dr would call her something in, pt was advised to come in to the office, but she would prefer a call back Please call [facility]

## 2015-10-02 ENCOUNTER — Ambulatory Visit (INDEPENDENT_AMBULATORY_CARE_PROVIDER_SITE_OTHER): Payer: 59 | Admitting: Physician Assistant

## 2015-10-02 ENCOUNTER — Ambulatory Visit (INDEPENDENT_AMBULATORY_CARE_PROVIDER_SITE_OTHER): Payer: 59

## 2015-10-02 VITALS — BP 137/80 | HR 107 | Temp 100.4°F | Resp 16 | Ht 62.0 in | Wt 177.0 lb

## 2015-10-02 DIAGNOSIS — R6889 Other general symptoms and signs: Secondary | ICD-10-CM | POA: Diagnosis not present

## 2015-10-02 DIAGNOSIS — R05 Cough: Secondary | ICD-10-CM

## 2015-10-02 DIAGNOSIS — R059 Cough, unspecified: Secondary | ICD-10-CM

## 2015-10-02 LAB — POCT INFLUENZA A/B
INFLUENZA A, POC: NEGATIVE
Influenza B, POC: NEGATIVE

## 2015-10-02 MED ORDER — GUAIFENESIN ER 1200 MG PO TB12: 1.0000 | ORAL_TABLET | Freq: Two times a day (BID) | ORAL | Status: AC

## 2015-10-02 MED ORDER — HYDROCOD POLST-CPM POLST ER 10-8 MG/5ML PO SUER: 5.0000 mL | Freq: Two times a day (BID) | ORAL | Status: DC | PRN

## 2015-10-02 MED ORDER — OSELTAMIVIR PHOSPHATE 75 MG PO CAPS: 75.0000 mg | ORAL_CAPSULE | Freq: Two times a day (BID) | ORAL | Status: AC

## 2015-10-02 NOTE — Telephone Encounter (Signed)
No office visit.is required.

## 2015-10-02 NOTE — Patient Instructions (Addendum)
  Please push fluids.  Tylenol and Motrin for fever and body aches.    A humidifier can help especially when the air is dry -if you do not have a humidifier you can boil a pot of water on the stove in your home to help with the dry air.    IF you received an x-ray today, you will receive an invoice from [facility]. Please contact [facility] at [phone] with questions or concerns regarding your invoice.   IF you received labwork today, you will receive an invoice from [facility] [employer]. Please contact [facility] at [phone] with questions or concerns regarding your invoice.   Our billing staff will not be able to assist you with questions regarding bills from these companies.  You will be contacted with the lab results as soon as they are available. The fastest way to get your results is to activate your My Chart account. Instructions are located on the last page of this paperwork. If you have not heard from [place] regarding the results in 2 weeks, please contact this office.

## 2015-10-02 NOTE — Progress Notes (Signed)
Peggy Santiago  MRN: [id number] DOB: [birth year]  Subjective:  The patient is a 65 year old female with HTN, nonsmoker, prediabetes, seasonal allergic rhinitis, DJD, and hx of PUD who presents with onset of non-productive cough, chills, fever, body aches, and fatigue two days ago. Also endorses ear fullness and pressure. Her grandson was diagnosed with influenza 4 days ago and she spend time with him the past weekend. States her chest and ribs are sore, inability to take a deep breath due to cough, denies overt shortness of breath or chest tightness. Patient has taken Nyquil which helped with sleep and tylenol as needed.   Patient also endorses history of ear discharge and TM perforation with continuous right ear fullness. Ear popping has increased this week since flu like symptoms have began.   Review of Systems  Constitutional: Positive for fever, chills and fatigue.  HENT: Positive for ear pain. Negative for congestion, ear discharge, postnasal drip, rhinorrhea and sinus pressure.  Respiratory: Positive for cough. Negative for chest tightness.  Cardiovascular: Negative for chest pain and palpitations.  Gastrointestinal: Negative for nausea and vomiting.  Musculoskeletal: Positive for myalgias.    Patient Active Problem List   Diagnosis Date Noted  . Hyperlipidemia 06/13/2015  . Pernicious anemia 06/10/2015  . Allergic rhinitis 06/10/2015  . IBS (irritable bowel syndrome) 06/10/2015  . Nephrolithiasis 06/10/2015  . Migraine 06/10/2015  . Cervical spondylosis without myelopathy 06/10/2015  . DDD (degenerative disc disease), cervical 06/10/2015  . S/P total knee arthroplasty 03/25/2014  . Arthritis of knee, degenerative 02/26/2014  . Type II or unspecified type diabetes mellitus without mention of complication, not stated as uncontrolled 02/23/2014  . Carpal tunnel syndrome 02/02/2013    Current Outpatient Prescriptions on File Prior to Visit  Medication Sig Dispense Refill  .  celecoxib (CELEBREX) 200 MG capsule Take 1 capsule (200 mg total) by mouth daily. 90 capsule 3  . cetirizine (ZYRTEC) 10 MG tablet Take 10 mg by mouth daily.    . cyanocobalamin (,VITAMIN B-12,) 1000 MCG/ML injection Inject 1 mL (1,000 mcg total) into the muscle every 30 (thirty) days. 10 mL 5  . fluticasone (FLONASE) 50 MCG/ACT nasal spray Place 2 sprays into both nostrils daily. 16 g 1  . Multiple Vitamin (MULTIVITAMIN WITH MINERALS) TABS tablet Take 1 tablet by mouth daily.    . naproxen (NAPROSYN) 500 MG tablet Take 1 tablet (500 mg total) by mouth 2 (two) times daily with a meal. Start this after finishing prednisone. 30 tablet 0  . omeprazole (PRILOSEC) 20 MG capsule Take 1 capsule (20 mg total) by mouth daily. 30 capsule 3  . traMADol (ULTRAM) 50 MG tablet Take by mouth every 6 (six) hours as needed. Reported on 08/28/2015     No current facility-administered medications on file prior to visit.    Allergies  Allergen Reactions  . Contrast Media [Iodinated Diagnostic Agents] Hives, Rash and Other (See Comments)    ALL OVER THE BODY    Review of Systems Objective:  BP 137/80 mmHg  Pulse 107  Temp(Src) 100.4 F (38 C) (Oral)  Resp 16  Ht 5\' 2"  (1.575 m)  Wt 177 lb (80.287 kg)  BMI 32.37 kg/m2  SpO2 94%  Physical Exam  Constitutional: She is oriented to person, place, and time and well-developed, well-nourished, and in no distress.  HENT:  Head: Normocephalic and atraumatic.  Right Ear: Hearing, tympanic membrane, external ear and ear canal normal.  Left Ear: Hearing, tympanic membrane, external ear and ear  canal normal.  Nose: Nose normal.  Mouth/Throat: Uvula is midline, oropharynx is clear and moist and mucous membranes are normal.  Eyes: Conjunctivae are normal.  Neck: Normal range of motion.  Cardiovascular: Normal rate, regular rhythm and normal heart sounds.   No murmur heard. Pulmonary/Chest: Effort normal. She has rales (mild) in the left lower field.    Neurological: She is alert and oriented to person, place, and time. Gait normal.  Skin: Skin is warm and dry.  Psychiatric: Mood, memory, affect and judgment normal.  Vitals reviewed.  Dg Chest 2 View  10/02/2015  CLINICAL DATA:  Cough. EXAM: CHEST  2 VIEW COMPARISON:  03/15/2014. FINDINGS: Mediastinum hilar structures normal. Heart size normal. Mild bibasilar subsegmental atelectasis fissure scarring. No acute infiltrate. No pleural effusion or pneumothorax. IMPRESSION: Mild bibasilar subsegmental atelectasis and/or scarring. Electronically Signed   By: Marcello Moores  Register   On: 10/02/2015 13:19   Results for orders placed or performed in visit on 10/02/15  POCT Influenza A/B  Result Value Ref Range   Influenza A, POC Negative Negative   Influenza B, POC Negative Negative    Assessment and Plan :  Flu-like symptoms - Plan: POCT Influenza A/B, Guaifenesin (MUCINEX MAXIMUM STRENGTH) 1200 MG TB12, chlorpheniramine-HYDROcodone (TUSSIONEX PENNKINETIC ER) 10-8 MG/5ML SUER  Cough - Plan: DG Chest 2 View   Symptomatic treatment d/w pt along with the above treatments.  Windell Hummingbird PA-C  [facility] 10/02/2015 2:11 PM

## 2015-10-02 NOTE — Progress Notes (Signed)
Subjective:     Patient ID: Peggy Santiago, female   DOB: 1951-01-14, 65 y.o.   MRN: [id number]  HPI The patient is a 65 year old female with HTN, nonsmoker, prediabetes, seasonal allergic rhinitis, DJD, and hx of PUD who presents with onset of non-productive cough, chills, fever, body aches, and fatigue two days ago. Also endorses ear fullness and pressure. Her grandson was diagnosed with influenza 4 days ago and she spend time with him the past weekend.  States her chest and ribs are sore, inability to take a deep breath due to cough, denies overt shortness of breath or chest tightness. Patient has taken Nyquil which helped with sleep and tylenol as needed.  Patient also endorses history of ear discharge and TM perforation with continuous right ear fullness. Ear popping has increased this week since flu like symptoms have began.    Review of Systems  Constitutional: Positive for fever, chills and fatigue.  HENT: Positive for ear pain. Negative for congestion, ear discharge, postnasal drip, rhinorrhea and sinus pressure.   Respiratory: Positive for cough. Negative for chest tightness.   Cardiovascular: Negative for chest pain and palpitations.  Gastrointestinal: Negative for nausea and vomiting.  Musculoskeletal: Positive for myalgias.       Objective: Filed Vitals:   10/02/15 1016  BP: 137/80  Pulse: 107  Temp: 100.4 F (38 C)  Resp: 16     Physical Exam  Constitutional: She appears well-developed.  HENT:  Head: Normocephalic and atraumatic.  Left Ear: External ear normal.  Nose: Nose normal.  Mouth/Throat: Oropharynx is clear and moist. No oropharyngeal exudate.  Scar on inferior portion of TM healing  Eyes: Conjunctivae are normal. Right eye exhibits no discharge. Left eye exhibits no discharge. No scleral icterus.  Neck: Neck supple.  Tenderness of palpation of anterior cervical lymph nodes, mild adenopathy  Cardiovascular: Regular rhythm, normal heart sounds and intact  distal pulses.  Exam reveals no gallop and no friction rub.   No murmur heard. Mildly tachycardic  Pulmonary/Chest: She has no wheezes.  Good air expansion, no wheezes, ronchi or rales in upper lobes. Fine crackles noted on late expiration of left lower lobe. No use of accessory muscles   Results for orders placed or performed in visit on 10/02/15  POCT Influenza A/B  Result Value Ref Range   Influenza A, POC Negative Negative   Influenza B, POC Negative Negative   Dg Chest 2 View  10/02/2015  CLINICAL DATA:  Cough. EXAM: CHEST  2 VIEW COMPARISON:  03/15/2014. FINDINGS: Mediastinum hilar structures normal. Heart size normal. Mild bibasilar subsegmental atelectasis fissure scarring. No acute infiltrate. No pleural effusion or pneumothorax. IMPRESSION: Mild bibasilar subsegmental atelectasis and/or scarring. Electronically Signed   By: Marcello Moores  Register   On: 10/02/2015 13:19      Assessment:     The patient is a 65 year old female with known influenza exposure, symptoms onset within the 48 hour window, due to cough and chest congestion will do nasopharyngeal influenza swab to check for flu and then get CXR to r/o pneumonia. Influenza negative, due to contact will likely treat her anyway. CXR shows no evidence of pneumonia. Will treat for influenza and symptomatically with cough syrup and encourage patient to increase rest and fluids.    Plan:     1. Flu-like symptoms - POCT Influenza A/B - Guaifenesin (MUCINEX MAXIMUM STRENGTH) 1200 MG TB12; Take 1 tablet (1,200 mg total) by mouth 2 (two) times daily.  Dispense: 14 each; Refill:  0 - chlorpheniramine-HYDROcodone (TUSSIONEX PENNKINETIC ER) 10-8 MG/5ML SUER; Take 5 mLs by mouth 2 (two) times daily as needed for cough.  Dispense: 70 mL; Refill: 0  2. Cough - DG Chest 2 View; Future

## 2016-02-25 ENCOUNTER — Telehealth: Payer: Self-pay

## 2016-02-25 NOTE — Telephone Encounter (Signed)
Left message for pt to call back for a follow up appointment with Dr. Lorelei Pont.

## 2016-02-26 NOTE — Telephone Encounter (Signed)
Attempted to reach the pt and advise her that she is due for a follow up with Dr. Lorelei Pont to complete labs and was unable to reach her at this time. I will mail a letter to the pt.

## 2016-03-03 NOTE — Telephone Encounter (Signed)
Called pt but no answer, left message for pt stating that she received a call just as a reminder that she is overdue for some of her lab work. Informed pt that since she has an appointment scheduled for 06/14/16 it is fine to keep her appt in Dec and that she didn't have to come in right now for a separate appointment.

## 2016-03-03 NOTE — Telephone Encounter (Signed)
Pt called in because she received that letter stating that an appt is needed. Pt says that she would like to hold out until Dec to have her CPE. Pt scheduled CPE for 06/14/16 at 8:30. Pt would like to know if this is okay OR do she need to schedule a sooner visit. Pt says that she is fine on her medication and everything is okay.    Please advise.    CB: [phone]

## 2016-06-14 ENCOUNTER — Encounter: Payer: Self-pay | Admitting: Family Medicine

## 2016-06-14 ENCOUNTER — Other Ambulatory Visit: Payer: Self-pay | Admitting: Family Medicine

## 2016-06-14 ENCOUNTER — Ambulatory Visit (INDEPENDENT_AMBULATORY_CARE_PROVIDER_SITE_OTHER): Payer: 59 | Admitting: Family Medicine

## 2016-06-14 VITALS — BP 146/82 | HR 65 | Temp 98.4°F | Ht 62.0 in | Wt 178.2 lb

## 2016-06-14 DIAGNOSIS — Z1231 Encounter for screening mammogram for malignant neoplasm of breast: Secondary | ICD-10-CM

## 2016-06-14 DIAGNOSIS — E785 Hyperlipidemia, unspecified: Secondary | ICD-10-CM

## 2016-06-14 DIAGNOSIS — R030 Elevated blood-pressure reading, without diagnosis of hypertension: Secondary | ICD-10-CM

## 2016-06-14 DIAGNOSIS — Z1329 Encounter for screening for other suspected endocrine disorder: Secondary | ICD-10-CM

## 2016-06-14 DIAGNOSIS — Z130 Encounter for screening for diseases of the blood and blood-forming organs and certain disorders involving the immune mechanism: Secondary | ICD-10-CM

## 2016-06-14 DIAGNOSIS — M1712 Unilateral primary osteoarthritis, left knee: Secondary | ICD-10-CM | POA: Diagnosis not present

## 2016-06-14 DIAGNOSIS — Z23 Encounter for immunization: Secondary | ICD-10-CM | POA: Diagnosis not present

## 2016-06-14 DIAGNOSIS — Z1239 Encounter for other screening for malignant neoplasm of breast: Secondary | ICD-10-CM

## 2016-06-14 DIAGNOSIS — R739 Hyperglycemia, unspecified: Secondary | ICD-10-CM

## 2016-06-14 LAB — LIPID PANEL
Cholesterol: 246 mg/dL — ABNORMAL HIGH (ref 0–200)
HDL: 42 mg/dL (ref >39.00)
LDL CALC: 181 mg/dL — AB (ref 0–99)
NonHDL: 203.51
TRIGLYCERIDES: 115 mg/dL (ref 0.0–149.0)
Total CHOL/HDL Ratio: 6
VLDL: 23 mg/dL (ref 0.0–40.0)

## 2016-06-14 LAB — CBC
HCT: 38 % (ref 36.0–46.0)
Hemoglobin: 12.8 g/dL (ref 12.0–15.0)
MCHC: 33.7 g/dL (ref 30.0–36.0)
MCV: 89.1 fl (ref 78.0–100.0)
PLATELETS: 237 10*3/uL (ref 150.0–400.0)
RBC: 4.26 Mil/uL (ref 3.87–5.11)
RDW: 13.8 % (ref 11.5–15.5)
WBC: 5.3 10*3/uL (ref 4.0–10.5)

## 2016-06-14 LAB — COMPREHENSIVE METABOLIC PANEL
ALK PHOS: 54 U/L (ref 39–117)
ALT: 16 U/L (ref 0–35)
AST: 16 U/L (ref 0–37)
Albumin: 4.2 g/dL (ref 3.5–5.2)
BILIRUBIN TOTAL: 0.4 mg/dL (ref 0.2–1.2)
BUN: 17 mg/dL (ref 6–23)
CHLORIDE: 109 meq/L (ref 96–112)
CO2: 27 mEq/L (ref 19–32)
Calcium: 9.4 mg/dL (ref 8.4–10.5)
Creatinine, Ser: 0.83 mg/dL (ref 0.40–1.20)
GFR: 73.54 mL/min (ref >60.00)
Glucose, Bld: 118 mg/dL — ABNORMAL HIGH (ref 70–99)
Potassium: 4.3 mEq/L (ref 3.5–5.1)
SODIUM: 143 meq/L (ref 135–145)
TOTAL PROTEIN: 7.1 g/dL (ref 6.0–8.3)

## 2016-06-14 LAB — TSH: TSH: 1.1 u[IU]/mL (ref 0.35–4.50)

## 2016-06-14 LAB — HEMOGLOBIN A1C: Hgb A1c MFr Bld: 5.9 % (ref 4.6–6.5)

## 2016-06-14 MED ORDER — CELECOXIB 200 MG PO CAPS: 200.0000 mg | ORAL_CAPSULE | Freq: Every day | ORAL | 3 refills | Status: DC

## 2016-06-14 NOTE — Progress Notes (Addendum)
[facility] [address] [place], [place] [address] [phone]  Date:  06/14/2016   Name:  Peggy Santiago   DOB:  1951/03/02   MRN:  [id number]  PCP:  Lamar Blinks, MD    Chief Complaint: Annual Exam (Last PAP and mammogram: 2014, Colonoscopy: 2008. Due for A1C and urine microalbumin. )   History of Present Illness:  Peggy Santiago is a 65 y.o. very pleasant female patient who presents with the following:  Last seen by myself about 2 years ago for a pre-op visit  She is here today for a pre- op clearance.  She plans to have a right total knee in the next month or so.  She has suffered from pain in her knee for a long time and scope X2 has not been helpful.  She does not have DM, cardiac disease, HTN, or any renal issues.  Her knee has been more painful for the last 5 months so she can not do as much- however prior to this she was working with a Physiological scientist.  She was able to walk/ run on the treadmill for about 30 minutes.  She does not have a history of CP No CP with exercise.  No syncope.    Her A1c was 6.5% at that visit and we dx her with DM However more recently her A1c has been under good control and below diabetic range.  She does not identify as a diabetic She did have her right knee replaced- it has done well overall.  Her left knee is ok Last mammo: 2014 Last pap: 2014, she is s/p hysterectomy.  This was done 25+ years ago.  This was done for a benign cause.   Wt Readings from Last 3 Encounters:  06/14/16 178 lb 3.2 oz (80.8 kg)  10/02/15 177 lb (80.3 kg)  08/28/15 177 lb 9.6 oz (80.6 kg)   She is fasting today for labs.  Her husband Zenia Resides recently was dx with A fib with RVR, they had a stressful weekend.  Thankfully his cardiac cath was negative She would like to get her flu shot today, and would like to catch up on her mammogram asap  Lab Results  Component Value Date   HGBA1C 5.8  03/20/2015     Patient Active Problem List   Diagnosis Date Noted  . Hyperlipidemia 06/13/2015  . Pernicious anemia 06/10/2015  . Allergic rhinitis 06/10/2015  . IBS (irritable bowel syndrome) 06/10/2015  . Nephrolithiasis 06/10/2015  . Migraine 06/10/2015  . Cervical spondylosis without myelopathy 06/10/2015  . DDD (degenerative disc disease), cervical 06/10/2015  . S/P total knee arthroplasty 03/25/2014  . Arthritis of knee, degenerative 02/26/2014  . Type II or unspecified type diabetes mellitus without mention of complication, not stated as uncontrolled 02/23/2014  . Carpal tunnel syndrome 02/02/2013    Past Medical History:  Diagnosis Date  . Allergy   . Anemia   . Chronic kidney disease    kidney stones  . Diabetes mellitus without complication (HCC)    borderline  . Osteoarthritis of knee   . Osteoarthritis of knee   . PONV (postoperative nausea and vomiting)     Past Surgical History:  Procedure Laterality Date  . ABDOMINAL HYSTERECTOMY     Partial; one ovary remains  . ANKLE SURGERY     with plate  . APPENDECTOMY    . CHOLECYSTECTOMY    . colon resection due to SBO    .  EYE SURGERY    . JOINT REPLACEMENT     2 scopes rt knee  . TOTAL KNEE ARTHROPLASTY Right 03/25/2014   Procedure: TOTAL KNEE ARTHROPLASTY;  Surgeon: Vickey Huger, MD;  Location: [facility];  Service: Orthopedics;  Laterality: Right;    Social History  Substance Use Topics  . Smoking status: Never Smoker  . Smokeless tobacco: Never Used  . Alcohol use No    Family History  Problem Relation Age of Onset  . Heart disease Mother   . Rheum arthritis Mother   . Alzheimer's disease Mother   . Heart disease Father   . Diabetes Sister   . Heart disease Sister   . Hyperlipidemia Sister   . Cancer Brother   . Heart disease Brother   . Hyperlipidemia Brother   . Hyperlipidemia Sister   . Heart disease Sister   . Diabetes Sister     Allergies  Allergen Reactions  . Contrast Media  [Iodinated Diagnostic Agents] Hives, Rash and Other (See Comments)    ALL OVER THE BODY    Medication list has been reviewed and updated.  Current Outpatient Prescriptions on File Prior to Visit  Medication Sig Dispense Refill  . celecoxib (CELEBREX) 200 MG capsule Take 1 capsule (200 mg total) by mouth daily. 90 capsule 3  . cetirizine (ZYRTEC) 10 MG tablet Take 10 mg by mouth daily.    . cyanocobalamin (,VITAMIN B-12,) 1000 MCG/ML injection Inject 1 mL (1,000 mcg total) into the muscle every 30 (thirty) days. 10 mL 5  . fluticasone (FLONASE) 50 MCG/ACT nasal spray Place 2 sprays into both nostrils daily. 16 g 1  . Multiple Vitamin (MULTIVITAMIN WITH MINERALS) TABS tablet Take 1 tablet by mouth daily.    . naproxen (NAPROSYN) 500 MG tablet Take 1 tablet (500 mg total) by mouth 2 (two) times daily with a meal. Start this after finishing prednisone. 30 tablet 0  . omeprazole (PRILOSEC) 20 MG capsule Take 1 capsule (20 mg total) by mouth daily. 30 capsule 3  . traMADol (ULTRAM) 50 MG tablet Take by mouth every 6 (six) hours as needed. Reported on 08/28/2015     No current facility-administered medications on file prior to visit.     Review of Systems: No fever, chills, nausea, vomiting, diarrhea,rash Her only real complaint is that her right knee will hurt and feel tight intermittently As per HPI- otherwise negative.   Physical Examination: Vitals:   06/14/16 0840  BP: (!) 146/82  Pulse: 65  Temp: 98.4 F (36.9 C)   Vitals:   06/14/16 0840  Weight: 178 lb 3.2 oz (80.8 kg)  Height: 5\' 2"  (1.575 m)   Body mass index is 32.59 kg/m. Ideal Body Weight: Weight in (lb) to have BMI = 25: 136.4  GEN: WDWN, NAD, Non-toxic, A & O x 3, obese, looks well HEENT: Atraumatic, Normocephalic. Neck supple. No masses, No LAD.  Bilateral TM wnl, oropharynx normal.  PEERL,EOMI.   Ears and Nose: No external deformity. CV: RRR, No M/G/R. No JVD. No thrill. No extra heart sounds. PULM: CTA B, no  wheezes, crackles, rhonchi. No retractions. No resp. distress. No accessory muscle use. ABD: S, NT, ND, +BS. No rebound. No HSM. EXTR: No c/c/e NEURO Normal gait.  PSYCH: Normally interactive. Conversant. Not depressed or anxious appearing.  Calm demeanor.    BP Readings from Last 3 Encounters:  06/14/16 (!) 146/82  10/02/15 137/80  08/28/15 140/80     Assessment and Plan: Dyslipidemia - Plan: Lipid  panel  Primary osteoarthritis of left knee - Plan: celecoxib (CELEBREX) 200 MG capsule  Screening for thyroid disorder - Plan: TSH  Screening for deficiency anemia - Plan: CBC  Hyperglycemia - Plan: Comprehensive metabolic panel, Hemoglobin A1c, CANCELED: Hemoglobin A1c  Immunization due  Screening for breast cancer  Encounter for immunization - Plan: Flu Vaccine QUAD 36+ mos IM  Elevated blood pressure reading   Here today for a follow-up visit Flu shot Encouraged a mammogram asap Labs pending as above Refilled celebrex for her knee pain BP is high today- she has been under a lot of stress.  Will continue to monitor   Signed Lamar Blinks, MD  Results for orders placed or performed in visit on 06/14/16  CBC  Result Value Ref Range   WBC 5.3 4.0 - 10.5 K/uL   RBC 4.26 3.87 - 5.11 Mil/uL   Platelets 237.0 150.0 - 400.0 K/uL   Hemoglobin 12.8 12.0 - 15.0 g/dL   HCT 38.0 36.0 - 46.0 %   MCV 89.1 78.0 - 100.0 fl   MCHC 33.7 30.0 - 36.0 g/dL   RDW 13.8 11.5 - 15.5 %  Comprehensive metabolic panel  Result Value Ref Range   Sodium 143 135 - 145 mEq/L   Potassium 4.3 3.5 - 5.1 mEq/L   Chloride 109 96 - 112 mEq/L   CO2 27 19 - 32 mEq/L   Glucose, Bld 118 (H) 70 - 99 mg/dL   BUN 17 6 - 23 mg/dL   Creatinine, Ser 0.83 0.40 - 1.20 mg/dL   Total Bilirubin 0.4 0.2 - 1.2 mg/dL   Alkaline Phosphatase 54 39 - 117 U/L   AST 16 0 - 37 U/L   ALT 16 0 - 35 U/L   Total Protein 7.1 6.0 - 8.3 g/dL   Albumin 4.2 3.5 - 5.2 g/dL   Calcium 9.4 8.4 - 10.5 mg/dL   GFR 73.54  >60.00 mL/min  Lipid panel  Result Value Ref Range   Cholesterol 246 (H) 0 - 200 mg/dL   Triglycerides 115.0 0.0 - 149.0 mg/dL   HDL 42.00 >39.00 mg/dL   VLDL 23.0 0.0 - 40.0 mg/dL   LDL Cholesterol 181 (H) 0 - 99 mg/dL   Total CHOL/HDL Ratio 6    NonHDL 203.51   TSH  Result Value Ref Range   TSH 1.10 0.35 - 4.50 uIU/mL  Hemoglobin A1c  Result Value Ref Range   Hgb A1c MFr Bld 5.9 4.6 - 6.5 %   A1c is still in the pre-diabetes range.  She is not on a cholesterol med. 10 year CV risk is approx 17%, 30% if she is considered diabetic.  Called her but [facility]- would recommend a cholesterol med.  Will rx to her drug store - assuming she is ok with this start taking it and we can repeat her chl in 6 months

## 2016-06-14 NOTE — Patient Instructions (Addendum)
It was nice to see you today- give my best to [place]!   We will check your labs today and I will be in touch with your results Do get your eyes examined about once a year You can have your mammogram done right here if you like- the imaging dept is on the ground floor.  [place]

## 2016-06-14 NOTE — Progress Notes (Signed)
Pre visit review using our clinic review tool, if applicable. No additional management support is needed unless otherwise documented below in the visit note. 

## 2016-06-15 ENCOUNTER — Ambulatory Visit (HOSPITAL_BASED_OUTPATIENT_CLINIC_OR_DEPARTMENT_OTHER)
Admission: RE | Admit: 2016-06-15 | Discharge: 2016-06-15 | Disposition: A | Discharge status: Home / Self Care | Payer: 59 | Source: Ambulatory Visit | Attending: Family Medicine | Admitting: Family Medicine

## 2016-06-15 ENCOUNTER — Encounter: Payer: Self-pay | Admitting: Family Medicine

## 2016-06-15 DIAGNOSIS — Z1231 Encounter for screening mammogram for malignant neoplasm of breast: Secondary | ICD-10-CM | POA: Diagnosis present

## 2016-06-15 DIAGNOSIS — E785 Hyperlipidemia, unspecified: Secondary | ICD-10-CM | POA: Insufficient documentation

## 2016-06-15 MED ORDER — SIMVASTATIN 20 MG PO TABS: 20.0000 mg | ORAL_TABLET | Freq: Every day | ORAL | 3 refills | Status: DC

## 2016-06-15 NOTE — Addendum Note (Signed)
Addended by: Lamar Blinks C on: 06/15/2016 04:16 PM   Modules accepted: Orders

## 2016-10-27 ENCOUNTER — Ambulatory Visit (INDEPENDENT_AMBULATORY_CARE_PROVIDER_SITE_OTHER): Payer: 59 | Admitting: Family Medicine

## 2016-10-27 VITALS — BP 98/68 | HR 63 | Temp 98.5°F | Resp 16 | Ht 62.0 in | Wt 175.2 lb

## 2016-10-27 DIAGNOSIS — A084 Viral intestinal infection, unspecified: Secondary | ICD-10-CM

## 2016-10-27 MED ORDER — ONDANSETRON HCL 4 MG PO TABS: 4.0000 mg | ORAL_TABLET | Freq: Three times a day (TID) | ORAL | 0 refills | Status: DC | PRN

## 2016-10-27 NOTE — Patient Instructions (Signed)
If things worsen, fevers, or you are unable to keep anything down, seek immediate care.   Pedialyte, Gatorade, Powerade, protein shakes to help rehydrate.   Try to eat as much as you can. Avoid aggravating foods.

## 2016-10-27 NOTE — Progress Notes (Signed)
Chief Complaint  Patient presents with  . Acute Visit    pt c/o vomiting, diarrhea since Sunday night, along with a rash on forhead and face, headache, weakness, and chills.     Subjective Peggy Santiago is a 66 y.o. female who presents with vomiting and diarrhea Symptoms began 3 days ago. Patient has cramping, vomiting, diarrhea and chills Patient denies fever, sweats, myalgias, recent travel, recent abx use and cough Evaluation to date: no Sick contacts: none known  Past Medical History:  Diagnosis Date  . Allergy   . Anemia   . Chronic kidney disease    kidney stones  . Diabetes mellitus without complication (HCC)    borderline  . Osteoarthritis of knee   . Osteoarthritis of knee   . PONV (postoperative nausea and vomiting)    Past Surgical History:  Procedure Laterality Date  . ABDOMINAL HYSTERECTOMY     Partial; one ovary remains  . ANKLE SURGERY     with plate  . APPENDECTOMY    . CHOLECYSTECTOMY    . colon resection due to SBO    . EYE SURGERY    . JOINT REPLACEMENT     2 scopes rt knee  . TOTAL KNEE ARTHROPLASTY Right 03/25/2014   Procedure: TOTAL KNEE ARTHROPLASTY;  Surgeon: Vickey Huger, MD;  Location: [facility];  Service: Orthopedics;  Laterality: Right;   Current Outpatient Prescriptions on File Prior to Visit  Medication Sig Dispense Refill  . celecoxib (CELEBREX) 200 MG capsule Take 1 capsule (200 mg total) by mouth daily. 90 capsule 3  . cetirizine (ZYRTEC) 10 MG tablet Take 10 mg by mouth daily.    . cyanocobalamin (,VITAMIN B-12,) 1000 MCG/ML injection Inject 1 mL (1,000 mcg total) into the muscle every 30 (thirty) days. 10 mL 5  . fluticasone (FLONASE) 50 MCG/ACT nasal spray Place 2 sprays into both nostrils daily. 16 g 1  . Multiple Vitamin (MULTIVITAMIN WITH MINERALS) TABS tablet Take 1 tablet by mouth daily.    Marland Kitchen omeprazole (PRILOSEC) 20 MG capsule Take 1 capsule (20 mg total) by mouth daily. 30 capsule 3  . simvastatin (ZOCOR) 20 MG tablet Take 1  tablet (20 mg total) by mouth at bedtime. 90 tablet 3   Allergies  Allergen Reactions  . Contrast Media [Iodinated Diagnostic Agents] Hives, Rash and Other (See Comments)    ALL OVER THE BODY    Review of Systems Constitutional:  No fevers or chills Ear/Nose/Mouth/Throat:  No red eyes Gastrointestinal:  As noted in the HPI Musculoskeletal/Extremities: no myalgias Integumentary (Skin/Breast): no rash  Exam BP 98/68 (BP Location: Right Arm, Patient Position: Sitting, Cuff Size: Normal)   Pulse 63   Temp 98.5 F (36.9 C) (Oral)   Resp 16   Ht 5\' 2"  (1.575 m)   Wt 175 lb 3.2 oz (79.5 kg)   SpO2 97%   BMI 32.04 kg/m  General:  well developed, well hydrated, in no apparent distress Skin:  warm, no pallor or diaphoresis, no rashes Throat/Pharynx:  lips and gingiva without lesion; tongue and uvula midline; non-inflamed pharynx; no exudates or postnasal drainage Neck: neck supple without adenopathy, thyromegaly, or masses Lungs:  clear to auscultation, breath sounds equal bilaterally, no respiratory distress, no wheezes Cardio:  regular rate and rhythm without murmurs Abdomen:  abdomen soft, diffuse TTP, no focal areas of extreme tenderness; bowel sounds normal; no masses or organomegaly Extremities:  no clubbing, cyanosis, or edema Psych: Appropriate judgement/insight  Assessment and Plan  Viral gastroenteritis -  Plan: ondansetron (ZOFRAN) 4 MG tablet  Orders as above. Sounds like she is on the tail end of her illness. Will hold off on stool cultures as this is normalizing. Main issue is poor PO intake. Avoid aggravating foods, discussed BRAT diet. F/u if symptoms fail to improve, sooner or immediate care if worsening. She may need IV rehydration if this is the case. The patient voiced understanding and agreement to the plan.  Cabin John, DO 10/27/16  10:37 AM

## 2016-10-27 NOTE — Progress Notes (Signed)
Pre visit review using our clinic review tool, if applicable. No additional management support is needed unless otherwise documented below in the visit note. 

## 2016-11-29 ENCOUNTER — Other Ambulatory Visit: Payer: Self-pay | Admitting: Physician Assistant

## 2016-11-29 DIAGNOSIS — D510 Vitamin B12 deficiency anemia due to intrinsic factor deficiency: Secondary | ICD-10-CM

## 2017-02-22 NOTE — Progress Notes (Addendum)
[facility] [place], [place], [place] [address] [phone]  Date:  02/23/2017   Name:  Peggy Santiago   DOB:  [birth year]   MRN:  [id number]  PCP:  Darreld Mclean, MD    Chief Complaint: Leg Pain (Both legs progressing n the las 2-3 months)   History of Present Illness:  Peggy Santiago is a 66 y.o. very pleasant female patient who presents with the following:  History of hyperlipidemia,diet controlled DM- here today with an acute concern She is also due for an eye exam, foot exam, and urine micro  Last seen by myself in December-  Her A1c was 6.5% at that visit and we dx her with DM However more recently her A1c has been under good control and below diabetic range.  She does not identify as a diabetic She did have her right knee replaced- it has done well overall.  Her left knee is ok Last mammo: 2014 Last pap: 2014, she is s/p hysterectomy.  This was done 25+ years ago.  This was done for a benign cause.   She notes that both of her legs "just ache like a toothache," right more than left but both are painful She has noted this for 3-4 months, getting worse.  Walking does not make it worse- actually it seems worse when she is at rest, the worst at night when she may have some leg cramps.   She is trying to hydrate, and taking OTC meds for arthritis She is on zocor- has been taking this for about 4 months  She is using a topical icy hot type preparation She has had a right total knee- it does pretty well, she has "good days and bad days." No change in her routine.  Exercise does make her leg worse She will feel like she needs to move her legs and cannot get them to a comfortable position  Her back is not painful  Lab Results  Component Value Date   HGBA1C 5.9 06/14/2016     Patient Active Problem List   Diagnosis Date Noted  . Dyslipidemia 06/15/2016  . Hyperlipidemia 06/13/2015  .  Pernicious anemia 06/10/2015  . Allergic rhinitis 06/10/2015  . IBS (irritable bowel syndrome) 06/10/2015  . Nephrolithiasis 06/10/2015  . Migraine 06/10/2015  . Cervical spondylosis without myelopathy 06/10/2015  . DDD (degenerative disc disease), cervical 06/10/2015  . S/P total knee arthroplasty 03/25/2014  . Arthritis of knee, degenerative 02/26/2014  . Type II or unspecified type diabetes mellitus without mention of complication, not stated as uncontrolled 02/23/2014  . Carpal tunnel syndrome 02/02/2013    Past Medical History:  Diagnosis Date  . Allergy   . Anemia   . Chronic kidney disease    kidney stones  . Diabetes mellitus without complication (HCC)    borderline  . Osteoarthritis of knee   . Osteoarthritis of knee   . PONV (postoperative nausea and vomiting)     Past Surgical History:  Procedure Laterality Date  . ABDOMINAL HYSTERECTOMY     Partial; one ovary remains  . ANKLE SURGERY     with plate  . APPENDECTOMY    . CHOLECYSTECTOMY    . colon resection due to SBO    . EYE SURGERY    . JOINT REPLACEMENT     2 scopes rt knee  . TOTAL KNEE ARTHROPLASTY Right 03/25/2014   Procedure: TOTAL KNEE ARTHROPLASTY;  Surgeon:  Vickey Huger, MD;  Location: [facility];  Service: Orthopedics;  Laterality: Right;    Social History  Substance Use Topics  . Smoking status: Never Smoker  . Smokeless tobacco: Never Used  . Alcohol use No    Family History  Problem Relation Age of Onset  . Heart disease Mother   . Rheum arthritis Mother   . Alzheimer's disease Mother   . Heart disease Father   . Diabetes Sister   . Heart disease Sister   . Hyperlipidemia Sister   . Cancer Brother   . Heart disease Brother   . Hyperlipidemia Brother   . Hyperlipidemia Sister   . Heart disease Sister   . Diabetes Sister     Allergies  Allergen Reactions  . Contrast Media [Iodinated Diagnostic Agents] Hives, Rash and Other (See Comments)    ALL OVER THE BODY    Medication list  has been reviewed and updated.  Current Outpatient Prescriptions on File Prior to Visit  Medication Sig Dispense Refill  . celecoxib (CELEBREX) 200 MG capsule Take 1 capsule (200 mg total) by mouth daily. 90 capsule 3  . cetirizine (ZYRTEC) 10 MG tablet Take 10 mg by mouth daily.    . cyanocobalamin (,VITAMIN B-12,) 1000 MCG/ML injection INJECT 1 ML (1,000 MCG TOTAL) INTO THE MUSCLE EVERY 30 (THIRTY) DAYS 3 mL 12  . Multiple Vitamin (MULTIVITAMIN WITH MINERALS) TABS tablet Take 1 tablet by mouth daily.    Marland Kitchen omeprazole (PRILOSEC) 20 MG capsule Take 1 capsule (20 mg total) by mouth daily. 30 capsule 3  . simvastatin (ZOCOR) 20 MG tablet Take 1 tablet (20 mg total) by mouth at bedtime. 90 tablet 3  . fluticasone (FLONASE) 50 MCG/ACT nasal spray Place 2 sprays into both nostrils daily. (Patient not taking: Reported on 02/23/2017) 16 g 1  . ondansetron (ZOFRAN) 4 MG tablet Take 1 tablet (4 mg total) by mouth every 8 (eight) hours as needed for nausea or vomiting. (Patient not taking: Reported on 02/23/2017) 20 tablet 0   No current facility-administered medications on file prior to visit.     Review of Systems:  As per HPI- otherwise negative.  No fever or chills No CP or SOB No nausea, vomiting or diarrhea    Physical Examination: Vitals:   02/23/17 1538  BP: 134/68  Pulse: 96  Temp: 98.6 F (37 C)  SpO2: 99%   Vitals:   02/23/17 1538  Weight: 182 lb 6.4 oz (82.7 kg)  Height: 5\' 2"  (1.575 m)   Body mass index is 33.36 kg/m. Ideal Body Weight: Weight in (lb) to have BMI = 25: 136.4  GEN: WDWN, NAD, Non-toxic, A & O x 3 HEENT: Atraumatic, Normocephalic. Neck supple. No masses, No LAD. Ears and Nose: No external deformity. CV: RRR, No M/G/R. No JVD. No thrill. No extra heart sounds. PULM: CTA B, no wheezes, crackles, rhonchi. No retractions. No resp. distress. No accessory muscle use. EXTR: No c/c/e NEURO Normal gait.  PSYCH: Normally interactive. Conversant. Not depressed or  anxious appearing.  Calm demeanor.  Her legs are not swollen, red, or hot. She notes mild TTP in both lower legs- calves and shins    Assessment and Plan: Controlled type 2 diabetes mellitus without complication, without long-term current use of insulin (HCC) - Plan: Microalbumin / creatinine urine ratio, Hemoglobin A1c, Comprehensive metabolic panel  Dyslipidemia  Pernicious anemia - Plan: B12  Bilateral leg pain - Plan: CK (Creatine Kinase), Ferritin, B12, Comprehensive metabolic panel, cyclobenzaprine (FLEXERIL) 10  MG tablet here today with bilateral lower leg pain- not worse with exercise- for the last few months  Please stop taking your simvastatin (cholesterol med) for 1-2 weeks; this will help [place] determine if this is the cause of your leg pain In the meantime we are going to get some labs for you- I will be in touch with your results asap I also gave you a muscle relaxer to take for pain and to help you sleep at night.  Use this as needed, but do not use if you are driving   Signed Lamar Blinks, MD  Received her labs 8/17 Results for orders placed or performed in visit on 02/23/17  Microalbumin / creatinine urine ratio  Result Value Ref Range   Microalb, Ur <0.7 0.0 - 1.9 mg/dL   Creatinine,U 137.5 mg/dL   Microalb Creat Ratio 0.5 0.0 - 30.0 mg/g  Hemoglobin A1c  Result Value Ref Range   Hgb A1c MFr Bld 5.9 4.6 - 6.5 %  CK (Creatine Kinase)  Result Value Ref Range   Total CK 119 7 - 177 U/L  Ferritin  Result Value Ref Range   Ferritin 58.0 10.0 - 291.0 ng/mL  B12  Result Value Ref Range   Vitamin B-12 193 (L) 211 - 911 pg/mL  Comprehensive metabolic panel  Result Value Ref Range   Sodium 141 135 - 145 mEq/L   Potassium 3.6 3.5 - 5.1 mEq/L   Chloride 106 96 - 112 mEq/L   CO2 27 19 - 32 mEq/L   Glucose, Bld 126 (H) 70 - 99 mg/dL   BUN 14 6 - 23 mg/dL   Creatinine, Ser 0.77 0.40 - 1.20 mg/dL   Total Bilirubin 0.4 0.2 - 1.2 mg/dL   Alkaline Phosphatase 52 39 -  117 U/L   AST 17 0 - 37 U/L   ALT 17 0 - 35 U/L   Total Protein 6.6 6.0 - 8.3 g/dL   Albumin 4.0 3.5 - 5.2 g/dL   Calcium 9.6 8.4 - 10.5 mg/dL   GFR 80.02 >60.00 mL/min

## 2017-02-23 ENCOUNTER — Encounter: Payer: Self-pay | Admitting: Family Medicine

## 2017-02-23 ENCOUNTER — Ambulatory Visit (INDEPENDENT_AMBULATORY_CARE_PROVIDER_SITE_OTHER): Payer: 59 | Admitting: Family Medicine

## 2017-02-23 VITALS — BP 134/68 | HR 96 | Temp 98.6°F | Ht 62.0 in | Wt 182.4 lb

## 2017-02-23 DIAGNOSIS — E119 Type 2 diabetes mellitus without complications: Secondary | ICD-10-CM

## 2017-02-23 DIAGNOSIS — M79604 Pain in right leg: Secondary | ICD-10-CM

## 2017-02-23 DIAGNOSIS — D510 Vitamin B12 deficiency anemia due to intrinsic factor deficiency: Secondary | ICD-10-CM

## 2017-02-23 DIAGNOSIS — M79605 Pain in left leg: Secondary | ICD-10-CM | POA: Diagnosis not present

## 2017-02-23 DIAGNOSIS — E785 Hyperlipidemia, unspecified: Secondary | ICD-10-CM

## 2017-02-23 MED ORDER — CYCLOBENZAPRINE HCL 10 MG PO TABS: 10.0000 mg | ORAL_TABLET | Freq: Two times a day (BID) | ORAL | 0 refills | Status: DC | PRN

## 2017-02-23 NOTE — Patient Instructions (Signed)
Please stop taking your simvastatin (cholesterol med) for 1-2 weeks; this will help [place] determine if this is the cause of your leg pain In the meantime we are going to get some labs for you- I will be in touch with your results asap I also gave you a muscle relaxer to take for pain and to help you sleep at night.  Use this as needed, but do not use if you are driving

## 2017-02-24 LAB — FERRITIN: FERRITIN: 58 ng/mL (ref 10.0–291.0)

## 2017-02-24 LAB — HEMOGLOBIN A1C: Hgb A1c MFr Bld: 5.9 % (ref 4.6–6.5)

## 2017-02-24 LAB — MICROALBUMIN / CREATININE URINE RATIO
CREATININE, U: 137.5 mg/dL
MICROALB/CREAT RATIO: 0.5 mg/g (ref 0.0–30.0)
Microalb, Ur: <0.7 mg/dL (ref 0.0–1.9)

## 2017-02-24 LAB — COMPREHENSIVE METABOLIC PANEL
ALT: 17 U/L (ref 0–35)
AST: 17 U/L (ref 0–37)
Albumin: 4 g/dL (ref 3.5–5.2)
Alkaline Phosphatase: 52 U/L (ref 39–117)
BILIRUBIN TOTAL: 0.4 mg/dL (ref 0.2–1.2)
BUN: 14 mg/dL (ref 6–23)
CALCIUM: 9.6 mg/dL (ref 8.4–10.5)
CHLORIDE: 106 meq/L (ref 96–112)
CO2: 27 meq/L (ref 19–32)
Creatinine, Ser: 0.77 mg/dL (ref 0.40–1.20)
GFR: 80.02 mL/min (ref >60.00)
Glucose, Bld: 126 mg/dL — ABNORMAL HIGH (ref 70–99)
Potassium: 3.6 mEq/L (ref 3.5–5.1)
Sodium: 141 mEq/L (ref 135–145)
Total Protein: 6.6 g/dL (ref 6.0–8.3)

## 2017-02-24 LAB — VITAMIN B12: Vitamin B-12: 193 pg/mL — ABNORMAL LOW (ref 211–911)

## 2017-02-24 LAB — CK: CK TOTAL: 119 U/L (ref 7–177)

## 2017-02-25 ENCOUNTER — Encounter: Payer: Self-pay | Admitting: Family Medicine

## 2017-03-03 ENCOUNTER — Encounter: Payer: Self-pay | Admitting: Family Medicine

## 2017-05-10 ENCOUNTER — Other Ambulatory Visit: Payer: Self-pay | Admitting: Family Medicine

## 2017-05-10 DIAGNOSIS — Z1231 Encounter for screening mammogram for malignant neoplasm of breast: Secondary | ICD-10-CM

## 2017-06-17 ENCOUNTER — Ambulatory Visit (HOSPITAL_BASED_OUTPATIENT_CLINIC_OR_DEPARTMENT_OTHER)
Admission: RE | Admit: 2017-06-17 | Discharge: 2017-06-17 | Disposition: A | Discharge status: Home / Self Care | Payer: 59 | Source: Ambulatory Visit | Attending: Family Medicine | Admitting: Family Medicine

## 2017-06-17 DIAGNOSIS — Z1231 Encounter for screening mammogram for malignant neoplasm of breast: Secondary | ICD-10-CM | POA: Insufficient documentation

## 2017-06-20 ENCOUNTER — Other Ambulatory Visit: Payer: Self-pay | Admitting: Family Medicine

## 2017-06-20 DIAGNOSIS — M1712 Unilateral primary osteoarthritis, left knee: Secondary | ICD-10-CM

## 2017-06-21 NOTE — Progress Notes (Addendum)
[facility] [address], [place], [place] [address] [phone]  Date:  06/23/2017   Name:  Peggy Santiago   DOB:  [birth year]   MRN:  [id number]  PCP:  Darreld Mclean, MD    Chief Complaint: Annual Exam   History of Present Illness:  Peggy Santiago is a 66 y.o. very pleasant female patient who presents with the following:  Here today for a CPE History of hyperlipidemia, pernicious anemia, kidney stones, DDD (neck), DM Last visit here in August for follow-up  However more recently her A1c has been under good control and below diabetic range. She does not identify as a diabetic She did have her right knee replaced- it has done well overall. Her left knee is ok Last mammo: 2014 Last pap: 2014, she is s/p hysterectomy. This was done 25+ years ago. This was done for a benign cause  Lab Results  Component Value Date   HGBA1C 5.9 02/23/2017   Labs: complete done a year ago Pneumonia vaccine: will do today Colon cancer screening: Flu: done at [facility] in October mammo last week, 12/7- normal   She is fasting today for labs  Do B12 level for her today She does see Dr. Earlean Shawl for her GI care She will ask him about the Cologuard test, as she would like to have this done instead of colonoscopy if possible  She has been doing well since August- she did come off her cholesterol medication due to aches and pains in her legs. We have tried statins a few times but she has not been able to tolerate.   Patient Active Problem List   Diagnosis Date Noted  . Dyslipidemia 06/15/2016  . Hyperlipidemia 06/13/2015  . Pernicious anemia 06/10/2015  . Allergic rhinitis 06/10/2015  . IBS (irritable bowel syndrome) 06/10/2015  . Nephrolithiasis 06/10/2015  . Migraine 06/10/2015  . Cervical spondylosis without myelopathy 06/10/2015  . DDD (degenerative disc disease), cervical 06/10/2015  . S/P total knee arthroplasty  03/25/2014  . Arthritis of knee, degenerative 02/26/2014  . Type II or unspecified type diabetes mellitus without mention of complication, not stated as uncontrolled 02/23/2014  . Carpal tunnel syndrome 02/02/2013    Past Medical History:  Diagnosis Date  . Allergy   . Anemia   . Chronic kidney disease    kidney stones  . Diabetes mellitus without complication (HCC)    borderline  . Osteoarthritis of knee   . Osteoarthritis of knee   . PONV (postoperative nausea and vomiting)     Past Surgical History:  Procedure Laterality Date  . ABDOMINAL HYSTERECTOMY     Partial; one ovary remains  . ANKLE SURGERY     with plate  . APPENDECTOMY    . CHOLECYSTECTOMY    . colon resection due to SBO    . EYE SURGERY    . JOINT REPLACEMENT     2 scopes rt knee  . TOTAL KNEE ARTHROPLASTY Right 03/25/2014   Procedure: TOTAL KNEE ARTHROPLASTY;  Surgeon: Vickey Huger, MD;  Location: [facility];  Service: Orthopedics;  Laterality: Right;    Social History   Tobacco Use  . Smoking status: Never Smoker  . Smokeless tobacco: Never Used  Substance Use Topics  . Alcohol use: No    Alcohol/week: 0.0 oz  . Drug use: No    Family History  Problem Relation Age of Onset  . Heart disease Mother   .  Rheum arthritis Mother   . Alzheimer's disease Mother   . Heart disease Father   . Diabetes Sister   . Heart disease Sister   . Hyperlipidemia Sister   . Cancer Brother   . Heart disease Brother   . Hyperlipidemia Brother   . Hyperlipidemia Sister   . Heart disease Sister   . Diabetes Sister     Allergies  Allergen Reactions  . Contrast Media [Iodinated Diagnostic Agents] Hives, Rash and Other (See Comments)    ALL OVER THE BODY    Medication list has been reviewed and updated.  Current Outpatient Medications on File Prior to Visit  Medication Sig Dispense Refill  . celecoxib (CELEBREX) 200 MG capsule Take 1 capsule (200 mg total) by mouth daily. 90 capsule 3  . cetirizine (ZYRTEC) 10 MG  tablet Take 10 mg by mouth daily.    . cyanocobalamin (,VITAMIN B-12,) 1000 MCG/ML injection INJECT 1 ML (1,000 MCG TOTAL) INTO THE MUSCLE EVERY 30 (THIRTY) DAYS 3 mL 12  . cyclobenzaprine (FLEXERIL) 10 MG tablet Take 1 tablet (10 mg total) by mouth 2 (two) times daily as needed for muscle spasms. 30 tablet 0  . fluticasone (FLONASE) 50 MCG/ACT nasal spray Place 2 sprays into both nostrils daily. (Patient not taking: Reported on 02/23/2017) 16 g 1  . Multiple Vitamin (MULTIVITAMIN WITH MINERALS) TABS tablet Take 1 tablet by mouth daily.    Marland Kitchen omeprazole (PRILOSEC) 20 MG capsule Take 1 capsule (20 mg total) by mouth daily. 30 capsule 3  . ondansetron (ZOFRAN) 4 MG tablet Take 1 tablet (4 mg total) by mouth every 8 (eight) hours as needed for nausea or vomiting. (Patient not taking: Reported on 02/23/2017) 20 tablet 0  . simvastatin (ZOCOR) 20 MG tablet Take 1 tablet (20 mg total) by mouth at bedtime. 90 tablet 3   No current facility-administered medications on file prior to visit.     Review of Systems:  As per HPI- otherwise negative. No Cp or SOB No fever or chills Her exercise is somewhat limited by her right knee pain She does have very large breasts, but as she does not run any longer they do not restrict her activities   Physical Examination: Vitals:   06/23/17 0836  BP: (!) 141/64  Pulse: 65  Resp: 18  Temp: 98.5 F (36.9 C)  SpO2: 98%   Vitals:   06/23/17 0836  Weight: 184 lb 3.2 oz (83.6 kg)  Height: 5\' 1"  (1.549 m)   Body mass index is 34.8 kg/m. Ideal Body Weight: Weight in (lb) to have BMI = 25: 132  GEN: WDWN, NAD, Non-toxic, A & O x 3, overweight, looks well HEENT: Atraumatic, Normocephalic. Neck supple. No masses, No LAD.  Bilateral TM wnl, oropharynx normal.  PEERL,EOMI.   Ears and Nose: No external deformity. CV: RRR, No M/G/R. No JVD. No thrill. No extra heart sounds. PULM: CTA B, no wheezes, crackles, rhonchi. No retractions. No resp. distress. No accessory  muscle use. ABD: S, NT, ND, +BS. No rebound. No HSM. EXTR: No c/c/e NEURO Normal gait.  PSYCH: Normally interactive. Conversant. Not depressed or anxious appearing.  Calm demeanor.   BP Readings from Last 3 Encounters:  06/23/17 (!) 141/64  02/23/17 134/68  10/27/16 98/68    Assessment and Plan: Physical exam  Estrogen deficiency - Plan: DG Bone Density  Dyslipidemia - Plan: Lipid panel  Controlled type 2 diabetes mellitus without complication, without long-term current use of insulin ([place]) - Plan: Comprehensive metabolic  panel, Hemoglobin A1c  Pernicious anemia - Plan: B12  Immunization due - Plan: Pneumococcal conjugate vaccine 13-valent IM  Medication monitoring encounter - Plan: CBC here today for a CPE Doing well overall Her husband just had spine surgery, I am seeing him next week  Ordered labs, bone density prevnar today Discussed shingrix- she had zostavax 4 years ago, may do next year  Will plan further follow- up pending labs.  Signed Lamar Blinks, MD  Received her labs 12/15  Your blood count is normal Metabolic profile looks good Your A1c is in the pre-diabetes range. We will continue to monitor this B12 is ok Your cholesterol is not as good as I would like.  I was thinking of prescribing Vascepa for you- this is an rx Omega 3.  Would you be ok with trying this?  I can rx it for you to try Results for orders placed or performed in visit on 06/23/17  CBC  Result Value Ref Range   WBC 4.9 4.0 - 10.5 K/uL   RBC 4.23 3.87 - 5.11 Mil/uL   Platelets 231.0 150.0 - 400.0 K/uL   Hemoglobin 12.9 12.0 - 15.0 g/dL   HCT 38.7 36.0 - 46.0 %   MCV 91.6 78.0 - 100.0 fl   MCHC 33.2 30.0 - 36.0 g/dL   RDW 13.5 11.5 - 15.5 %  Comprehensive metabolic panel  Result Value Ref Range   Sodium 142 135 - 145 mEq/L   Potassium 4.3 3.5 - 5.1 mEq/L   Chloride 108 96 - 112 mEq/L   CO2 28 19 - 32 mEq/L   Glucose, Bld 108 (H) 70 - 99 mg/dL   BUN 23 6 - 23 mg/dL    Creatinine, Ser 0.83 0.40 - 1.20 mg/dL   Total Bilirubin 0.4 0.2 - 1.2 mg/dL   Alkaline Phosphatase 55 39 - 117 U/L   AST 14 0 - 37 U/L   ALT 18 0 - 35 U/L   Total Protein 7.0 6.0 - 8.3 g/dL   Albumin 4.1 3.5 - 5.2 g/dL   Calcium 9.3 8.4 - 10.5 mg/dL   GFR 73.30 >60.00 mL/min  Hemoglobin A1c  Result Value Ref Range   Hgb A1c MFr Bld 6.2 4.6 - 6.5 %  Lipid panel  Result Value Ref Range   Cholesterol 244 (H) 0 - 200 mg/dL   Triglycerides 187.0 (H) 0.0 - 149.0 mg/dL   HDL 38.80 (L) >39.00 mg/dL   VLDL 37.4 0.0 - 40.0 mg/dL   LDL Cholesterol 167 (H) 0 - 99 mg/dL   Total CHOL/HDL Ratio 6    NonHDL 204.80   B12  Result Value Ref Range   Vitamin B-12 255 211 - 911 pg/mL

## 2017-06-23 ENCOUNTER — Encounter: Payer: Self-pay | Admitting: Family Medicine

## 2017-06-23 ENCOUNTER — Ambulatory Visit (INDEPENDENT_AMBULATORY_CARE_PROVIDER_SITE_OTHER): Payer: 59 | Admitting: Family Medicine

## 2017-06-23 VITALS — BP 130/70 | HR 65 | Temp 98.5°F | Resp 18 | Ht 61.0 in | Wt 184.2 lb

## 2017-06-23 DIAGNOSIS — Z23 Encounter for immunization: Secondary | ICD-10-CM | POA: Diagnosis not present

## 2017-06-23 DIAGNOSIS — Z5181 Encounter for therapeutic drug level monitoring: Secondary | ICD-10-CM | POA: Diagnosis not present

## 2017-06-23 DIAGNOSIS — Z0000 Encounter for general adult medical examination without abnormal findings: Secondary | ICD-10-CM

## 2017-06-23 DIAGNOSIS — E119 Type 2 diabetes mellitus without complications: Secondary | ICD-10-CM

## 2017-06-23 DIAGNOSIS — D510 Vitamin B12 deficiency anemia due to intrinsic factor deficiency: Secondary | ICD-10-CM | POA: Diagnosis not present

## 2017-06-23 DIAGNOSIS — E2839 Other primary ovarian failure: Secondary | ICD-10-CM | POA: Diagnosis not present

## 2017-06-23 DIAGNOSIS — E785 Hyperlipidemia, unspecified: Secondary | ICD-10-CM | POA: Diagnosis not present

## 2017-06-23 LAB — CBC
HCT: 38.7 % (ref 36.0–46.0)
HEMOGLOBIN: 12.9 g/dL (ref 12.0–15.0)
MCHC: 33.2 g/dL (ref 30.0–36.0)
MCV: 91.6 fl (ref 78.0–100.0)
PLATELETS: 231 10*3/uL (ref 150.0–400.0)
RBC: 4.23 Mil/uL (ref 3.87–5.11)
RDW: 13.5 % (ref 11.5–15.5)
WBC: 4.9 10*3/uL (ref 4.0–10.5)

## 2017-06-23 LAB — COMPREHENSIVE METABOLIC PANEL
ALBUMIN: 4.1 g/dL (ref 3.5–5.2)
ALT: 18 U/L (ref 0–35)
AST: 14 U/L (ref 0–37)
Alkaline Phosphatase: 55 U/L (ref 39–117)
BILIRUBIN TOTAL: 0.4 mg/dL (ref 0.2–1.2)
BUN: 23 mg/dL (ref 6–23)
CALCIUM: 9.3 mg/dL (ref 8.4–10.5)
CO2: 28 meq/L (ref 19–32)
CREATININE: 0.83 mg/dL (ref 0.40–1.20)
Chloride: 108 mEq/L (ref 96–112)
GFR: 73.3 mL/min (ref >60.00)
Glucose, Bld: 108 mg/dL — ABNORMAL HIGH (ref 70–99)
Potassium: 4.3 mEq/L (ref 3.5–5.1)
Sodium: 142 mEq/L (ref 135–145)
Total Protein: 7 g/dL (ref 6.0–8.3)

## 2017-06-23 LAB — HEMOGLOBIN A1C: HEMOGLOBIN A1C: 6.2 % (ref 4.6–6.5)

## 2017-06-23 LAB — LIPID PANEL
CHOLESTEROL: 244 mg/dL — AB (ref 0–200)
HDL: 38.8 mg/dL — ABNORMAL LOW (ref >39.00)
LDL Cholesterol: 167 mg/dL — ABNORMAL HIGH (ref 0–99)
NonHDL: 204.8
TRIGLYCERIDES: 187 mg/dL — AB (ref 0.0–149.0)
Total CHOL/HDL Ratio: 6
VLDL: 37.4 mg/dL (ref 0.0–40.0)

## 2017-06-23 LAB — VITAMIN B12: VITAMIN B 12: 255 pg/mL (ref 211–911)

## 2017-06-23 NOTE — Patient Instructions (Addendum)
You may be better off using an OTC H2 blocker for your acid reflux (such as Zantac/ ranitidine)- however if this does not control your symptoms you can continue to use omeprazole  We will set you up for your bone density exam Please ask Dr. Earlean Shawl about the Cologuard test; if this is appropriate for you I am glad to order it for you You got your Prevnar 13 pneumonia vaccine today- in a year we will do your Pneumovax 23  Depending on your cholesterol results we will decide about trying a non- statin medication for you   Health Maintenance for Postmenopausal Women Menopause is a normal process in which your reproductive ability comes to an end. This process happens gradually over a span of months to years, usually between the ages of 10 and 93. Menopause is complete when you have missed 12 consecutive menstrual periods. It is important to talk with your health care provider about some of the most common conditions that affect postmenopausal women, such as heart disease, cancer, and bone loss (osteoporosis). Adopting a healthy lifestyle and getting preventive care can help to promote your health and wellness. Those actions can also lower your chances of developing some of these common conditions. What should I know about menopause? During menopause, you may experience a number of symptoms, such as:  Moderate-to-severe hot flashes.  Night sweats.  Decrease in sex drive.  Mood swings.  Headaches.  Tiredness.  Irritability.  Memory problems.  Insomnia.  Choosing to treat or not to treat menopausal changes is an individual decision that you make with your health care provider. What should I know about hormone replacement therapy and supplements? Hormone therapy products are effective for treating symptoms that are associated with menopause, such as hot flashes and night sweats. Hormone replacement carries certain risks, especially as you become older. If you are thinking about using  estrogen or estrogen with progestin treatments, discuss the benefits and risks with your health care provider. What should I know about heart disease and stroke? Heart disease, heart attack, and stroke become more likely as you age. This may be due, in part, to the hormonal changes that your body experiences during menopause. These can affect how your body processes dietary fats, triglycerides, and cholesterol. Heart attack and stroke are both medical emergencies. There are many things that you can do to help prevent heart disease and stroke:  Have your blood pressure checked at least every 1-2 years. High blood pressure causes heart disease and increases the risk of stroke.  If you are 76-30 years old, ask your health care provider if you should take aspirin to prevent a heart attack or a stroke.  Do not use any tobacco products, including cigarettes, chewing tobacco, or electronic cigarettes. If you need help quitting, ask your health care provider.  It is important to eat a healthy diet and maintain a healthy weight. ? Be sure to include plenty of vegetables, fruits, low-fat dairy products, and lean protein. ? Avoid eating foods that are high in solid fats, added sugars, or salt (sodium).  Get regular exercise. This is one of the most important things that you can do for your health. ? Try to exercise for at least 150 minutes each week. The type of exercise that you do should increase your heart rate and make you sweat. This is known as moderate-intensity exercise. ? Try to do strengthening exercises at least twice each week. Do these in addition to the moderate-intensity exercise.  Know your  numbers.Ask your health care provider to check your cholesterol and your blood glucose. Continue to have your blood tested as directed by your health care provider.  What should I know about cancer screening? There are several types of cancer. Take the following steps to reduce your risk and to catch  any cancer development as early as possible. Breast Cancer  Practice breast self-awareness. ? This means understanding how your breasts normally appear and feel. ? It also means doing regular breast self-exams. Let your health care provider know about any changes, no matter how small.  If you are 50 or older, have a clinician do a breast exam (clinical breast exam or CBE) every year. Depending on your age, family history, and medical history, it may be recommended that you also have a yearly breast X-ray (mammogram).  If you have a family history of breast cancer, talk with your health care provider about genetic screening.  If you are at high risk for breast cancer, talk with your health care provider about having an MRI and a mammogram every year.  Breast cancer (BRCA) gene test is recommended for women who have family members with BRCA-related cancers. Results of the assessment will determine the need for genetic counseling and BRCA1 and for BRCA2 testing. BRCA-related cancers include these types: ? Breast. This occurs in males or females. ? Ovarian. ? Tubal. This may also be called fallopian tube cancer. ? Cancer of the abdominal or pelvic lining (peritoneal cancer). ? Prostate. ? Pancreatic.  Cervical, Uterine, and Ovarian Cancer Your health care provider may recommend that you be screened regularly for cancer of the pelvic organs. These include your ovaries, uterus, and vagina. This screening involves a pelvic exam, which includes checking for microscopic changes to the surface of your cervix (Pap test).  For women ages 21-65, health care providers may recommend a pelvic exam and a Pap test every three years. For women ages 45-65, they may recommend the Pap test and pelvic exam, combined with testing for human papilloma virus (HPV), every five years. Some types of HPV increase your risk of cervical cancer. Testing for HPV may also be done on women of any age who have unclear Pap test  results.  Other health care providers may not recommend any screening for nonpregnant women who are considered low risk for pelvic cancer and have no symptoms. Ask your health care provider if a screening pelvic exam is right for you.  If you have had past treatment for cervical cancer or a condition that could lead to cancer, you need Pap tests and screening for cancer for at least 20 years after your treatment. If Pap tests have been discontinued for you, your risk factors (such as having a new sexual partner) need to be reassessed to determine if you should start having screenings again. Some women have medical problems that increase the chance of getting cervical cancer. In these cases, your health care provider may recommend that you have screening and Pap tests more often.  If you have a family history of uterine cancer or ovarian cancer, talk with your health care provider about genetic screening.  If you have vaginal bleeding after reaching menopause, tell your health care provider.  There are currently no reliable tests available to screen for ovarian cancer.  Lung Cancer Lung cancer screening is recommended for adults 65-74 years old who are at high risk for lung cancer because of a history of smoking. A yearly low-dose CT scan of the lungs is  recommended if you:  Currently smoke.  Have a history of at least 30 pack-years of smoking and you currently smoke or have quit within the past 15 years. A pack-year is smoking an average of one pack of cigarettes per day for one year.  Yearly screening should:  Continue until it has been 15 years since you quit.  Stop if you develop a health problem that would prevent you from having lung cancer treatment.  Colorectal Cancer  This type of cancer can be detected and can often be prevented.  Routine colorectal cancer screening usually begins at age 65 and continues through age 48.  If you have risk factors for colon cancer, your health  care provider may recommend that you be screened at an earlier age.  If you have a family history of colorectal cancer, talk with your health care provider about genetic screening.  Your health care provider may also recommend using home test kits to check for hidden blood in your stool.  A small camera at the end of a tube can be used to examine your colon directly (sigmoidoscopy or colonoscopy). This is done to check for the earliest forms of colorectal cancer.  Direct examination of the colon should be repeated every 5-10 years until age 10. However, if early forms of precancerous polyps or small growths are found or if you have a family history or genetic risk for colorectal cancer, you may need to be screened more often.  Skin Cancer  Check your skin from head to toe regularly.  Monitor any moles. Be sure to tell your health care provider: ? About any new moles or changes in moles, especially if there is a change in a mole's shape or color. ? If you have a mole that is larger than the size of a pencil eraser.  If any of your family members has a history of skin cancer, especially at a young age, talk with your health care provider about genetic screening.  Always use sunscreen. Apply sunscreen liberally and repeatedly throughout the day.  Whenever you are outside, protect yourself by wearing long sleeves, pants, a wide-brimmed hat, and sunglasses.  What should I know about osteoporosis? Osteoporosis is a condition in which bone destruction happens more quickly than new bone creation. After menopause, you may be at an increased risk for osteoporosis. To help prevent osteoporosis or the bone fractures that can happen because of osteoporosis, the following is recommended:  If you are 67-31 years old, get at least 1,000 mg of calcium and at least 600 mg of vitamin D per day.  If you are older than age 68 but younger than age 79, get at least 1,200 mg of calcium and at least 600 mg of  vitamin D per day.  If you are older than age 25, get at least 1,200 mg of calcium and at least 800 mg of vitamin D per day.  Smoking and excessive alcohol intake increase the risk of osteoporosis. Eat foods that are rich in calcium and vitamin D, and do weight-bearing exercises several times each week as directed by your health care provider. What should I know about how menopause affects my mental health? Depression may occur at any age, but it is more common as you become older. Common symptoms of depression include:  Low or sad mood.  Changes in sleep patterns.  Changes in appetite or eating patterns.  Feeling an overall lack of motivation or enjoyment of activities that you previously enjoyed.  Frequent crying spells.  Talk with your health care provider if you think that you are experiencing depression. What should I know about immunizations? It is important that you get and maintain your immunizations. These include:  Tetanus, diphtheria, and pertussis (Tdap) booster vaccine.  Influenza every year before the flu season begins.  Pneumonia vaccine.  Shingles vaccine.  Your health care provider may also recommend other immunizations. This information is not intended to replace advice given to you by your health care provider. Make sure you discuss any questions you have with your health care provider. Document Released: 08/20/2005 Document Revised: 01/16/2016 Document Reviewed: 04/01/2015 Elsevier Interactive Patient Education  2018 [employer].

## 2017-06-25 ENCOUNTER — Encounter: Payer: Self-pay | Admitting: Family Medicine

## 2017-07-01 ENCOUNTER — Ambulatory Visit (HOSPITAL_BASED_OUTPATIENT_CLINIC_OR_DEPARTMENT_OTHER)
Admission: RE | Admit: 2017-07-01 | Discharge: 2017-07-01 | Disposition: A | Discharge status: Home / Self Care | Payer: 59 | Source: Ambulatory Visit | Attending: Family Medicine | Admitting: Family Medicine

## 2017-07-01 DIAGNOSIS — M85851 Other specified disorders of bone density and structure, right thigh: Secondary | ICD-10-CM | POA: Diagnosis not present

## 2017-07-01 DIAGNOSIS — E2839 Other primary ovarian failure: Secondary | ICD-10-CM

## 2017-07-02 ENCOUNTER — Encounter: Payer: Self-pay | Admitting: Family Medicine

## 2017-07-02 DIAGNOSIS — M8580 Other specified disorders of bone density and structure, unspecified site: Secondary | ICD-10-CM | POA: Insufficient documentation

## 2017-07-09 ENCOUNTER — Other Ambulatory Visit: Payer: Self-pay | Admitting: Family Medicine

## 2017-07-12 ENCOUNTER — Encounter: Payer: Self-pay | Admitting: Family Medicine

## 2017-07-13 MED ORDER — ICOSAPENT ETHYL 1 G PO CAPS: ORAL_CAPSULE | ORAL | 11 refills | Status: DC

## 2017-08-22 ENCOUNTER — Encounter: Payer: Self-pay | Admitting: Medical

## 2017-08-22 ENCOUNTER — Ambulatory Visit (INDEPENDENT_AMBULATORY_CARE_PROVIDER_SITE_OTHER): Payer: PPO | Admitting: Medical

## 2017-08-22 VITALS — BP 150/80 | HR 57 | Temp 98.3°F | Resp 16 | Ht 61.5 in | Wt 184.0 lb

## 2017-08-22 DIAGNOSIS — J3489 Other specified disorders of nose and nasal sinuses: Secondary | ICD-10-CM

## 2017-08-22 DIAGNOSIS — H938X1 Other specified disorders of right ear: Secondary | ICD-10-CM

## 2017-08-22 DIAGNOSIS — R030 Elevated blood-pressure reading, without diagnosis of hypertension: Secondary | ICD-10-CM

## 2017-08-22 MED ORDER — AZITHROMYCIN 250 MG PO TABS: ORAL_TABLET | ORAL | 0 refills | Status: DC

## 2017-08-22 MED ORDER — FLUTICASONE PROPIONATE 50 MCG/ACT NA SUSP: 2.0000 | Freq: Every day | NASAL | 2 refills | Status: DC

## 2017-08-22 MED ORDER — AZELASTINE HCL 0.1 % NA SOLN: 2.0000 | Freq: Two times a day (BID) | NASAL | 12 refills | Status: DC

## 2017-08-22 NOTE — Patient Instructions (Signed)
You may have recent allergies versus upper respiratory infection.  No wax seen in the right ear canal.  Tympanic membrane does not look infected.  Appears that you have eustachian tube dysfunction related to sinus pressure.  I want you to restart your Flonase daily and add Astelin nasal spray.  If with both sprays you have worsening sinus pressure, colored nasal drainage or increase ear pain then start azithromycin antibiotic.  Azithromycin is a printed prescription.  Your blood pressure is a little elevated today.  I would recommend that you check your blood pressure once every other day at home when you are relaxed to confirm if BP levels less than 140/90.  Follow-up in 7-10 days or as needed.

## 2017-08-22 NOTE — Progress Notes (Signed)
Subjective:    Patient ID: Peggy Santiago, female    DOB: 05/26/1951, 67 y.o.   MRN: [id number]  HPI  Pt in with rt ear feeling clogged like in a barrel. States little itch to her eyes. Slight burn to nose. Pt has been using spray comparable to flonase.(last 2 days did not use but last week was using it daily)  Pt has history of cerumen impacted.  Pt told in past had hole in her rt  tm membrane years ago. Told she had hole in membrane 3-4 years ago.  Last 2 days has got some sinus pain.   Review of Systems  Constitutional: Negative for chills, fatigue and fever.  HENT:       Ear congestion/pressure. Some nasal congestion.  Respiratory: Negative for cough, chest tightness, shortness of breath and wheezing.   Cardiovascular: Negative for chest pain and palpitations.  Gastrointestinal: Negative for abdominal pain.  Musculoskeletal: Negative for back pain, myalgias and neck pain.  Skin: Negative for rash.  Neurological: Negative for dizziness, tremors, speech difficulty, weakness, numbness and headaches.  Hematological: Negative for adenopathy. Does not bruise/bleed easily.  Psychiatric/Behavioral: Negative for behavioral problems, confusion, decreased concentration and dysphoric mood.    Past Medical History:  Diagnosis Date  . Allergy   . Anemia   . Chronic kidney disease    kidney stones  . Diabetes mellitus without complication (HCC)    borderline  . Osteoarthritis of knee   . Osteoarthritis of knee   . PONV (postoperative nausea and vomiting)      Social History   Socioeconomic History  . Marital status: Married    Spouse name: Annia Belt"  . Number of children: 2  . Years of education: 12th grade  . Highest education level: Not on file  Social Needs  . Financial resource strain: Not on file  . Food insecurity - worry: Not on file  . Food insecurity - inability: Not on file  . Transportation needs - medical: Not on file  . Transportation needs -  non-medical: Not on file  Occupational History  . Occupation: Health visitor  Tobacco Use  . Smoking status: Never Smoker  . Smokeless tobacco: Never Used  Substance and Sexual Activity  . Alcohol use: No    Alcohol/week: 0.0 oz  . Drug use: No  . Sexual activity: Yes    Birth control/protection: Surgical  Other Topics Concern  . Not on file  Social History Narrative   Lives with her husband.   Their grown daughters live locally.    Past Surgical History:  Procedure Laterality Date  . ABDOMINAL HYSTERECTOMY     Partial; one ovary remains  . ANKLE SURGERY     with plate  . APPENDECTOMY    . CHOLECYSTECTOMY    . colon resection due to SBO    . EYE SURGERY    . JOINT REPLACEMENT     2 scopes rt knee  . TOTAL KNEE ARTHROPLASTY Right 03/25/2014   Procedure: TOTAL KNEE ARTHROPLASTY;  Surgeon: Vickey Huger, MD;  Location: [place];  Service: Orthopedics;  Laterality: Right;    Family History  Problem Relation Age of Onset  . Heart disease Mother   . Rheum arthritis Mother   . Alzheimer's disease Mother   . Heart disease Father   . Diabetes Sister   . Heart disease Sister   . Hyperlipidemia Sister   . Cancer Brother   . Heart disease Brother   . Hyperlipidemia Brother   .  Hyperlipidemia Sister   . Heart disease Sister   . Diabetes Sister     Allergies  Allergen Reactions  . Statins     Body aches, has tried several times and does not tolerate well  . Contrast Media [Iodinated Diagnostic Agents] Hives, Rash and Other (See Comments)    ALL OVER THE BODY    Current Outpatient Medications on File Prior to Visit  Medication Sig Dispense Refill  . celecoxib (CELEBREX) 200 MG capsule TAKE ONE CAPSULE BY MOUTH ONCE DAILY 90 capsule 1  . cetirizine (ZYRTEC) 10 MG tablet Take 10 mg by mouth daily.    . cyanocobalamin (,VITAMIN B-12,) 1000 MCG/ML injection INJECT 1 ML (1,000 MCG TOTAL) INTO THE MUSCLE EVERY 30 (THIRTY) DAYS 3 mL 12  . Icosapent Ethyl (VASCEPA)  1 g CAPS Take 2 grams twice a day 120 capsule 11  . Multiple Vitamin (MULTIVITAMIN WITH MINERALS) TABS tablet Take 1 tablet by mouth daily.    Marland Kitchen omeprazole (PRILOSEC) 20 MG capsule Take 1 capsule (20 mg total) by mouth daily. 30 capsule 3   No current facility-administered medications on file prior to visit.     BP (!) 155/68 (BP Location: Right Arm, Patient Position: Sitting, Cuff Size: Small)   Pulse (!) 57   Temp 98.3 F (36.8 C) (Oral)   Resp 16   Ht 5' 1.5" (1.562 m)   Wt 184 lb (83.5 kg)   SpO2 98%   BMI 34.20 kg/m       Objective:   Physical Exam  General  Mental Status - Alert. General Appearance - Well groomed. Not in acute distress.  Skin Rashes- No Rashes.  HEENT Head- Normal. Ear Auditory Canal - Left- Normal. Right -redness to the TM.  Small circular oval old perforation that looks like healed..Tympanic Membrane- Left- Normal. Right- Normal. Eye Sclera/Conjunctiva- Left- Normal. Right- Normal. Nose & Sinuses Nasal Mucosa- Left-  Boggy and Congested. Right-  Boggy and  Congested.Bilateral faint maxillary and faint  frontal sinus pressure. Mouth & Throat Lips: Upper Lip- Normal: no dryness, cracking, pallor, cyanosis, or vesicular eruption. Lower Lip-Normal: no dryness, cracking, pallor, cyanosis or vesicular eruption. Buccal Mucosa- Bilateral- No Aphthous ulcers. Oropharynx- No Discharge or Erythema. Tonsils: Characteristics- Bilateral- No Erythema or Congestion. Size/Enlargement- Bilateral- No enlargement. Discharge- bilateral-None.  Neck Neck- Supple. No Masses.   Chest and Lung Exam Auscultation: Breath Sounds:-Clear even and unlabored.  Cardiovascular Auscultation:Rythm- Regular, rate and rhythm. Murmurs & Other Heart Sounds:Ausculatation of the heart reveal- No Murmurs.  Lymphatic Head & Neck General Head & Neck Lymphatics: Bilateral: Description- No Localized lymphadenopathy.       Assessment & Plan:  You may have recent allergies  versus upper respiratory infection.  No wax seen in the right ear canal.  Tympanic membrane does not look infected.  Appears that you have eustachian tube dysfunction related to sinus pressure.  I want you to restart your Flonase daily and add Astelin nasal spray.  If with both sprays you have worsening sinus pressure, colored nasal drainage or increase ear pain then start azithromycin antibiotic.  Azithromycin is a printed prescription.  Your blood pressure is a little elevated today.  I would recommend that you check your blood pressure once every other day at home when you are relaxed to confirm if BP levels less than 140/90.  Follow-up in 7-10 days or as needed.  Mackie Pai, PA-C

## 2017-09-13 ENCOUNTER — Telehealth: Payer: Self-pay | Admitting: Medical

## 2017-09-13 ENCOUNTER — Encounter: Payer: Self-pay | Admitting: Medical

## 2017-09-13 MED ORDER — NEOMYCIN-POLYMYXIN-HC 3.5-10000-1 OT SOLN
3.0000 [drp] | Freq: Three times a day (TID) | OTIC | 0 refills | Status: DC
Start: 2017-09-13 — End: 2018-06-29

## 2017-09-13 NOTE — Telephone Encounter (Signed)
Prescription of Cortisporin otic drops sent to patient's pharmacy.

## 2017-11-03 ENCOUNTER — Encounter: Payer: Self-pay | Admitting: Family Medicine

## 2017-11-03 DIAGNOSIS — Z1212 Encounter for screening for malignant neoplasm of rectum: Secondary | ICD-10-CM | POA: Diagnosis not present

## 2017-11-03 DIAGNOSIS — Z1211 Encounter for screening for malignant neoplasm of colon: Secondary | ICD-10-CM | POA: Diagnosis not present

## 2017-11-03 LAB — COLOGUARD

## 2017-11-17 ENCOUNTER — Encounter: Payer: Self-pay | Admitting: Family Medicine

## 2017-12-13 ENCOUNTER — Other Ambulatory Visit: Payer: Self-pay | Admitting: Family Medicine

## 2017-12-13 DIAGNOSIS — D510 Vitamin B12 deficiency anemia due to intrinsic factor deficiency: Secondary | ICD-10-CM

## 2018-02-13 ENCOUNTER — Other Ambulatory Visit: Payer: Self-pay | Admitting: Family Medicine

## 2018-02-13 DIAGNOSIS — M1712 Unilateral primary osteoarthritis, left knee: Secondary | ICD-10-CM

## 2018-02-21 NOTE — Progress Notes (Signed)
[place] at [facility] [address], [place], [place] [address] [phone]  Date:  02/22/2018   Name:  Peggy Santiago   DOB:  [birth year]   MRN:  [id number]  PCP:  Darreld Mclean, MD    Chief Complaint: Ear Pain (left ear pain, off and on several months, worse on friday, using ear drops-improving)   History of Present Illness:  Peggy Santiago is a 67 y.o. very pleasant female patient who presents with the following:  Here today with complaint of ear pain- she was here for RIGHT ear pain in February of this year - this got better with corticosporin drops.  She now has sx in both ears- started last week- she went back on her drops and it seemed to help right away. She used the drops for 3 days, then stopped.  Her sx are really resolved now and she thought about canceling the appt. However, she has had ear issues intermittently so she decided to come in anyway   She may feel "like I'm in a tunnel"- her hearing may be muffled at times  No blood from her ears Otherwise she is feeling well   She did go on vacation to [place] recently and did go snorkeling- she got home on 7/20 History of HTN, hyperlipidemia, DM  Lab Results  Component Value Date   HGBA1C 6.2 06/23/2017   eye exam: about 2 years ago- encouraged to have this done  a1c due   Patient Active Problem List   Diagnosis Date Noted  . Osteopenia 07/02/2017  . Dyslipidemia 06/15/2016  . Hyperlipidemia 06/13/2015  . Pernicious anemia 06/10/2015  . Allergic rhinitis 06/10/2015  . IBS (irritable bowel syndrome) 06/10/2015  . Nephrolithiasis 06/10/2015  . Migraine 06/10/2015  . Cervical spondylosis without myelopathy 06/10/2015  . DDD (degenerative disc disease), cervical 06/10/2015  . S/P total knee arthroplasty 03/25/2014  . Arthritis of knee, degenerative 02/26/2014  . Type II or unspecified type diabetes mellitus without mention of complication, not stated  as uncontrolled 02/23/2014  . Carpal tunnel syndrome 02/02/2013    Past Medical History:  Diagnosis Date  . Allergy   . Anemia   . Chronic kidney disease    kidney stones  . Diabetes mellitus without complication (HCC)    borderline  . Osteoarthritis of knee   . Osteoarthritis of knee   . PONV (postoperative nausea and vomiting)     Past Surgical History:  Procedure Laterality Date  . ABDOMINAL HYSTERECTOMY     Partial; one ovary remains  . ANKLE SURGERY     with plate  . APPENDECTOMY    . CHOLECYSTECTOMY    . colon resection due to SBO    . EYE SURGERY    . JOINT REPLACEMENT     2 scopes rt knee  . TOTAL KNEE ARTHROPLASTY Right 03/25/2014   Procedure: TOTAL KNEE ARTHROPLASTY;  Surgeon: Vickey Huger, MD;  Location: [facility];  Service: Orthopedics;  Laterality: Right;    Social History   Tobacco Use  . Smoking status: Never Smoker  . Smokeless tobacco: Never Used  Substance Use Topics  . Alcohol use: No    Alcohol/week: 0.0 standard drinks  . Drug use: No    Family History  Problem Relation Age of Onset  . Heart disease Mother   . Rheum arthritis Mother   . Alzheimer's disease Mother   . Heart disease Father   . Diabetes Sister   .  Heart disease Sister   . Hyperlipidemia Sister   . Cancer Brother   . Heart disease Brother   . Hyperlipidemia Brother   . Hyperlipidemia Sister   . Heart disease Sister   . Diabetes Sister     Allergies  Allergen Reactions  . Statins     Body aches, has tried several times and does not tolerate well  . Contrast Media [Iodinated Diagnostic Agents] Hives, Rash and Other (See Comments)    ALL OVER THE BODY  . Ioxaglate Rash and Hives    ALL OVER THE BODY    Medication list has been reviewed and updated.  Current Outpatient Medications on File Prior to Visit  Medication Sig Dispense Refill  . celecoxib (CELEBREX) 200 MG capsule TAKE 1 CAPSULE BY MOUTH ONCE DAILY 90 capsule 1  . cetirizine (ZYRTEC) 10 MG tablet Take 10 mg  by mouth daily.    . cyanocobalamin (,VITAMIN B-12,) 1000 MCG/ML injection INJECT 1 ML (1,000 MCG ) TOTAL INTO THE MUSCLE EVERY 30 DAYS 1 mL 30  . Multiple Vitamin (MULTIVITAMIN WITH MINERALS) TABS tablet Take 1 tablet by mouth daily.    Marland Kitchen neomycin-polymyxin-hydrocortisone (CORTISPORIN) OTIC solution Place 3 drops into both ears 3 (three) times daily. 10 mL 0  . omeprazole (PRILOSEC) 20 MG capsule Take 1 capsule (20 mg total) by mouth daily. 30 capsule 3   No current facility-administered medications on file prior to visit.     Review of Systems:  As per HPI- otherwise negative.   Physical Examination: Vitals:   02/22/18 1303  BP: 128/80  Pulse: 71  Resp: 16  Temp: 98.1 F (36.7 C)  SpO2: 98%   Vitals:   02/22/18 1303  Weight: 182 lb (82.6 kg)  Height: 5' 1.5" (1.562 m)   Body mass index is 33.83 kg/m. Ideal Body Weight: Weight in (lb) to have BMI = 25: 134.2  GEN: WDWN, NAD, Non-toxic, A & O x 3, obese, looks well  HEENT: Atraumatic, Normocephalic. Neck supple. No masses, No LAD. Possible ?defect in the right ear drum (may be healed, does not appear acute) Ear canals and TM otherwise normal  Ears and Nose: No external deformity. CV: RRR, No M/G/R. No JVD. No thrill. No extra heart sounds. PULM: CTA B, no wheezes, crackles, rhonchi. No retractions. No resp. distress. No accessory muscle use. ABD: S, NT, ND, +BS. No rebound. No HSM. EXTR: No c/c/e NEURO Normal gait.  PSYCH: Normally interactive. Conversant. Not depressed or anxious appearing.  Calm demeanor.    Assessment and Plan: Controlled type 2 diabetes mellitus without complication, without long-term current use of insulin ([place]) - Plan: Hemoglobin A1c  Recurrent otitis externa of both ears - Plan: Ambulatory referral to ENT  Recurrent ear issues- referral to ENT A1c today   Signed Lamar Blinks, MD  Results for orders placed or performed in visit on 02/22/18  Hemoglobin A1c  Result Value Ref Range    Hgb A1c MFr Bld 6.2 4.6 - 6.5 %   Message to pt- DM well controlled

## 2018-02-22 ENCOUNTER — Ambulatory Visit (INDEPENDENT_AMBULATORY_CARE_PROVIDER_SITE_OTHER): Payer: PPO | Admitting: Family Medicine

## 2018-02-22 ENCOUNTER — Encounter: Payer: Self-pay | Admitting: Family Medicine

## 2018-02-22 VITALS — BP 128/80 | HR 71 | Temp 98.1°F | Resp 16 | Ht 61.5 in | Wt 182.0 lb

## 2018-02-22 DIAGNOSIS — H6093 Unspecified otitis externa, bilateral: Secondary | ICD-10-CM | POA: Diagnosis not present

## 2018-02-22 DIAGNOSIS — E119 Type 2 diabetes mellitus without complications: Secondary | ICD-10-CM

## 2018-02-22 LAB — HEMOGLOBIN A1C: HEMOGLOBIN A1C: 6.2 % (ref 4.6–6.5)

## 2018-02-22 NOTE — Patient Instructions (Addendum)
I will refer you to see ENT to look at your ears for you Let me know if any other concerns in the meantime  I will be in touch with your A1c asap Please get your eye exam!

## 2018-03-01 DIAGNOSIS — H9193 Unspecified hearing loss, bilateral: Secondary | ICD-10-CM | POA: Diagnosis not present

## 2018-03-01 DIAGNOSIS — M26629 Arthralgia of temporomandibular joint, unspecified side: Secondary | ICD-10-CM | POA: Diagnosis not present

## 2018-03-01 DIAGNOSIS — K219 Gastro-esophageal reflux disease without esophagitis: Secondary | ICD-10-CM | POA: Diagnosis not present

## 2018-03-06 DIAGNOSIS — H903 Sensorineural hearing loss, bilateral: Secondary | ICD-10-CM | POA: Diagnosis not present

## 2018-03-27 DIAGNOSIS — Z23 Encounter for immunization: Secondary | ICD-10-CM | POA: Diagnosis not present

## 2018-05-12 DIAGNOSIS — M65311 Trigger thumb, right thumb: Secondary | ICD-10-CM | POA: Diagnosis not present

## 2018-05-12 DIAGNOSIS — M79644 Pain in right finger(s): Secondary | ICD-10-CM | POA: Diagnosis not present

## 2018-05-17 ENCOUNTER — Other Ambulatory Visit: Payer: Self-pay | Admitting: Family Medicine

## 2018-05-17 DIAGNOSIS — Z1231 Encounter for screening mammogram for malignant neoplasm of breast: Secondary | ICD-10-CM

## 2018-06-22 ENCOUNTER — Ambulatory Visit (HOSPITAL_BASED_OUTPATIENT_CLINIC_OR_DEPARTMENT_OTHER)
Admission: RE | Admit: 2018-06-22 | Discharge: 2018-06-22 | Disposition: A | Discharge status: Home / Self Care | Payer: Medicare Other | Source: Ambulatory Visit | Attending: Family Medicine | Admitting: Family Medicine

## 2018-06-22 ENCOUNTER — Encounter (HOSPITAL_BASED_OUTPATIENT_CLINIC_OR_DEPARTMENT_OTHER): Payer: Self-pay

## 2018-06-22 DIAGNOSIS — Z1231 Encounter for screening mammogram for malignant neoplasm of breast: Secondary | ICD-10-CM | POA: Diagnosis not present

## 2018-06-27 NOTE — Progress Notes (Addendum)
[place] at [facility] [address] [place], [place] [address] [phone]  Date:  06/29/2018   Name:  Peggy Santiago   DOB:  March 27, 1951   MRN:  [id number]  PCP:  Darreld Mclean, MD    Chief Complaint: Medicare Wellness (unsure if she needs blood work-will secondary insurance ay for labs?)   History of Present Illness:  Peggy Santiago is a 67 y.o. very pleasant female patient who presents with the following: Medicare wellness- does she want like a physical?  Here today for a periodic recheck visit. History of dyslipidemia, osteopenia, diet controled diabetes, and pernicious anemia. Last seen by myself in August of this year-at that time she had ear infection, we also checked her A1c.  Lab Results  Component Value Date   HGBA1C 6.2 02/22/2018   Mammo:12/19 Pap: s/p hyst Colon: cologuard done in April Labs: due for all - she is fasting today except for coffee Dexa: 12/18- osteopenia Eye exam: she is overdue, she will take care of it  Urine micro due Flu: done  Foot exam is due pneumonia; done at [facility] and she actually got a 2nd dose of prevnar-will alert her Suggest shingrix- she had zostavax 5 years ago   Generally she is doing well except for leg cramps on the right side only She is not sure if this leg is worse because she cannot fully straighten her right leg due to knee problems. Her leg cramps are relatively stable, and bother her mostly at night. She uses apple cider vinegar for her leg cramps  -? Helps a little Sometimes she has more cramping, worse if she is walks a lot or wears flats She has noted this for a couple of years now Rubbing the leg might help   She but then went back to her job part time; she is enjoying being back to work over the last month! Part time, has enjoyed being back at work, especially as she is now part-time  She has been on celebrex long term, for her knees.  It  does work well and she misses it if she does not use it.   She is on OTC prilosec - she tried to stop taking it but got GERD    Patient Active Problem List   Diagnosis Date Noted  . Osteopenia 07/02/2017  . Dyslipidemia 06/15/2016  . Hyperlipidemia 06/13/2015  . Pernicious anemia 06/10/2015  . Allergic rhinitis 06/10/2015  . IBS (irritable bowel syndrome) 06/10/2015  . Nephrolithiasis 06/10/2015  . Migraine 06/10/2015  . Cervical spondylosis without myelopathy 06/10/2015  . DDD (degenerative disc disease), cervical 06/10/2015  . S/P total knee arthroplasty 03/25/2014  . Arthritis of knee, degenerative 02/26/2014  . Type II or unspecified type diabetes mellitus without mention of complication, not stated as uncontrolled 02/23/2014  . Carpal tunnel syndrome 02/02/2013    Past Medical History:  Diagnosis Date  . Allergy   . Anemia   . Chronic kidney disease    kidney stones  . Diabetes mellitus without complication (HCC)    borderline  . Osteoarthritis of knee   . Osteoarthritis of knee   . PONV (postoperative nausea and vomiting)     Past Surgical History:  Procedure Laterality Date  . ABDOMINAL HYSTERECTOMY     Partial; one ovary remains  . ANKLE SURGERY     with plate  . APPENDECTOMY    . CHOLECYSTECTOMY    .  colon resection due to SBO    . EYE SURGERY    . JOINT REPLACEMENT     2 scopes rt knee  . TOTAL KNEE ARTHROPLASTY Right 03/25/2014   Procedure: TOTAL KNEE ARTHROPLASTY;  Surgeon: Vickey Huger, MD;  Location: [facility];  Service: Orthopedics;  Laterality: Right;    Social History   Tobacco Use  . Smoking status: Never Smoker  . Smokeless tobacco: Never Used  Substance Use Topics  . Alcohol use: No    Alcohol/week: 0.0 standard drinks  . Drug use: No    Family History  Problem Relation Age of Onset  . Heart disease Mother   . Rheum arthritis Mother   . Alzheimer's disease Mother   . Heart disease Father   . Diabetes Sister   . Heart disease Sister    . Hyperlipidemia Sister   . Cancer Brother   . Heart disease Brother   . Hyperlipidemia Brother   . Hyperlipidemia Sister   . Heart disease Sister   . Diabetes Sister     Allergies  Allergen Reactions  . Statins     Body aches, has tried several times and does not tolerate well  . Contrast Media [Iodinated Diagnostic Agents] Hives, Rash and Other (See Comments)    ALL OVER THE BODY  . Ioxaglate Rash and Hives    ALL OVER THE BODY    Medication list has been reviewed and updated.  Current Outpatient Medications on File Prior to Visit  Medication Sig Dispense Refill  . celecoxib (CELEBREX) 200 MG capsule TAKE 1 CAPSULE BY MOUTH ONCE DAILY 90 capsule 1  . cetirizine (ZYRTEC) 10 MG tablet Take 10 mg by mouth daily.    . cyanocobalamin (,VITAMIN B-12,) 1000 MCG/ML injection INJECT 1 ML (1,000 MCG ) TOTAL INTO THE MUSCLE EVERY 30 DAYS 1 mL 30  . Multiple Vitamin (MULTIVITAMIN WITH MINERALS) TABS tablet Take 1 tablet by mouth daily.    Marland Kitchen omeprazole (PRILOSEC) 20 MG capsule Take 1 capsule (20 mg total) by mouth daily. 30 capsule 3   No current facility-administered medications on file prior to visit.     Review of Systems:  As per HPI- otherwise negative.   Physical Examination: Vitals:   06/29/18 0912  BP: 126/70  Pulse: 68  Resp: 16  Temp: 98.5 F (36.9 C)  SpO2: 99%   Vitals:   06/29/18 0912  Weight: 186 lb (84.4 kg)  Height: 5' 1.5" (1.562 m)   Body mass index is 34.58 kg/m. Ideal Body Weight: Weight in (lb) to have BMI = 25: 134.2  GEN: WDWN, NAD, Non-toxic, A & O x 3, obese, looks well HEENT: Atraumatic, Normocephalic. Neck supple. No masses, No LAD. Ears and Nose: No external deformity. CV: RRR, No M/G/R. No JVD. No thrill. No extra heart sounds. PULM: CTA B, no wheezes, crackles, rhonchi. No retractions. No resp. distress. No accessory muscle use. ABD: S, NT, ND, +BS. No rebound. No HSM. EXTR: No c/c/e NEURO Normal gait.  PSYCH: Normally interactive.  Conversant. Not depressed or anxious appearing.  Calm demeanor.    Assessment and Plan: Controlled type 2 diabetes mellitus without complication, without long-term current use of insulin ([place]) - Plan: Comprehensive metabolic panel, Hemoglobin A1c, Microalbumin / creatinine urine ratio  Dyslipidemia - Plan: Lipid panel  Pernicious anemia - Plan: CBC, B12  Screening for deficiency anemia - Plan: CBC  Osteopenia, unspecified location  Primary osteoarthritis of left knee - Plan: celecoxib (CELEBREX) 200 MG capsule  Following  up today.  She had originally asked for a Medicare wellness, but what she really wanted was a checkup, like a traditional physical.  Labs pain as above.  She is caught up on her health maintenance at the moment, except for an eye exam.  She plans to do this ASAP.  Discussed Shingrix, she can have this done at her drugstore  Advised her regarding her pneumonia vaccine with her labs. Signed Lamar Blinks, MD  Received her labs, message to patient  Cannot tolerate statins   Results for orders placed or performed in visit on 06/29/18  CBC  Result Value Ref Range   WBC 5.4 4.0 - 10.5 K/uL   RBC 4.18 3.87 - 5.11 Mil/uL   Platelets 247.0 150.0 - 400.0 K/uL   Hemoglobin 12.6 12.0 - 15.0 g/dL   HCT 38.0 36.0 - 46.0 %   MCV 90.8 78.0 - 100.0 fl   MCHC 33.2 30.0 - 36.0 g/dL   RDW 13.6 11.5 - 15.5 %  Comprehensive metabolic panel  Result Value Ref Range   Sodium 141 135 - 145 mEq/L   Potassium 4.5 3.5 - 5.1 mEq/L   Chloride 107 96 - 112 mEq/L   CO2 27 19 - 32 mEq/L   Glucose, Bld 126 (H) 70 - 99 mg/dL   BUN 22 6 - 23 mg/dL   Creatinine, Ser 0.83 0.40 - 1.20 mg/dL   Total Bilirubin 0.4 0.2 - 1.2 mg/dL   Alkaline Phosphatase 55 39 - 117 U/L   AST 13 0 - 37 U/L   ALT 15 0 - 35 U/L   Total Protein 6.8 6.0 - 8.3 g/dL   Albumin 4.2 3.5 - 5.2 g/dL   Calcium 9.4 8.4 - 10.5 mg/dL   GFR 73.07 >60.00 mL/min  Hemoglobin A1c  Result Value Ref Range   Hgb A1c MFr Bld  6.1 4.6 - 6.5 %  Lipid panel  Result Value Ref Range   Cholesterol 220 (H) 0 - 200 mg/dL   Triglycerides 177.0 (H) 0.0 - 149.0 mg/dL   HDL 39.70 >39.00 mg/dL   VLDL 35.4 0.0 - 40.0 mg/dL   LDL Cholesterol 145 (H) 0 - 99 mg/dL   Total CHOL/HDL Ratio 6    NonHDL 180.08   Microalbumin / creatinine urine ratio  Result Value Ref Range   Microalb, Ur <0.7 0.0 - 1.9 mg/dL   Creatinine,U 138.6 mg/dL   Microalb Creat Ratio 0.5 0.0 - 30.0 mg/g  B12  Result Value Ref Range   Vitamin B-12 406 211 - 911 pg/mL   The 10-year ASCVD risk score Mikey Bussing DC Jr., et al., 2013) is: 13.3%   Values used to calculate the score:     Age: 50 years     Sex: Female     Is Non-Hispanic African American: No     Diabetic: Yes     Tobacco smoker: No     Systolic Blood Pressure: 627 mmHg     Is BP treated: No     HDL Cholesterol: 39.7 mg/dL     Total Cholesterol: 220 mg/dL

## 2018-06-29 ENCOUNTER — Encounter: Payer: Self-pay | Admitting: Family Medicine

## 2018-06-29 ENCOUNTER — Ambulatory Visit (INDEPENDENT_AMBULATORY_CARE_PROVIDER_SITE_OTHER): Payer: Medicare Other | Admitting: Family Medicine

## 2018-06-29 VITALS — BP 126/70 | HR 68 | Temp 98.5°F | Resp 16 | Ht 61.5 in | Wt 186.0 lb

## 2018-06-29 DIAGNOSIS — Z130 Encounter for screening for diseases of the blood and blood-forming organs and certain disorders involving the immune mechanism: Secondary | ICD-10-CM

## 2018-06-29 DIAGNOSIS — E119 Type 2 diabetes mellitus without complications: Secondary | ICD-10-CM

## 2018-06-29 DIAGNOSIS — M8580 Other specified disorders of bone density and structure, unspecified site: Secondary | ICD-10-CM

## 2018-06-29 DIAGNOSIS — M1712 Unilateral primary osteoarthritis, left knee: Secondary | ICD-10-CM

## 2018-06-29 DIAGNOSIS — D510 Vitamin B12 deficiency anemia due to intrinsic factor deficiency: Secondary | ICD-10-CM

## 2018-06-29 DIAGNOSIS — E785 Hyperlipidemia, unspecified: Secondary | ICD-10-CM | POA: Diagnosis not present

## 2018-06-29 LAB — LIPID PANEL
CHOL/HDL RATIO: 6
Cholesterol: 220 mg/dL — ABNORMAL HIGH (ref 0–200)
HDL: 39.7 mg/dL (ref >39.00)
LDL Cholesterol: 145 mg/dL — ABNORMAL HIGH (ref 0–99)
NONHDL: 180.08
Triglycerides: 177 mg/dL — ABNORMAL HIGH (ref 0.0–149.0)
VLDL: 35.4 mg/dL (ref 0.0–40.0)

## 2018-06-29 LAB — VITAMIN B12: Vitamin B-12: 406 pg/mL (ref 211–911)

## 2018-06-29 LAB — CBC
HEMATOCRIT: 38 % (ref 36.0–46.0)
Hemoglobin: 12.6 g/dL (ref 12.0–15.0)
MCHC: 33.2 g/dL (ref 30.0–36.0)
MCV: 90.8 fl (ref 78.0–100.0)
Platelets: 247 10*3/uL (ref 150.0–400.0)
RBC: 4.18 Mil/uL (ref 3.87–5.11)
RDW: 13.6 % (ref 11.5–15.5)
WBC: 5.4 10*3/uL (ref 4.0–10.5)

## 2018-06-29 LAB — COMPREHENSIVE METABOLIC PANEL
ALBUMIN: 4.2 g/dL (ref 3.5–5.2)
ALT: 15 U/L (ref 0–35)
AST: 13 U/L (ref 0–37)
Alkaline Phosphatase: 55 U/L (ref 39–117)
BILIRUBIN TOTAL: 0.4 mg/dL (ref 0.2–1.2)
BUN: 22 mg/dL (ref 6–23)
CO2: 27 mEq/L (ref 19–32)
CREATININE: 0.83 mg/dL (ref 0.40–1.20)
Calcium: 9.4 mg/dL (ref 8.4–10.5)
Chloride: 107 mEq/L (ref 96–112)
GFR: 73.07 mL/min (ref >60.00)
GLUCOSE: 126 mg/dL — AB (ref 70–99)
Potassium: 4.5 mEq/L (ref 3.5–5.1)
Sodium: 141 mEq/L (ref 135–145)
Total Protein: 6.8 g/dL (ref 6.0–8.3)

## 2018-06-29 LAB — MICROALBUMIN / CREATININE URINE RATIO
Creatinine,U: 138.6 mg/dL
Microalb Creat Ratio: 0.5 mg/g (ref 0.0–30.0)

## 2018-06-29 LAB — HEMOGLOBIN A1C: Hgb A1c MFr Bld: 6.1 % (ref 4.6–6.5)

## 2018-06-29 MED ORDER — CELECOXIB 200 MG PO CAPS: 200.0000 mg | ORAL_CAPSULE | Freq: Every day | ORAL | 3 refills | Status: DC

## 2018-06-29 NOTE — Patient Instructions (Signed)
Great to see you today, have a wonderful holiday season. I will be in touch with your labs ASAP.  Blood pressure looks fine. Please do get your eyes examined, and asked the eye doctor to send me the report if you would. Otherwise we are up-to-date, we will do a bone density next year. You can have the new shingles vaccine called Shingrix.  Please have this done at your [facility].

## 2018-09-27 DIAGNOSIS — M79644 Pain in right finger(s): Secondary | ICD-10-CM | POA: Diagnosis not present

## 2018-12-18 ENCOUNTER — Other Ambulatory Visit: Payer: Self-pay | Admitting: Family Medicine

## 2018-12-18 DIAGNOSIS — D510 Vitamin B12 deficiency anemia due to intrinsic factor deficiency: Secondary | ICD-10-CM

## 2018-12-22 DIAGNOSIS — H524 Presbyopia: Secondary | ICD-10-CM | POA: Diagnosis not present

## 2018-12-22 DIAGNOSIS — H5203 Hypermetropia, bilateral: Secondary | ICD-10-CM | POA: Diagnosis not present

## 2018-12-22 DIAGNOSIS — H2513 Age-related nuclear cataract, bilateral: Secondary | ICD-10-CM | POA: Diagnosis not present

## 2018-12-22 DIAGNOSIS — R7303 Prediabetes: Secondary | ICD-10-CM | POA: Diagnosis not present

## 2018-12-22 LAB — HM DIABETES EYE EXAM

## 2018-12-27 ENCOUNTER — Encounter: Payer: Self-pay | Admitting: Family Medicine

## 2019-02-05 DIAGNOSIS — M65311 Trigger thumb, right thumb: Secondary | ICD-10-CM | POA: Diagnosis not present

## 2019-02-19 DIAGNOSIS — M79644 Pain in right finger(s): Secondary | ICD-10-CM | POA: Diagnosis not present

## 2019-03-01 DIAGNOSIS — K5730 Diverticulosis of large intestine without perforation or abscess without bleeding: Secondary | ICD-10-CM | POA: Diagnosis not present

## 2019-03-01 DIAGNOSIS — K648 Other hemorrhoids: Secondary | ICD-10-CM | POA: Diagnosis not present

## 2019-03-01 DIAGNOSIS — K635 Polyp of colon: Secondary | ICD-10-CM | POA: Diagnosis not present

## 2019-03-01 DIAGNOSIS — Z1211 Encounter for screening for malignant neoplasm of colon: Secondary | ICD-10-CM | POA: Diagnosis not present

## 2019-03-01 DIAGNOSIS — D120 Benign neoplasm of cecum: Secondary | ICD-10-CM | POA: Diagnosis not present

## 2019-03-01 LAB — HM COLONOSCOPY

## 2019-03-05 DIAGNOSIS — M79644 Pain in right finger(s): Secondary | ICD-10-CM | POA: Diagnosis not present

## 2019-05-28 ENCOUNTER — Other Ambulatory Visit (HOSPITAL_BASED_OUTPATIENT_CLINIC_OR_DEPARTMENT_OTHER): Payer: Self-pay | Admitting: Family Medicine

## 2019-05-28 DIAGNOSIS — Z1231 Encounter for screening mammogram for malignant neoplasm of breast: Secondary | ICD-10-CM

## 2019-06-26 ENCOUNTER — Other Ambulatory Visit: Payer: Self-pay

## 2019-06-26 ENCOUNTER — Ambulatory Visit (HOSPITAL_BASED_OUTPATIENT_CLINIC_OR_DEPARTMENT_OTHER)
Admission: RE | Admit: 2019-06-26 | Discharge: 2019-06-26 | Disposition: A | Discharge status: Home / Self Care | Payer: Medicare Other | Source: Ambulatory Visit | Attending: Family Medicine | Admitting: Family Medicine

## 2019-06-26 ENCOUNTER — Encounter (HOSPITAL_BASED_OUTPATIENT_CLINIC_OR_DEPARTMENT_OTHER): Payer: Self-pay

## 2019-06-26 DIAGNOSIS — Z1231 Encounter for screening mammogram for malignant neoplasm of breast: Secondary | ICD-10-CM | POA: Diagnosis present

## 2019-06-29 ENCOUNTER — Other Ambulatory Visit: Payer: Self-pay

## 2019-06-29 NOTE — Progress Notes (Deleted)
[place] at [facility] [address] [place], [place] [address] [phone]  Date:  07/02/2019   Name:  Peggy Santiago   DOB:  [birth year]   MRN:  [id number]  PCP:  Darreld Mclean, MD    Chief Complaint: No chief complaint on file.   History of Present Illness:  Peggy Santiago is a 68 y.o. very pleasant female patient who presents with the following:  Here today for a wellness visit, Medicare patient  Peggy Santiago has history of IBS, diet controlled diabetes, arthritis, dyslipidemia, pernicious anemia, osteopenia  I last saw her about 1 year ago-at that time she had gone back to work part-time and was enjoying it She uses Celebrex for her knee pain Also over-the-counter Prilosec for GERD  A1c and labs are due Flu vaccine Pneumovax? Shingles vaccine Foot exam due today Eye exam up-to-date Colon cancer screening up-to-date Mammogram up-to-date DEXA due this year Status post hysterectomy-confirm reason   Patient Active Problem List   Diagnosis Date Noted  . Osteopenia 07/02/2017  . Dyslipidemia 06/15/2016  . Hyperlipidemia 06/13/2015  . Pernicious anemia 06/10/2015  . Allergic rhinitis 06/10/2015  . IBS (irritable bowel syndrome) 06/10/2015  . Nephrolithiasis 06/10/2015  . Migraine 06/10/2015  . Cervical spondylosis without myelopathy 06/10/2015  . DDD (degenerative disc disease), cervical 06/10/2015  . S/P total knee arthroplasty 03/25/2014  . Arthritis of knee, degenerative 02/26/2014  . Type II or unspecified type diabetes mellitus without mention of complication, not stated as uncontrolled 02/23/2014  . Carpal tunnel syndrome 02/02/2013    Past Medical History:  Diagnosis Date  . Allergy   . Anemia   . Chronic kidney disease    kidney stones  . Diabetes mellitus without complication (HCC)    borderline  . Osteoarthritis of knee   . Osteoarthritis of knee   . PONV (postoperative nausea and  vomiting)     Past Surgical History:  Procedure Laterality Date  . ABDOMINAL HYSTERECTOMY     Partial; one ovary remains  . ANKLE SURGERY     with plate  . APPENDECTOMY    . CHOLECYSTECTOMY    . colon resection due to SBO    . EYE SURGERY    . JOINT REPLACEMENT     2 scopes rt knee  . TOTAL KNEE ARTHROPLASTY Right 03/25/2014   Procedure: TOTAL KNEE ARTHROPLASTY;  Surgeon: Vickey Huger, MD;  Location: [facility];  Service: Orthopedics;  Laterality: Right;    Social History   Tobacco Use  . Smoking status: Never Smoker  . Smokeless tobacco: Never Used  Substance Use Topics  . Alcohol use: No    Alcohol/week: 0.0 standard drinks  . Drug use: No    Family History  Problem Relation Age of Onset  . Heart disease Mother   . Rheum arthritis Mother   . Alzheimer's disease Mother   . Heart disease Father   . Diabetes Sister   . Heart disease Sister   . Hyperlipidemia Sister   . Cancer Brother   . Heart disease Brother   . Hyperlipidemia Brother   . Hyperlipidemia Sister   . Heart disease Sister   . Diabetes Sister     Allergies  Allergen Reactions  . Statins     Body aches, has tried several times and does not tolerate well  . Contrast Media [Iodinated Diagnostic Agents] Hives, Rash and Other (See Comments)    ALL OVER THE BODY  .  Ioxaglate Rash and Hives    ALL OVER THE BODY    Medication list has been reviewed and updated.  Current Outpatient Medications on File Prior to Visit  Medication Sig Dispense Refill  . celecoxib (CELEBREX) 200 MG capsule Take 1 capsule (200 mg total) by mouth daily. 90 capsule 3  . cetirizine (ZYRTEC) 10 MG tablet Take 10 mg by mouth daily.    . cyanocobalamin (,VITAMIN B-12,) 1000 MCG/ML injection INJECT 1 ML (CC) INTRAMUSCULARLY ONCE EVERY MONTH 1 mL 5  . Multiple Vitamin (MULTIVITAMIN WITH MINERALS) TABS tablet Take 1 tablet by mouth daily.    Marland Kitchen omeprazole (PRILOSEC) 20 MG capsule Take 1 capsule (20 mg total) by mouth daily. 30 capsule 3    No current facility-administered medications on file prior to visit.    Review of Systems:  As per HPI- otherwise negative.   Physical Examination: There were no vitals filed for this visit. There were no vitals filed for this visit. There is no height or weight on file to calculate BMI. Ideal Body Weight:    GEN: WDWN, NAD, Non-toxic, A & O x 3 HEENT: Atraumatic, Normocephalic. Neck supple. No masses, No LAD. Ears and Nose: No external deformity. CV: RRR, No M/G/R. No JVD. No thrill. No extra heart sounds. PULM: CTA B, no wheezes, crackles, rhonchi. No retractions. No resp. distress. No accessory muscle use. ABD: S, NT, ND, +BS. No rebound. No HSM. EXTR: No c/c/e NEURO Normal gait.  PSYCH: Normally interactive. Conversant. Not depressed or anxious appearing.  Calm demeanor.  Foot exam  Assessment and Plan: *** This visit occurred during the SARS-CoV-2 public health emergency.  Safety protocols were in place, including screening questions prior to the visit, additional usage of staff PPE, and extensive cleaning of exam room while observing appropriate contact time as indicated for disinfecting solutions.    Signed Lamar Blinks, MD

## 2019-07-02 ENCOUNTER — Ambulatory Visit: Payer: Medicare Other | Admitting: Family Medicine

## 2019-07-17 NOTE — Patient Instructions (Addendum)
It was great to see you again today, I will be in touch with your labs ASAP Please consider getting the shingles vaccine at your pharmacy  Flu shot given today, as well as your 2nd pneumonia shot (pneumovax) Let's increase your prilosec to 40 mg for about 8 weeks, then go back to 20 mg.  Let me know how this works for you Ordered bone density exam for you as well    Health Maintenance After Age 68 After age 35, you are at a higher risk for certain long-term diseases and infections as well as injuries from falls. Falls are a major cause of broken bones and head injuries in people who are older than age 7. Getting regular preventive care can help to keep you healthy and well. Preventive care includes getting regular testing and making lifestyle changes as recommended by your health care provider. Talk with your health care provider about:  Which screenings and tests you should have. A screening is a test that checks for a disease when you have no symptoms.  A diet and exercise plan that is right for you. What should I know about screenings and tests to prevent falls? Screening and testing are the best ways to find a health problem early. Early diagnosis and treatment give you the best chance of managing medical conditions that are common after age 44. Certain conditions and lifestyle choices may make you more likely to have a fall. Your health care provider may recommend:  Regular vision checks. Poor vision and conditions such as cataracts can make you more likely to have a fall. If you wear glasses, make sure to get your prescription updated if your vision changes.  Medicine review. Work with your health care provider to regularly review all of the medicines you are taking, including over-the-counter medicines. Ask your health care provider about any side effects that may make you more likely to have a fall. Tell your health care provider if any medicines that you take make you feel dizzy or  sleepy.  Osteoporosis screening. Osteoporosis is a condition that causes the bones to get weaker. This can make the bones weak and cause them to break more easily.  Blood pressure screening. Blood pressure changes and medicines to control blood pressure can make you feel dizzy.  Strength and balance checks. Your health care provider may recommend certain tests to check your strength and balance while standing, walking, or changing positions.  Foot health exam. Foot pain and numbness, as well as not wearing proper footwear, can make you more likely to have a fall.  Depression screening. You may be more likely to have a fall if you have a fear of falling, feel emotionally low, or feel unable to do activities that you used to do.  Alcohol use screening. Using too much alcohol can affect your balance and may make you more likely to have a fall. What actions can I take to lower my risk of falls? General instructions  Talk with your health care provider about your risks for falling. Tell your health care provider if: ? You fall. Be sure to tell your health care provider about all falls, even ones that seem minor. ? You feel dizzy, sleepy, or off-balance.  Take over-the-counter and prescription medicines only as told by your health care provider. These include any supplements.  Eat a healthy diet and maintain a healthy weight. A healthy diet includes low-fat dairy products, low-fat (lean) meats, and fiber from whole grains, beans, and lots  of fruits and vegetables. Home safety  Remove any tripping hazards, such as rugs, cords, and clutter.  Install safety equipment such as grab bars in bathrooms and safety rails on stairs.  Keep rooms and walkways well-lit. Activity   Follow a regular exercise program to stay fit. This will help you maintain your balance. Ask your health care provider what types of exercise are appropriate for you.  If you need a cane or walker, use it as recommended by  your health care provider.  Wear supportive shoes that have nonskid soles. Lifestyle  Do not drink alcohol if your health care provider tells you not to drink.  If you drink alcohol, limit how much you have: ? 0-1 drink a day for women. ? 0-2 drinks a day for men.  Be aware of how much alcohol is in your drink. In the [place] one drink equals one typical bottle of beer (12 oz), one-half glass of wine (5 oz), or one shot of hard liquor (1 oz).  Do not use any products that contain nicotine or tobacco, such as cigarettes and e-cigarettes. If you need help quitting, ask your health care provider. Summary  Having a healthy lifestyle and getting preventive care can help to protect your health and wellness after age 1.  Screening and testing are the best way to find a health problem early and help you avoid having a fall. Early diagnosis and treatment give you the best chance for managing medical conditions that are more common for people who are older than age 73.  Falls are a major cause of broken bones and head injuries in people who are older than age 47. Take precautions to prevent a fall at home.  Work with your health care provider to learn what changes you can make to improve your health and wellness and to prevent falls. This information is not intended to replace advice given to you by your health care provider. Make sure you discuss any questions you have with your health care provider. Document Revised: 10/19/2018 Document Reviewed: 05/11/2017 Elsevier Patient Education  2020 [employer].

## 2019-07-17 NOTE — Progress Notes (Addendum)
[facility] [address], [place], [place] [address] [phone]  Date:  07/19/2019   Name:  Peggy Santiago   DOB:  1950-10-29   MRN:  [id number]  PCP:  Darreld Mclean, MD    Chief Complaint: Diabetes, Neck Pain (neck locks up), and Ear Popping (been to ENT, everything normal)   History of Present Illness:  Peggy Santiago is a 69 y.o. very pleasant female patient who presents with the following:  Here today for follow-up visit-Medicare patient so will not code his physical History of osteopenia, dyslipidemia, diabetes, migraine headache, cervical spine disease Last seen by myself about 1 year ago  Flu vaccine- give today  Due Pneumovax; she had 2 doses of prevnar 13 by mistake, first by [place] and then by [employer] in 2019 Explained this to patient, will give Pneumovax today.  She understands Foot exam due- do today Urine microalbumin and labs due- fasting today  Cologuard up-to-date Mammogram up-to-date DEXA scan due- order for her today  Status post hysterectomy  She has been trying to lose weight, is down a few pounds.  She is pleased with her progress She does note that if she hyperextends her neck, such as when the hairdresser is giving her shampoo, she may feels that her neck gets stuck in this position.  I advised her that given her neck disease it is probably best to avoid neck hyperextension  We discussed Covid vaccine today, and also Shingrix  She has noticed some increase in her GERD symptoms over the last couple of months.  She does take Prilosec 20 mg daily.  She is try using some Maalox and cutting down on spicy foods, this is helped some but she still has some discomfort. Patient Active Problem List   Diagnosis Date Noted  . Osteopenia 07/02/2017  . Dyslipidemia 06/15/2016  . Pernicious anemia 06/10/2015  . Allergic rhinitis 06/10/2015  . IBS (irritable bowel syndrome) 06/10/2015  .  Nephrolithiasis 06/10/2015  . Migraine 06/10/2015  . Cervical spondylosis without myelopathy 06/10/2015  . DDD (degenerative disc disease), cervical 06/10/2015  . S/P total knee arthroplasty 03/25/2014  . Arthritis of knee, degenerative 02/26/2014  . Type II or unspecified type diabetes mellitus without mention of complication, not stated as uncontrolled 02/23/2014  . Carpal tunnel syndrome 02/02/2013    Past Medical History:  Diagnosis Date  . Allergy   . Anemia   . Chronic kidney disease    kidney stones  . Diabetes mellitus without complication (HCC)    borderline  . Osteoarthritis of knee   . Osteoarthritis of knee   . PONV (postoperative nausea and vomiting)     Past Surgical History:  Procedure Laterality Date  . ABDOMINAL HYSTERECTOMY     Partial; one ovary remains  . ANKLE SURGERY     with plate  . APPENDECTOMY    . CHOLECYSTECTOMY    . colon resection due to SBO    . EYE SURGERY    . JOINT REPLACEMENT     2 scopes rt knee  . TOTAL KNEE ARTHROPLASTY Right 03/25/2014   Procedure: TOTAL KNEE ARTHROPLASTY;  Surgeon: Vickey Huger, MD;  Location: [facility];  Service: Orthopedics;  Laterality: Right;    Social History   Tobacco Use  . Smoking status: Never Smoker  . Smokeless tobacco: Never Used  Substance Use Topics  . Alcohol use: No    Alcohol/week: 0.0 standard drinks  .  Drug use: No    Family History  Problem Relation Age of Onset  . Heart disease Mother   . Rheum arthritis Mother   . Alzheimer's disease Mother   . Heart disease Father   . Diabetes Sister   . Heart disease Sister   . Hyperlipidemia Sister   . Cancer Brother   . Heart disease Brother   . Hyperlipidemia Brother   . Hyperlipidemia Sister   . Heart disease Sister   . Diabetes Sister     Allergies  Allergen Reactions  . Statins     Body aches, has tried several times and does not tolerate well  . Contrast Media [Iodinated Diagnostic Agents] Hives, Rash and Other (See Comments)     ALL OVER THE BODY  . Ioxaglate Rash and Hives    ALL OVER THE BODY    Medication list has been reviewed and updated.  Current Outpatient Medications on File Prior to Visit  Medication Sig Dispense Refill  . celecoxib (CELEBREX) 200 MG capsule Take 1 capsule (200 mg total) by mouth daily. 90 capsule 3  . cetirizine (ZYRTEC) 10 MG tablet Take 10 mg by mouth daily.    . cyanocobalamin (,VITAMIN B-12,) 1000 MCG/ML injection INJECT 1 ML (CC) INTRAMUSCULARLY ONCE EVERY MONTH 1 mL 5  . Multiple Vitamin (MULTIVITAMIN WITH MINERALS) TABS tablet Take 1 tablet by mouth daily.    Marland Kitchen omeprazole (PRILOSEC) 20 MG capsule Take 1 capsule (20 mg total) by mouth daily. 30 capsule 3   No current facility-administered medications on file prior to visit.    Review of Systems:  As per HPI- otherwise negative. No fever or chills, no chest pain or shortness of breath  Physical Examination: Vitals:   07/19/19 0928  BP: 124/82  Pulse: 61  Resp: 16  Temp: (!) 97.3 F (36.3 C)  SpO2: 97%   Vitals:   07/19/19 0928  Weight: 180 lb (81.6 kg)  Height: 5' 1.5" (1.562 m)   Body mass index is 33.46 kg/m. Ideal Body Weight: Weight in (lb) to have BMI = 25: 134.2  GEN: WDWN, NAD, Non-toxic, A & O x 3, obese, looks well HEENT: Atraumatic, Normocephalic. Neck supple. No masses, No LAD.Bilateral TM wnl, oropharynx normal.  PEERL,EOMI.   Ears and Nose: No external deformity. CV: RRR, No M/G/R. No JVD. No thrill. No extra heart sounds. PULM: CTA B, no wheezes, crackles, rhonchi. No retractions. No resp. distress. No accessory muscle use. ABD: S, NT, ND, +BS. No rebound. No HSM. EXTR: No c/c/e NEURO Normal gait.  PSYCH: Normally interactive. Conversant. Not depressed or anxious appearing.  Calm demeanor.  Foot exam- normal  Wt Readings from Last 3 Encounters:  07/19/19 180 lb (81.6 kg)  06/29/18 186 lb (84.4 kg)  02/22/18 182 lb (82.6 kg)     Assessment and Plan: Controlled type 2 diabetes mellitus  without complication, without long-term current use of insulin ([place]) - Plan: Comprehensive metabolic panel, Hemoglobin A1c, Microalbumin / creatinine urine ratio  Dyslipidemia - Plan: Lipid panel  Pernicious anemia - Plan: CBC  Osteopenia, unspecified location  Screening for thyroid disorder - Plan: TSH  Gastroesophageal reflux disease, unspecified whether esophagitis present - Plan: omeprazole (PRILOSEC) 40 MG capsule  B12 deficiency - Plan: B12  Estrogen deficiency - Plan: DG Bone Density  Immunization due - Plan: Pneumococcal (PPSV23) vaccine  Needs flu shot - Plan: Flu Vaccine QUAD High Dose(Fluad)  Here today for follow-up visit Labs pending as above Updated flu and pneumonia vaccines  today We will have her increase omeprazole to 40 mg for about 8 weeks, then drop back down to 20.  She will let me know how this works for her Will plan further follow- up pending labs.   This visit occurred during the SARS-CoV-2 public health emergency.  Safety protocols were in place, including screening questions prior to the visit, additional usage of staff PPE, and extensive cleaning of exam room while observing appropriate contact time as indicated for disinfecting solutions.    Signed Lamar Blinks, MD   Received her labs 1/8, message to patient  Results for orders placed or performed in visit on 07/19/19  CBC  Result Value Ref Range   WBC 5.4 4.0 - 10.5 K/uL   RBC 4.38 3.87 - 5.11 Mil/uL   Platelets 258.0 150.0 - 400.0 K/uL   Hemoglobin 13.2 12.0 - 15.0 g/dL   HCT 39.8 36.0 - 46.0 %   MCV 90.9 78.0 - 100.0 fl   MCHC 33.3 30.0 - 36.0 g/dL   RDW 13.5 11.5 - 15.5 %  Comprehensive metabolic panel  Result Value Ref Range   Sodium 141 135 - 145 mEq/L   Potassium 4.3 3.5 - 5.1 mEq/L   Chloride 107 96 - 112 mEq/L   CO2 26 19 - 32 mEq/L   Glucose, Bld 108 (H) 70 - 99 mg/dL   BUN 17 6 - 23 mg/dL   Creatinine, Ser 0.92 0.40 - 1.20 mg/dL   Total Bilirubin 0.8 0.2 - 1.2 mg/dL    Alkaline Phosphatase 57 39 - 117 U/L   AST 15 0 - 37 U/L   ALT 20 0 - 35 U/L   Total Protein 6.9 6.0 - 8.3 g/dL   Albumin 4.2 3.5 - 5.2 g/dL   GFR 60.85 >60.00 mL/min   Calcium 9.6 8.4 - 10.5 mg/dL  Hemoglobin A1c  Result Value Ref Range   Hgb A1c MFr Bld 6.1 4.6 - 6.5 %  Lipid panel  Result Value Ref Range   Cholesterol 283 (H) 0 - 200 mg/dL   Triglycerides 141.0 0.0 - 149.0 mg/dL   HDL 36.10 (L) >39.00 mg/dL   VLDL 28.2 0.0 - 40.0 mg/dL   LDL Cholesterol 218 (H) 0 - 99 mg/dL   Total CHOL/HDL Ratio 8    NonHDL 246.65   Microalbumin / creatinine urine ratio  Result Value Ref Range   Microalb, Ur 1.0 0.0 - 1.9 mg/dL   Creatinine,U 165.0 mg/dL   Microalb Creat Ratio 0.6 0.0 - 30.0 mg/g  TSH  Result Value Ref Range   TSH 0.95 0.35 - 4.50 uIU/mL  B12  Result Value Ref Range   Vitamin B-12 376 211 - 911 pg/mL

## 2019-07-18 ENCOUNTER — Other Ambulatory Visit: Payer: Self-pay

## 2019-07-19 ENCOUNTER — Ambulatory Visit (INDEPENDENT_AMBULATORY_CARE_PROVIDER_SITE_OTHER): Payer: Medicare Other | Admitting: Family Medicine

## 2019-07-19 ENCOUNTER — Other Ambulatory Visit: Payer: Self-pay

## 2019-07-19 ENCOUNTER — Encounter: Payer: Self-pay | Admitting: Family Medicine

## 2019-07-19 ENCOUNTER — Other Ambulatory Visit: Payer: Self-pay | Admitting: Family Medicine

## 2019-07-19 VITALS — BP 124/82 | HR 61 | Temp 97.3°F | Resp 16 | Ht 61.5 in | Wt 180.0 lb

## 2019-07-19 DIAGNOSIS — M8580 Other specified disorders of bone density and structure, unspecified site: Secondary | ICD-10-CM | POA: Diagnosis not present

## 2019-07-19 DIAGNOSIS — E119 Type 2 diabetes mellitus without complications: Secondary | ICD-10-CM | POA: Diagnosis not present

## 2019-07-19 DIAGNOSIS — K219 Gastro-esophageal reflux disease without esophagitis: Secondary | ICD-10-CM

## 2019-07-19 DIAGNOSIS — Z1329 Encounter for screening for other suspected endocrine disorder: Secondary | ICD-10-CM | POA: Diagnosis not present

## 2019-07-19 DIAGNOSIS — Z23 Encounter for immunization: Secondary | ICD-10-CM | POA: Diagnosis not present

## 2019-07-19 DIAGNOSIS — D510 Vitamin B12 deficiency anemia due to intrinsic factor deficiency: Secondary | ICD-10-CM | POA: Diagnosis not present

## 2019-07-19 DIAGNOSIS — E538 Deficiency of other specified B group vitamins: Secondary | ICD-10-CM | POA: Diagnosis not present

## 2019-07-19 DIAGNOSIS — E2839 Other primary ovarian failure: Secondary | ICD-10-CM

## 2019-07-19 DIAGNOSIS — E785 Hyperlipidemia, unspecified: Secondary | ICD-10-CM

## 2019-07-19 LAB — MICROALBUMIN / CREATININE URINE RATIO
Creatinine,U: 165 mg/dL
Microalb Creat Ratio: 0.6 mg/g (ref 0.0–30.0)
Microalb, Ur: 1 mg/dL (ref 0.0–1.9)

## 2019-07-19 LAB — TSH: TSH: 0.95 u[IU]/mL (ref 0.35–4.50)

## 2019-07-19 LAB — LIPID PANEL
Cholesterol: 283 mg/dL — ABNORMAL HIGH (ref 0–200)
HDL: 36.1 mg/dL — ABNORMAL LOW (ref >39.00)
LDL Cholesterol: 218 mg/dL — ABNORMAL HIGH (ref 0–99)
NonHDL: 246.65
Total CHOL/HDL Ratio: 8
Triglycerides: 141 mg/dL (ref 0.0–149.0)
VLDL: 28.2 mg/dL (ref 0.0–40.0)

## 2019-07-19 LAB — COMPREHENSIVE METABOLIC PANEL
ALT: 20 U/L (ref 0–35)
AST: 15 U/L (ref 0–37)
Albumin: 4.2 g/dL (ref 3.5–5.2)
Alkaline Phosphatase: 57 U/L (ref 39–117)
BUN: 17 mg/dL (ref 6–23)
CO2: 26 mEq/L (ref 19–32)
Calcium: 9.6 mg/dL (ref 8.4–10.5)
Chloride: 107 mEq/L (ref 96–112)
Creatinine, Ser: 0.92 mg/dL (ref 0.40–1.20)
GFR: 60.85 mL/min (ref >60.00)
Glucose, Bld: 108 mg/dL — ABNORMAL HIGH (ref 70–99)
Potassium: 4.3 mEq/L (ref 3.5–5.1)
Sodium: 141 mEq/L (ref 135–145)
Total Bilirubin: 0.8 mg/dL (ref 0.2–1.2)
Total Protein: 6.9 g/dL (ref 6.0–8.3)

## 2019-07-19 LAB — VITAMIN B12: Vitamin B-12: 376 pg/mL (ref 211–911)

## 2019-07-19 LAB — CBC
HCT: 39.8 % (ref 36.0–46.0)
Hemoglobin: 13.2 g/dL (ref 12.0–15.0)
MCHC: 33.3 g/dL (ref 30.0–36.0)
MCV: 90.9 fl (ref 78.0–100.0)
Platelets: 258 10*3/uL (ref 150.0–400.0)
RBC: 4.38 Mil/uL (ref 3.87–5.11)
RDW: 13.5 % (ref 11.5–15.5)
WBC: 5.4 10*3/uL (ref 4.0–10.5)

## 2019-07-19 LAB — HEMOGLOBIN A1C: Hgb A1c MFr Bld: 6.1 % (ref 4.6–6.5)

## 2019-07-19 MED ORDER — OMEPRAZOLE 40 MG PO CPDR: 40.0000 mg | DELAYED_RELEASE_CAPSULE | Freq: Every day | ORAL | 0 refills | Status: DC

## 2019-07-20 ENCOUNTER — Encounter: Payer: Self-pay | Admitting: Family Medicine

## 2019-07-20 DIAGNOSIS — E785 Hyperlipidemia, unspecified: Secondary | ICD-10-CM

## 2019-07-25 ENCOUNTER — Ambulatory Visit (INDEPENDENT_AMBULATORY_CARE_PROVIDER_SITE_OTHER): Payer: Medicare Other | Admitting: Family Medicine

## 2019-07-25 ENCOUNTER — Encounter: Payer: Self-pay | Admitting: Family Medicine

## 2019-07-25 ENCOUNTER — Other Ambulatory Visit: Payer: Self-pay

## 2019-07-25 DIAGNOSIS — H9203 Otalgia, bilateral: Secondary | ICD-10-CM

## 2019-07-25 DIAGNOSIS — G44209 Tension-type headache, unspecified, not intractable: Secondary | ICD-10-CM | POA: Diagnosis not present

## 2019-07-25 MED ORDER — PREDNISONE 20 MG PO TABS: 40.0000 mg | ORAL_TABLET | Freq: Every day | ORAL | 0 refills | Status: AC

## 2019-07-25 NOTE — Progress Notes (Addendum)
Chief Complaint  Patient presents with  . Headache    started saturday night, Covid test negative    Subjective: Patient is a 69 y.o. female here for headache. Due to COVID-19 pandemic, we are interacting via telephone. I verified patient's ID using 2 identifiers. Patient agreed to proceed with visit via this method. Patient is at home, I am at office. Patient, her spouse, and I are present for visit.   4 d of bilateral headache, ear pain and neck pain. No inj or change in activity. Has been using Tylenol and ess oils over neck.  These are helpful but wear off.  Her neck muscles do feel tight.  Denies jaw pain or change in diet.  No change in bedding.  Denies fevers, URI s/s's, balance issues, drainage from ears, difficulties with speech, trouble swallowing, weakness, or vision changes.  ROS:  Neuro: As noted in HPI  Past Medical History:  Diagnosis Date  . Allergy   . Anemia   . Chronic kidney disease    kidney stones  . Diabetes mellitus without complication (HCC)    borderline  . Osteoarthritis of knee   . PONV (postoperative nausea and vomiting)     Objective: No conversational dyspnea Age appropriate judgment and insight Nml affect and mood  Assessment and Plan: Acute non intractable tension-type headache - Plan: predniSONE (DELTASONE) 20 MG tablet  Acute ear pain, bilateral - Plan: predniSONE (DELTASONE) 20 MG tablet  She has known cervical spine disease per her PCPs most recent note.  We will send her stretches and exercises.  Recommended heat.  Prednisone burst as above, discussed it may increase her sugars temporarily.  Could cover for eustachian tube dysfunction as well.  If there is no improvement, I would recommend she come in to the office for an in person visit. Follow-up as originally scheduled with her regular PCP. Total time spent: 12 min The patient voiced understanding and agreement to the plan.  [place], DO 07/25/19  4:11 PM

## 2019-07-26 MED ORDER — OMEGA-3-ACID ETHYL ESTERS 1 G PO CAPS: 2.0000 g | ORAL_CAPSULE | Freq: Two times a day (BID) | ORAL | 11 refills | Status: DC

## 2019-07-30 ENCOUNTER — Telehealth: Payer: Self-pay

## 2019-07-30 NOTE — Telephone Encounter (Signed)
Lovaza not covered by Pt's plan- preferred alternative is Vascepa or fenofibrate. Please advise.

## 2019-07-31 ENCOUNTER — Ambulatory Visit (HOSPITAL_BASED_OUTPATIENT_CLINIC_OR_DEPARTMENT_OTHER)
Admission: RE | Admit: 2019-07-31 | Discharge: 2019-07-31 | Disposition: A | Discharge status: Home / Self Care | Payer: Medicare Other | Source: Ambulatory Visit | Attending: Family Medicine | Admitting: Family Medicine

## 2019-07-31 ENCOUNTER — Other Ambulatory Visit: Payer: Self-pay

## 2019-07-31 ENCOUNTER — Encounter: Payer: Self-pay | Admitting: Family Medicine

## 2019-07-31 DIAGNOSIS — Z780 Asymptomatic menopausal state: Secondary | ICD-10-CM | POA: Diagnosis not present

## 2019-07-31 DIAGNOSIS — E2839 Other primary ovarian failure: Secondary | ICD-10-CM | POA: Insufficient documentation

## 2019-07-31 DIAGNOSIS — E785 Hyperlipidemia, unspecified: Secondary | ICD-10-CM

## 2019-07-31 DIAGNOSIS — M85851 Other specified disorders of bone density and structure, right thigh: Secondary | ICD-10-CM | POA: Diagnosis not present

## 2019-07-31 MED ORDER — ICOSAPENT ETHYL 1 G PO CAPS: 2.0000 g | ORAL_CAPSULE | Freq: Two times a day (BID) | ORAL | 3 refills | Status: DC

## 2019-08-03 ENCOUNTER — Other Ambulatory Visit: Payer: Self-pay

## 2019-08-03 MED ORDER — VASCEPA 1 G PO CAPS: 2.0000 g | ORAL_CAPSULE | Freq: Two times a day (BID) | ORAL | 3 refills | Status: DC

## 2019-08-09 MED ORDER — SIMVASTATIN 10 MG PO TABS: 10.0000 mg | ORAL_TABLET | Freq: Every day | ORAL | 6 refills | Status: DC

## 2019-08-25 ENCOUNTER — Ambulatory Visit: Payer: Medicare Other

## 2019-09-07 ENCOUNTER — Encounter: Payer: Self-pay | Admitting: Family Medicine

## 2019-09-07 ENCOUNTER — Ambulatory Visit: Payer: Medicare Other | Attending: Internal Medicine

## 2019-09-07 DIAGNOSIS — Z20822 Contact with and (suspected) exposure to covid-19: Secondary | ICD-10-CM | POA: Diagnosis not present

## 2019-09-07 MED ORDER — ONDANSETRON HCL 8 MG PO TABS: 8.0000 mg | ORAL_TABLET | Freq: Three times a day (TID) | ORAL | 0 refills | Status: DC | PRN

## 2019-09-08 LAB — NOVEL CORONAVIRUS, NAA: SARS-CoV-2, NAA: DETECTED — AB

## 2019-09-09 ENCOUNTER — Other Ambulatory Visit: Payer: Self-pay | Admitting: Internal Medicine

## 2019-09-09 ENCOUNTER — Telehealth: Payer: Self-pay | Admitting: Unknown Physician Specialty

## 2019-09-09 ENCOUNTER — Ambulatory Visit (HOSPITAL_COMMUNITY)
Admission: RE | Admit: 2019-09-09 | Discharge: 2019-09-09 | Disposition: A | Discharge status: Home / Self Care | Payer: Medicare Other | Source: Ambulatory Visit | Attending: Pulmonary Disease | Admitting: Pulmonary Disease

## 2019-09-09 ENCOUNTER — Encounter (HOSPITAL_COMMUNITY): Payer: Self-pay

## 2019-09-09 DIAGNOSIS — U071 COVID-19: Secondary | ICD-10-CM | POA: Insufficient documentation

## 2019-09-09 DIAGNOSIS — Z23 Encounter for immunization: Secondary | ICD-10-CM | POA: Insufficient documentation

## 2019-09-09 MED ORDER — ACETAMINOPHEN 325 MG PO TABS
650.0000 mg | ORAL_TABLET | Freq: Once | ORAL | Status: AC
  Administered 2019-09-09: 650 mg via ORAL
  Filled 2019-09-09: qty 2

## 2019-09-09 MED ORDER — DIPHENHYDRAMINE HCL 50 MG/ML IJ SOLN: 50.0000 mg | Freq: Once | INTRAMUSCULAR | Status: DC | PRN

## 2019-09-09 MED ORDER — SODIUM CHLORIDE 0.9 % IV SOLN
INTRAVENOUS | Status: DC | PRN
  Administered 2019-09-09: 250 mL via INTRAVENOUS

## 2019-09-09 MED ORDER — ONDANSETRON HCL 4 MG/2ML IJ SOLN
4.0000 mg | Freq: Once | INTRAMUSCULAR | Status: AC
  Administered 2019-09-09: 4 mg via INTRAVENOUS
  Filled 2019-09-09: qty 2

## 2019-09-09 MED ORDER — ALBUTEROL SULFATE HFA 108 (90 BASE) MCG/ACT IN AERS: 2.0000 | INHALATION_SPRAY | Freq: Once | RESPIRATORY_TRACT | Status: DC | PRN

## 2019-09-09 MED ORDER — SODIUM CHLORIDE 0.9 % IV SOLN
700.0000 mg | Freq: Once | INTRAVENOUS | Status: AC
  Administered 2019-09-09: 700 mg via INTRAVENOUS
  Filled 2019-09-09: qty 20

## 2019-09-09 MED ORDER — FAMOTIDINE IN NACL 20-0.9 MG/50ML-% IV SOLN: 20.0000 mg | Freq: Once | INTRAVENOUS | Status: DC | PRN

## 2019-09-09 MED ORDER — METHYLPREDNISOLONE SODIUM SUCC 125 MG IJ SOLR: 125.0000 mg | Freq: Once | INTRAMUSCULAR | Status: DC | PRN

## 2019-09-09 MED ORDER — EPINEPHRINE 0.3 MG/0.3ML IJ SOAJ: 0.3000 mg | Freq: Once | INTRAMUSCULAR | Status: DC | PRN

## 2019-09-09 NOTE — Discharge Instructions (Signed)
COVID-19 COVID-19 is a respiratory infection that is caused by a virus called severe acute respiratory syndrome coronavirus 2 (SARS-CoV-2). The disease is also known as coronavirus disease or novel coronavirus. In some people, the virus may not cause any symptoms. In others, it may cause a serious infection. The infection can get worse quickly and can lead to complications, such as:  Pneumonia, or infection of the lungs.  Acute respiratory distress syndrome or ARDS. This is a condition in which fluid build-up in the lungs prevents the lungs from filling with air and passing oxygen into the blood.  Acute respiratory failure. This is a condition in which there is not enough oxygen passing from the lungs to the body or when carbon dioxide is not passing from the lungs out of the body.  Sepsis or septic shock. This is a serious bodily reaction to an infection.  Blood clotting problems.  Secondary infections due to bacteria or fungus.  Organ failure. This is when your body's organs stop working. The virus that causes COVID-19 is contagious. This means that it can spread from person to person through droplets from coughs and sneezes (respiratory secretions). What are the causes? This illness is caused by a virus. You may catch the virus by:  Breathing in droplets from an infected person. Droplets can be spread by a person breathing, speaking, singing, coughing, or sneezing.  Touching something, like a table or a doorknob, that was exposed to the virus (contaminated) and then touching your mouth, nose, or eyes. What increases the risk? Risk for infection You are more likely to be infected with this virus if you:  Are within 6 feet (2 meters) of a person with COVID-19.  Provide care for or live with a person who is infected with COVID-19.  Spend time in crowded indoor spaces or live in shared housing. Risk for serious illness You are more likely to become seriously ill from the virus if  you:  Are 50 years of age or older. The higher your age, the more you are at risk for serious illness.  Live in a nursing home or long-term care facility.  Have cancer.  Have a long-term (chronic) disease such as: ? Chronic lung disease, including chronic obstructive pulmonary disease or asthma. ? A long-term disease that lowers your body's ability to fight infection (immunocompromised). ? Heart disease, including heart failure, a condition in which the arteries that lead to the heart become narrow or blocked (coronary artery disease), a disease which makes the heart muscle thick, weak, or stiff (cardiomyopathy). ? Diabetes. ? Chronic kidney disease. ? Sickle cell disease, a condition in which red blood cells have an abnormal "sickle" shape. ? Liver disease.  Are obese. What are the signs or symptoms? Symptoms of this condition can range from mild to severe. Symptoms may appear any time from 2 to 14 days after being exposed to the virus. They include:  A fever or chills.  A cough.  Difficulty breathing.  Headaches, body aches, or muscle aches.  Runny or stuffy (congested) nose.  A sore throat.  New loss of taste or smell. Some people may also have stomach problems, such as nausea, vomiting, or diarrhea. Other people may not have any symptoms of COVID-19. How is this diagnosed? This condition may be diagnosed based on:  Your signs and symptoms, especially if: ? You live in an area with a COVID-19 outbreak. ? You recently traveled to or from an area where the virus is common. ? You   provide care for or live with a person who was diagnosed with COVID-19. ? You were exposed to a person who was diagnosed with COVID-19.  A physical exam.  Lab tests, which may include: ? Taking a sample of fluid from the back of your nose and throat (nasopharyngeal fluid), your nose, or your throat using a swab. ? A sample of mucus from your lungs (sputum). ? Blood tests.  Imaging tests,  which may include, X-rays, CT scan, or ultrasound. How is this treated? At present, there is no medicine to treat COVID-19. Medicines that treat other diseases are being used on a trial basis to see if they are effective against COVID-19. Your health care provider will talk with you about ways to treat your symptoms. For most people, the infection is mild and can be managed at home with rest, fluids, and over-the-counter medicines. Treatment for a serious infection usually takes places in a hospital intensive care unit (ICU). It may include one or more of the following treatments. These treatments are given until your symptoms improve.  Receiving fluids and medicines through an IV.  Supplemental oxygen. Extra oxygen is given through a tube in the nose, a face mask, or a hood.  Positioning you to lie on your stomach (prone position). This makes it easier for oxygen to get into the lungs.  Continuous positive airway pressure (CPAP) or bi-level positive airway pressure (BPAP) machine. This treatment uses mild air pressure to keep the airways open. A tube that is connected to a motor delivers oxygen to the body.  Ventilator. This treatment moves air into and out of the lungs by using a tube that is placed in your windpipe.  Tracheostomy. This is a procedure to create a hole in the neck so that a breathing tube can be inserted.  Extracorporeal membrane oxygenation (ECMO). This procedure gives the lungs a chance to recover by taking over the functions of the heart and lungs. It supplies oxygen to the body and removes carbon dioxide. Follow these instructions at home: Lifestyle  If you are sick, stay home except to get medical care. Your health care provider will tell you how long to stay home. Call your health care provider before you go for medical care.  Rest at home as told by your health care provider.  Do not use any products that contain nicotine or tobacco, such as cigarettes,  e-cigarettes, and chewing tobacco. If you need help quitting, ask your health care provider.  Return to your normal activities as told by your health care provider. Ask your health care provider what activities are safe for you. General instructions  Take over-the-counter and prescription medicines only as told by your health care provider.  Drink enough fluid to keep your urine pale yellow.  Keep all follow-up visits as told by your health care provider. This is important. How is this prevented?  There is no vaccine to help prevent COVID-19 infection. However, there are steps you can take to protect yourself and others from this virus. To protect yourself:   Do not travel to areas where COVID-19 is a risk. The areas where COVID-19 is reported change often. To identify high-risk areas and travel restrictions, check the CDC travel website: wwwnc.cdc.gov/travel/notices  If you live in, or must travel to, an area where COVID-19 is a risk, take precautions to avoid infection. ? Stay away from people who are sick. ? Wash your hands often with soap and water for 20 seconds. If soap and water   are not available, use an alcohol-based hand sanitizer. ? Avoid touching your mouth, face, eyes, or nose. ? Avoid going out in public, follow guidance from your state and local health authorities. ? If you must go out in public, wear a cloth face covering or face mask. Make sure your mask covers your nose and mouth. ? Avoid crowded indoor spaces. Stay at least 6 feet (2 meters) away from others. ? Disinfect objects and surfaces that are frequently touched every day. This may include:  Counters and tables.  Doorknobs and light switches.  Sinks and faucets.  Electronics, such as phones, remote controls, keyboards, computers, and tablets. To protect others: If you have symptoms of COVID-19, take steps to prevent the virus from spreading to others.  If you think you have a COVID-19 infection, contact  your health care provider right away. Tell your health care team that you think you may have a COVID-19 infection.  Stay home. Leave your house only to seek medical care. Do not use public transport.  Do not travel while you are sick.  Wash your hands often with soap and water for 20 seconds. If soap and water are not available, use alcohol-based hand sanitizer.  Stay away from other members of your household. Let healthy household members care for children and pets, if possible. If you have to care for children or pets, wash your hands often and wear a mask. If possible, stay in your own room, separate from others. Use a different bathroom.  Make sure that all people in your household wash their hands well and often.  Cough or sneeze into a tissue or your sleeve or elbow. Do not cough or sneeze into your hand or into the air.  Wear a cloth face covering or face mask. Make sure your mask covers your nose and mouth. Where to find more information  Centers for Disease Control and Prevention: www.cdc.gov/coronavirus/2019-ncov/index.html  World Health Organization: www.who.int/health-topics/coronavirus Contact a health care provider if:  You live in or have traveled to an area where COVID-19 is a risk and you have symptoms of the infection.  You have had contact with someone who has COVID-19 and you have symptoms of the infection. Get help right away if:  You have trouble breathing.  You have pain or pressure in your chest.  You have confusion.  You have bluish lips and fingernails.  You have difficulty waking from sleep.  You have symptoms that get worse. These symptoms may represent a serious problem that is an emergency. Do not wait to see if the symptoms will go away. Get medical help right away. Call your local emergency services (911 in the [place] Do not drive yourself to the hospital. Let the emergency medical personnel know if you think you have  COVID-19. Summary  COVID-19 is a respiratory infection that is caused by a virus. It is also known as coronavirus disease or novel coronavirus. It can cause serious infections, such as pneumonia, acute respiratory distress syndrome, acute respiratory failure, or sepsis.  The virus that causes COVID-19 is contagious. This means that it can spread from person to person through droplets from breathing, speaking, singing, coughing, or sneezing.  You are more likely to develop a serious illness if you are 50 years of age or older, have a weak immune system, live in a nursing home, or have chronic disease.  There is no medicine to treat COVID-19. Your health care provider will talk with you about ways to treat your symptoms.    Take steps to protect yourself and others from infection. Wash your hands often and disinfect objects and surfaces that are frequently touched every day. Stay away from people who are sick and wear a mask if you are sick. This information is not intended to replace advice given to you by your health care provider. Make sure you discuss any questions you have with your health care provider. Document Revised: 04/27/2019 Document Reviewed: 08/03/2018 Elsevier Patient Education  2020 Elsevier Inc. What types of side effects do monoclonal antibody drugs cause?  Common side effects  In general, the more common side effects caused by monoclonal antibody drugs include: . Allergic reactions, such as hives or itching . Flu-like signs and symptoms, including chills, fatigue, fever, and muscle aches and pains . Nausea, vomiting . Diarrhea . Skin rashes . Low blood pressure   The CDC is recommending patients who receive monoclonal antibody treatments wait at least 90 days before being vaccinated.  Currently, there are no data on the safety and efficacy of mRNA COVID-19 vaccines in persons who received monoclonal antibodies or convalescent plasma as part of COVID-19 treatment. Based  on the estimated half-life of such therapies as well as evidence suggesting that reinfection is uncommon in the 90 days after initial infection, vaccination should be deferred for at least 90 days, as a precautionary measure until additional information becomes available, to avoid interference of the antibody treatment with vaccine-induced immune responses. 

## 2019-09-09 NOTE — Progress Notes (Signed)
  Diagnosis: COVID-19  Physician:dr wright  Procedure: [facility] Med: bamlanivimab infusion - Provided patient with bamlanimivab fact sheet for patients, parents and caregivers prior to infusion.  Complications: No immediate complications noted.  Discharge: Discharged home   [place] 09/09/2019

## 2019-09-09 NOTE — Progress Notes (Signed)
  I connected by phone with Peggy Santiago on 09/09/2019 at 8:47 AM to discuss the potential use of an new treatment for mild to moderate COVID-19 viral infection in non-hospitalized patients.  This patient is a 69 y.o. female that meets the FDA criteria for Emergency Use Authorization of bamlanivimab or casirivimab\imdevimab.  Has a (+) direct SARS-CoV-2 viral test result  Has mild or moderate COVID-19   Is ? 69 years of age and weighs ? 40 kg  Is NOT hospitalized due to COVID-19  Is NOT requiring oxygen therapy or requiring an increase in baseline oxygen flow rate due to COVID-19  Is within 10 days of symptom onset  Has at least one of the high risk factor(s) for progression to severe COVID-19 and/or hospitalization as defined in EUA.  Specific high risk criteria : >/= 68 yo, DM2  I have spoken and communicated the following to the patient or parent/caregiver:  1. FDA has authorized the emergency use of bamlanivimab and casirivimab\imdevimab for the treatment of mild to moderate COVID-19 in adults and pediatric patients with positive results of direct SARS-CoV-2 viral testing who are 4 years of age and older weighing at least 40 kg, and who are at high risk for progressing to severe COVID-19 and/or hospitalization.  2. The significant known and potential risks and benefits of bamlanivimab and casirivimab\imdevimab, and the extent to which such potential risks and benefits are unknown.  3. Information on available alternative treatments and the risks and benefits of those alternatives, including clinical trials.  4. Patients treated with bamlanivimab and casirivimab\imdevimab should continue to self-isolate and use infection control measures (e.g., wear mask, isolate, social distance, avoid sharing personal items, clean and disinfect "high touch" surfaces, and frequent handwashing) according to CDC guidelines.   5. The patient or parent/caregiver has the option to accept or refuse  bamlanivimab or casirivimab\imdevimab .  After reviewing this information with the patient, The patient agreed to proceed with receiving the bamlanimivab infusion and will be provided a copy of the Fact sheet prior to receiving the infusion.   Infusion scheduled for 2/28 at 1030. Day 9 of symptoms  Alan Ripper, NP-C [place]

## 2019-09-09 NOTE — Telephone Encounter (Signed)
Called to discuss with patient about Covid symptoms and the use of bamlanivimab, a monoclonal antibody infusion for those with mild to moderate Covid symptoms and at a high risk of hospitalization.  Pt is qualified for this infusion at the [facility] infusion center due to Age > 45 and Diabetes   Message left to call back

## 2019-09-20 ENCOUNTER — Telehealth: Payer: Self-pay | Admitting: Family Medicine

## 2019-10-08 ENCOUNTER — Other Ambulatory Visit: Payer: Self-pay | Admitting: Family Medicine

## 2019-10-08 DIAGNOSIS — M1712 Unilateral primary osteoarthritis, left knee: Secondary | ICD-10-CM

## 2019-12-19 ENCOUNTER — Other Ambulatory Visit: Payer: Self-pay | Admitting: Family Medicine

## 2019-12-19 DIAGNOSIS — K219 Gastro-esophageal reflux disease without esophagitis: Secondary | ICD-10-CM

## 2020-01-21 ENCOUNTER — Other Ambulatory Visit: Payer: Self-pay | Admitting: Family Medicine

## 2020-01-21 DIAGNOSIS — M1712 Unilateral primary osteoarthritis, left knee: Secondary | ICD-10-CM

## 2020-02-21 ENCOUNTER — Encounter: Payer: Self-pay | Admitting: Family Medicine

## 2020-03-03 DIAGNOSIS — Z23 Encounter for immunization: Secondary | ICD-10-CM | POA: Diagnosis not present

## 2020-03-24 ENCOUNTER — Other Ambulatory Visit: Payer: Self-pay

## 2020-03-24 ENCOUNTER — Encounter: Payer: Self-pay | Admitting: Family Medicine

## 2020-03-24 ENCOUNTER — Telehealth (INDEPENDENT_AMBULATORY_CARE_PROVIDER_SITE_OTHER): Payer: Medicare Other | Admitting: Family Medicine

## 2020-03-24 DIAGNOSIS — J069 Acute upper respiratory infection, unspecified: Secondary | ICD-10-CM | POA: Diagnosis not present

## 2020-03-24 DIAGNOSIS — R05 Cough: Secondary | ICD-10-CM

## 2020-03-24 DIAGNOSIS — R059 Cough, unspecified: Secondary | ICD-10-CM

## 2020-03-24 MED ORDER — HYDROCODONE-HOMATROPINE 5-1.5 MG/5ML PO SYRP: 5.0000 mL | ORAL_SOLUTION | Freq: Three times a day (TID) | ORAL | 0 refills | Status: AC | PRN

## 2020-03-24 MED ORDER — ALBUTEROL SULFATE HFA 108 (90 BASE) MCG/ACT IN AERS: 2.0000 | INHALATION_SPRAY | Freq: Four times a day (QID) | RESPIRATORY_TRACT | 0 refills | Status: DC | PRN

## 2020-03-24 MED ORDER — DOXYCYCLINE HYCLATE 100 MG PO TABS: 100.0000 mg | ORAL_TABLET | Freq: Two times a day (BID) | ORAL | 0 refills | Status: DC

## 2020-03-24 NOTE — Progress Notes (Signed)
[facility] [address], [place], [place] [address] [phone]  Date:  03/24/2020   Name:  Peggy Santiago   DOB:  1951-03-28   MRN:  [id number]  PCP:  Darreld Mclean, MD    Chief Complaint: No chief complaint on file.   History of Present Illness:  Peggy Santiago is a 69 y.o. very pleasant female patient who presents with the following:  Virtual visit today to discuss illness History of osteopenia, dyslipidemia, diabetes, migraine headache, cervical spine disease Lab Results  Component Value Date   HGBA1C 6.1 07/19/2019     Patient location is home, provider location is office.  Patient identity is confirmed with 2 factors, she gives consent for virtual visit today.  The patient and myself are present on the call today Last seen by myself in January of this year She had covid 19 earlier this year -in February.  Received infusion treatment  She denies current illness, started about 1 week ago with nasal sx and ST Sx now seem to be getting into her chest and she noted wheezing this am Mild cough, perhaps getting worse She tried some OTC cold meds, mucinex, nyquil No fever No vomiting or diarrhea She got her first dose of covid 19 about 3 weeks ago- she plans to do her 2nd shot next week  Patient Active Problem List   Diagnosis Date Noted  . Osteopenia 07/02/2017  . Dyslipidemia 06/15/2016  . Pernicious anemia 06/10/2015  . Allergic rhinitis 06/10/2015  . IBS (irritable bowel syndrome) 06/10/2015  . Nephrolithiasis 06/10/2015  . Migraine 06/10/2015  . Cervical spondylosis without myelopathy 06/10/2015  . DDD (degenerative disc disease), cervical 06/10/2015  . S/P total knee arthroplasty 03/25/2014  . Arthritis of knee, degenerative 02/26/2014  . Type II or unspecified type diabetes mellitus without mention of complication, not stated as uncontrolled 02/23/2014  . Carpal tunnel syndrome  02/02/2013    Past Medical History:  Diagnosis Date  . Allergy   . Anemia   . Chronic kidney disease    kidney stones  . Diabetes mellitus without complication (HCC)    borderline  . Osteoarthritis of knee   . PONV (postoperative nausea and vomiting)     Past Surgical History:  Procedure Laterality Date  . ABDOMINAL HYSTERECTOMY     Partial; one ovary remains  . ANKLE SURGERY     with plate  . APPENDECTOMY    . CHOLECYSTECTOMY    . colon resection due to SBO    . EYE SURGERY    . JOINT REPLACEMENT     2 scopes rt knee  . TOTAL KNEE ARTHROPLASTY Right 03/25/2014   Procedure: TOTAL KNEE ARTHROPLASTY;  Surgeon: Vickey Huger, MD;  Location: [facility];  Service: Orthopedics;  Laterality: Right;    Social History   Tobacco Use  . Smoking status: Never Smoker  . Smokeless tobacco: Never Used  Substance Use Topics  . Alcohol use: No    Alcohol/week: 0.0 standard drinks  . Drug use: No    Family History  Problem Relation Age of Onset  . Heart disease Mother   . Rheum arthritis Mother   . Alzheimer's disease Mother   . Heart disease Father   . Diabetes Sister   . Heart disease Sister   . Hyperlipidemia Sister   . Cancer Brother   . Heart disease Brother   . Hyperlipidemia Brother   . Hyperlipidemia Sister   .  Heart disease Sister   . Diabetes Sister     Allergies  Allergen Reactions  . Statins     Body aches, has tried several times and does not tolerate well  . Contrast Media [Iodinated Diagnostic Agents] Hives, Rash and Other (See Comments)    ALL OVER THE BODY  . Ioxaglate Rash and Hives    ALL OVER THE BODY    Medication list has been reviewed and updated.  Current Outpatient Medications on File Prior to Visit  Medication Sig Dispense Refill  . celecoxib (CELEBREX) 200 MG capsule Take 1 capsule (200 mg total) by mouth daily. 90 capsule 0  . cetirizine (ZYRTEC) 10 MG tablet Take 10 mg by mouth daily.    . cyanocobalamin (,VITAMIN B-12,) 1000 MCG/ML  injection INJECT 1 ML INTO THE MUSCLE  ONCE EVERY MONTH 1 mL 12  . Multiple Vitamin (MULTIVITAMIN WITH MINERALS) TABS tablet Take 1 tablet by mouth daily.    Marland Kitchen omeprazole (PRILOSEC) 40 MG capsule Take 1 capsule by mouth once daily 90 capsule 3  . ondansetron (ZOFRAN) 8 MG tablet Take 1 tablet (8 mg total) by mouth every 8 (eight) hours as needed for nausea or vomiting. 30 tablet 0  . simvastatin (ZOCOR) 10 MG tablet Take 1 tablet (10 mg total) by mouth at bedtime. 30 tablet 6  . VASCEPA 1 g capsule Take 2 capsules (2 g total) by mouth 2 (two) times daily. 360 capsule 3   No current facility-administered medications on file prior to visit.    Review of Systems:  .apser   Physical Examination: There were no vitals filed for this visit. There were no vitals filed for this visit. There is no height or weight on file to calculate BMI. Ideal Body Weight:    Pt observed via video -she looks well and her normal self, but her voice is quite hoarse.  She coughs on occasion during visit  she is not checking vital signs at home   Assessment and Plan: Upper respiratory tract infection, unspecified type - Plan: doxycycline (VIBRA-TABS) 100 MG tablet  Cough - Plan: albuterol (VENTOLIN HFA) 108 (90 Base) MCG/ACT inhaler, HYDROcodone-homatropine (HYCODAN) 5-1.5 MG/5ML syrup  Virtual visit today to discuss current illness.  Patient has noticed upper respiratory infection symptoms for about 1 week, now seems to be developing to cough and wheezing Prescribe doxycycline, albuterol inhaler hydrocodone cough syrup.  Cautioned her that cough syrup may cause sedation, do not use when driving. I encouraged her to get a COVID-19 test as well, especially because she plans to attend a wedding this weekend.  She would be less likely to have COVID-19 again but should be ruled out  I asked her to let me know if not feeling better in the next several days, sooner if worse.  She agrees Video used for entirety of  visit today    Signed Lamar Blinks, MD

## 2020-03-25 DIAGNOSIS — Z20822 Contact with and (suspected) exposure to covid-19: Secondary | ICD-10-CM | POA: Diagnosis not present

## 2020-03-25 DIAGNOSIS — J019 Acute sinusitis, unspecified: Secondary | ICD-10-CM | POA: Diagnosis not present

## 2020-03-27 ENCOUNTER — Encounter: Payer: Self-pay | Admitting: Family Medicine

## 2020-04-08 ENCOUNTER — Encounter: Payer: Self-pay | Admitting: Dermatology

## 2020-04-08 ENCOUNTER — Other Ambulatory Visit: Payer: Self-pay

## 2020-04-08 ENCOUNTER — Ambulatory Visit (INDEPENDENT_AMBULATORY_CARE_PROVIDER_SITE_OTHER): Payer: Medicare Other | Admitting: Dermatology

## 2020-04-08 DIAGNOSIS — D485 Neoplasm of uncertain behavior of skin: Secondary | ICD-10-CM

## 2020-04-08 DIAGNOSIS — L820 Inflamed seborrheic keratosis: Secondary | ICD-10-CM | POA: Diagnosis not present

## 2020-04-08 DIAGNOSIS — Z1283 Encounter for screening for malignant neoplasm of skin: Secondary | ICD-10-CM | POA: Diagnosis not present

## 2020-04-08 NOTE — Patient Instructions (Signed)

## 2020-04-30 ENCOUNTER — Ambulatory Visit: Payer: Medicare Other | Admitting: Physician Assistant

## 2020-05-07 NOTE — Progress Notes (Signed)
   New Patient   Subjective  Peggy Santiago is a 69 y.o. female who presents for the following: Annual Exam (RIGHT INNER EYE X 2 MONTHS , RIGHT BACK CYST).  Annual skin exam Location:  Duration:  Quality:  Associated Signs/Symptoms: Modifying Factors:  Severity:  Timing: Context:    The following portions of the chart were reviewed this encounter and updated as appropriate: Tobacco  Allergies  Meds  Problems  Med Hx  Surg Hx  Fam Hx      Objective  Well appearing patient in no apparent distress; mood and affect are within normal limits.  A full examination was performed including scalp, head, eyes, ears, nose, lips, neck, chest, axillae, abdomen, back, buttocks, bilateral upper extremities, bilateral lower extremities, hands, feet, fingers, toes, fingernails, and toenails. All findings within normal limits unless otherwise noted below.   Assessment & Plan  Neoplasm of uncertain behavior of skin Right Malar Cheek  Skin / nail biopsy Type of biopsy: tangential   Informed consent: discussed and consent obtained   Timeout: patient name, date of birth, surgical site, and procedure verified   Anesthesia: the lesion was anesthetized in a standard fashion   Anesthetic:  1% lidocaine w/ epinephrine 1-100,000 local infiltration Instrument used: flexible razor blade   Hemostasis achieved with: ferric subsulfate   Outcome: patient tolerated procedure well   Post-procedure details: sterile dressing applied and wound care instructions given   Dressing type: bandage and petrolatum    Specimen 1 - Surgical pathology Differential Diagnosis: BCC SCC Check Margins: No  Encounter for screening for malignant neoplasm of skin Mid Back  Yearly skin exams.

## 2020-05-12 ENCOUNTER — Encounter: Payer: Self-pay | Admitting: Dermatology

## 2020-05-20 ENCOUNTER — Other Ambulatory Visit: Payer: Self-pay | Admitting: Family Medicine

## 2020-05-20 DIAGNOSIS — M1712 Unilateral primary osteoarthritis, left knee: Secondary | ICD-10-CM

## 2020-07-24 ENCOUNTER — Ambulatory Visit: Payer: Medicare Other | Admitting: Family Medicine

## 2020-08-07 ENCOUNTER — Ambulatory Visit: Payer: Medicare Other | Admitting: Family Medicine

## 2020-08-15 ENCOUNTER — Other Ambulatory Visit: Payer: Self-pay | Admitting: Family Medicine

## 2020-08-15 DIAGNOSIS — D510 Vitamin B12 deficiency anemia due to intrinsic factor deficiency: Secondary | ICD-10-CM

## 2020-08-19 NOTE — Progress Notes (Addendum)
[facility] [place], [place], [place] [address] [phone]  Date:  08/21/2020   Name:  Peggy Santiago   DOB:  1951/03/20   MRN:  [id number]  PCP:  Darreld Mclean, MD    Chief Complaint: Annual Exam and Leg Cramps   History of Present Illness:  Peggy Santiago is a 70 y.o. very pleasant female patient who presents with the following:  Here today for a medicare follow-up visit Last seen by myself a year ago not counting a video visit in September  History of osteopenia, dyslipidemia, diabetes, migraine headache, cervical spine disease She is overall doing well since her last visit She does have leg cramps- they bother her mostly at night  They may really disturb her sleep and can give her pain for days  It has gotten worse the last 2-3 months She tried some OTC leg cramp medication which does seem to help  She does have some urge to move her legs at night -wonders if she may have restless legs Just occasional cramps during the day She has tried various home remedies which have not been that helpful  COVID-19 series- not done yet  Eye exam; she went in July A1c- today  Flu vaccine- today  Foot exam is due Urine microalbumin due Cologuard will be due in April, can order for patient.  However pt notes she did a colonoscopy with Dr Earlean Shawl 1-2 years ago.  We will ask for this record Pneumonia series is complete Can mention Shingrix- she thinks this was done at [facility] already Most recent labs on chart from about 1 year ago mammo is due- she will plan to do this asap  Patient Active Problem List   Diagnosis Date Noted  . Osteopenia 07/02/2017  . Dyslipidemia 06/15/2016  . Pernicious anemia 06/10/2015  . Allergic rhinitis 06/10/2015  . IBS (irritable bowel syndrome) 06/10/2015  . Nephrolithiasis 06/10/2015  . Migraine 06/10/2015  . Cervical spondylosis without myelopathy 06/10/2015  .  DDD (degenerative disc disease), cervical 06/10/2015  . S/P total knee arthroplasty 03/25/2014  . Arthritis of knee, degenerative 02/26/2014  . Type II or unspecified type diabetes mellitus without mention of complication, not stated as uncontrolled 02/23/2014  . Carpal tunnel syndrome 02/02/2013    Past Medical History:  Diagnosis Date  . Allergy   . Anemia   . Chronic kidney disease    kidney stones  . Diabetes mellitus without complication (HCC)    borderline  . Osteoarthritis of knee   . PONV (postoperative nausea and vomiting)     Past Surgical History:  Procedure Laterality Date  . ABDOMINAL HYSTERECTOMY     Partial; one ovary remains  . ANKLE SURGERY     with plate  . APPENDECTOMY    . CHOLECYSTECTOMY    . colon resection due to SBO    . EYE SURGERY    . JOINT REPLACEMENT     2 scopes rt knee  . TOTAL KNEE ARTHROPLASTY Right 03/25/2014   Procedure: TOTAL KNEE ARTHROPLASTY;  Surgeon: Vickey Huger, MD;  Location: [facility];  Service: Orthopedics;  Laterality: Right;    Social History   Tobacco Use  . Smoking status: Never Smoker  . Smokeless tobacco: Never Used  Vaping Use  . Vaping Use: Never used  Substance Use Topics  . Alcohol use: No    Alcohol/week: 0.0 standard drinks  . Drug use: No  Family History  Problem Relation Age of Onset  . Heart disease Mother   . Rheum arthritis Mother   . Alzheimer's disease Mother   . Heart disease Father   . Diabetes Sister   . Heart disease Sister   . Hyperlipidemia Sister   . Cancer Brother   . Heart disease Brother   . Hyperlipidemia Brother   . Hyperlipidemia Sister   . Heart disease Sister   . Diabetes Sister     Allergies  Allergen Reactions  . Statins     Body aches, has tried several times and does not tolerate well  . Contrast Media [Iodinated Diagnostic Agents] Hives, Rash and Other (See Comments)    ALL OVER THE BODY  . Ioxaglate Rash and Hives    ALL OVER THE BODY    Medication list has been  reviewed and updated.  Current Outpatient Medications on File Prior to Visit  Medication Sig Dispense Refill  . celecoxib (CELEBREX) 200 MG capsule Take 1 capsule by mouth once daily 90 capsule 0  . cyanocobalamin (,VITAMIN B-12,) 1000 MCG/ML injection Inject 1 mL (1,000 mcg total) into the muscle every 30 (thirty) days. 10 mL 1  . Multiple Vitamin (MULTIVITAMIN WITH MINERALS) TABS tablet Take 1 tablet by mouth daily.    Marland Kitchen omeprazole (PRILOSEC) 40 MG capsule Take 1 capsule by mouth once daily 90 capsule 3   No current facility-administered medications on file prior to visit.    Review of Systems:  As per HPI- otherwise negative.   Physical Examination: Vitals:   08/21/20 0823  BP: 118/64  Pulse: 74  Resp: 17  SpO2: 97%   Vitals:   08/21/20 0823  Weight: 186 lb (84.4 kg)  Height: 5' 1.5" (1.562 m)   Body mass index is 34.58 kg/m. Ideal Body Weight: Weight in (lb) to have BMI = 25: 134.2  GEN: no acute distress.  Obese, looks well  HEENT: Atraumatic, Normocephalic.   Bilateral TM wnl, oropharynx normal.  PEERL,EOMI.   Ears and Nose: No external deformity. CV: RRR, No M/G/R. No JVD. No thrill. No extra heart sounds. PULM: CTA B, no wheezes, crackles, rhonchi. No retractions. No resp. distress. No accessory muscle use. ABD: S, NT, ND, +BS. No rebound. No HSM. EXTR: No c/c/e PSYCH: Normally interactive. Conversant.  Foot exam today- normal   Assessment and Plan: Dyslipidemia - Plan: Lipid panel  Controlled type 2 diabetes mellitus without complication, without long-term current use of insulin (HCC) - Plan: Comprehensive metabolic panel, Hemoglobin A1c, Microalbumin / creatinine urine ratio  Screening for thyroid disorder - Plan: TSH  Osteopenia, unspecified location  B12 deficiency - Plan: B12  Screening for deficiency anemia - Plan: CBC  Fatigue, unspecified type - Plan: TSH, VITAMIN D 25 Hydroxy (Vit-D Deficiency, Fractures)  Restless legs - Plan: rOPINIRole  (REQUIP) 0.5 MG tablet  Medication monitoring encounter - Plan: CBC  Vitamin D deficiency - Plan: VITAMIN D 25 Hydroxy (Vit-D Deficiency, Fractures)  Patient today for routine visit.  Labs are pending as above Discussed health maintenance and immunizations Gave flu shot Encouraged her to finish COVID-19 series Recommend mammogram Per patient colon cancer screening is up-to-date, we will request these records We will have her try Requip for her restless legs, she will let me know if this is helpful.  Advised that we can go up on the dose if it seems to be helping Will plan further follow- up pending labs.  This visit occurred during the SARS-CoV-2 public health emergency.  Safety protocols were in place, including screening questions prior to the visit, additional usage of staff PPE, and extensive cleaning of exam room while observing appropriate contact time as indicated for disinfecting solutions.    Signed Lamar Blinks, MD  Received her labs as below, message to pt  Results for orders placed or performed in visit on 08/21/20  CBC  Result Value Ref Range   WBC 5.1 4.0 - 10.5 K/uL   RBC 4.44 3.87 - 5.11 Mil/uL   Platelets 252.0 150.0 - 400.0 K/uL   Hemoglobin 13.4 12.0 - 15.0 g/dL   HCT 40.0 36.0 - 46.0 %   MCV 90.1 78.0 - 100.0 fl   MCHC 33.6 30.0 - 36.0 g/dL   RDW 13.5 11.5 - 15.5 %  Comprehensive metabolic panel  Result Value Ref Range   Sodium 143 135 - 145 mEq/L   Potassium 4.5 3.5 - 5.1 mEq/L   Chloride 105 96 - 112 mEq/L   CO2 29 19 - 32 mEq/L   Glucose, Bld 115 (H) 70 - 99 mg/dL   BUN 22 6 - 23 mg/dL   Creatinine, Ser 0.93 0.40 - 1.20 mg/dL   Total Bilirubin 1.0 0.2 - 1.2 mg/dL   Alkaline Phosphatase 62 39 - 117 U/L   AST 17 0 - 37 U/L   ALT 19 0 - 35 U/L   Total Protein 7.3 6.0 - 8.3 g/dL   Albumin 4.4 3.5 - 5.2 g/dL   GFR 63.26 >60.00 mL/min   Calcium 9.9 8.4 - 10.5 mg/dL  Hemoglobin A1c  Result Value Ref Range   Hgb A1c MFr Bld 6.2 4.6 - 6.5 %   Lipid panel  Result Value Ref Range   Cholesterol 284 (H) 0 - 200 mg/dL   Triglycerides 146.0 0.0 - 149.0 mg/dL   HDL 37.70 (L) >39.00 mg/dL   VLDL 29.2 0.0 - 40.0 mg/dL   LDL Cholesterol 217 (H) 0 - 99 mg/dL   Total CHOL/HDL Ratio 8    NonHDL 246.08   TSH  Result Value Ref Range   TSH 1.31 0.35 - 4.50 uIU/mL  VITAMIN D 25 Hydroxy (Vit-D Deficiency, Fractures)  Result Value Ref Range   VITD 44.41 30.00 - 100.00 ng/mL  Microalbumin / creatinine urine ratio  Result Value Ref Range   Microalb, Ur 1.3 0.0 - 1.9 mg/dL   Creatinine,U 275.2 mg/dL   Microalb Creat Ratio 0.5 0.0 - 30.0 mg/g  B12  Result Value Ref Range   Vitamin B-12 549 211 - 911 pg/mL

## 2020-08-21 ENCOUNTER — Ambulatory Visit (INDEPENDENT_AMBULATORY_CARE_PROVIDER_SITE_OTHER): Payer: Medicare Other | Admitting: Family Medicine

## 2020-08-21 ENCOUNTER — Encounter: Payer: Self-pay | Admitting: Family Medicine

## 2020-08-21 ENCOUNTER — Other Ambulatory Visit (HOSPITAL_BASED_OUTPATIENT_CLINIC_OR_DEPARTMENT_OTHER): Payer: Self-pay | Admitting: Family Medicine

## 2020-08-21 ENCOUNTER — Other Ambulatory Visit: Payer: Self-pay

## 2020-08-21 VITALS — BP 118/64 | HR 74 | Resp 17 | Ht 61.5 in | Wt 186.0 lb

## 2020-08-21 DIAGNOSIS — G2581 Restless legs syndrome: Secondary | ICD-10-CM | POA: Diagnosis not present

## 2020-08-21 DIAGNOSIS — E538 Deficiency of other specified B group vitamins: Secondary | ICD-10-CM

## 2020-08-21 DIAGNOSIS — E119 Type 2 diabetes mellitus without complications: Secondary | ICD-10-CM | POA: Diagnosis not present

## 2020-08-21 DIAGNOSIS — Z130 Encounter for screening for diseases of the blood and blood-forming organs and certain disorders involving the immune mechanism: Secondary | ICD-10-CM | POA: Diagnosis not present

## 2020-08-21 DIAGNOSIS — Z5181 Encounter for therapeutic drug level monitoring: Secondary | ICD-10-CM

## 2020-08-21 DIAGNOSIS — R5383 Other fatigue: Secondary | ICD-10-CM

## 2020-08-21 DIAGNOSIS — E559 Vitamin D deficiency, unspecified: Secondary | ICD-10-CM | POA: Diagnosis not present

## 2020-08-21 DIAGNOSIS — Z1329 Encounter for screening for other suspected endocrine disorder: Secondary | ICD-10-CM

## 2020-08-21 DIAGNOSIS — M8580 Other specified disorders of bone density and structure, unspecified site: Secondary | ICD-10-CM

## 2020-08-21 DIAGNOSIS — E785 Hyperlipidemia, unspecified: Secondary | ICD-10-CM

## 2020-08-21 DIAGNOSIS — Z23 Encounter for immunization: Secondary | ICD-10-CM

## 2020-08-21 DIAGNOSIS — Z1231 Encounter for screening mammogram for malignant neoplasm of breast: Secondary | ICD-10-CM

## 2020-08-21 LAB — CBC
HCT: 40 % (ref 36.0–46.0)
Hemoglobin: 13.4 g/dL (ref 12.0–15.0)
MCHC: 33.6 g/dL (ref 30.0–36.0)
MCV: 90.1 fl (ref 78.0–100.0)
Platelets: 252 10*3/uL (ref 150.0–400.0)
RBC: 4.44 Mil/uL (ref 3.87–5.11)
RDW: 13.5 % (ref 11.5–15.5)
WBC: 5.1 10*3/uL (ref 4.0–10.5)

## 2020-08-21 LAB — LIPID PANEL
Cholesterol: 284 mg/dL — ABNORMAL HIGH (ref 0–200)
HDL: 37.7 mg/dL — ABNORMAL LOW (ref >39.00)
LDL Cholesterol: 217 mg/dL — ABNORMAL HIGH (ref 0–99)
NonHDL: 246.08
Total CHOL/HDL Ratio: 8
Triglycerides: 146 mg/dL (ref 0.0–149.0)
VLDL: 29.2 mg/dL (ref 0.0–40.0)

## 2020-08-21 LAB — COMPREHENSIVE METABOLIC PANEL
ALT: 19 U/L (ref 0–35)
AST: 17 U/L (ref 0–37)
Albumin: 4.4 g/dL (ref 3.5–5.2)
Alkaline Phosphatase: 62 U/L (ref 39–117)
BUN: 22 mg/dL (ref 6–23)
CO2: 29 mEq/L (ref 19–32)
Calcium: 9.9 mg/dL (ref 8.4–10.5)
Chloride: 105 mEq/L (ref 96–112)
Creatinine, Ser: 0.93 mg/dL (ref 0.40–1.20)
GFR: 63.26 mL/min (ref >60.00)
Glucose, Bld: 115 mg/dL — ABNORMAL HIGH (ref 70–99)
Potassium: 4.5 mEq/L (ref 3.5–5.1)
Sodium: 143 mEq/L (ref 135–145)
Total Bilirubin: 1 mg/dL (ref 0.2–1.2)
Total Protein: 7.3 g/dL (ref 6.0–8.3)

## 2020-08-21 LAB — MICROALBUMIN / CREATININE URINE RATIO
Creatinine,U: 275.2 mg/dL
Microalb Creat Ratio: 0.5 mg/g (ref 0.0–30.0)
Microalb, Ur: 1.3 mg/dL (ref 0.0–1.9)

## 2020-08-21 LAB — HEMOGLOBIN A1C: Hgb A1c MFr Bld: 6.2 % (ref 4.6–6.5)

## 2020-08-21 LAB — TSH: TSH: 1.31 u[IU]/mL (ref 0.35–4.50)

## 2020-08-21 LAB — VITAMIN D 25 HYDROXY (VIT D DEFICIENCY, FRACTURES): VITD: 44.41 ng/mL (ref 30.00–100.00)

## 2020-08-21 LAB — VITAMIN B12: Vitamin B-12: 549 pg/mL (ref 211–911)

## 2020-08-21 MED ORDER — ROPINIROLE HCL 0.5 MG PO TABS: 0.5000 mg | ORAL_TABLET | Freq: Every day | ORAL | 3 refills | Status: DC

## 2020-08-21 NOTE — Patient Instructions (Signed)
It was good to see you again today-  I will be in touch with your labs Try the requip for your restless legs- take a 1/2 tablet at bedtime for 2 days, then increase to a whole tablet.  We can go up on the dose quite a bit- let me know how it seems to be working for you Please do schedule a mammogram asap!  I will request your colonoscopy reports from Dr Earlean Shawl Flu vaccine today Please get your covid 2nd dose!     Health Maintenance After Age 69 After age 28, you are at a higher risk for certain long-term diseases and infections as well as injuries from falls. Falls are a major cause of broken bones and head injuries in people who are older than age 56. Getting regular preventive care can help to keep you healthy and well. Preventive care includes getting regular testing and making lifestyle changes as recommended by your health care provider. Talk with your health care provider about:  Which screenings and tests you should have. A screening is a test that checks for a disease when you have no symptoms.  A diet and exercise plan that is right for you. What should I know about screenings and tests to prevent falls? Screening and testing are the best ways to find a health problem early. Early diagnosis and treatment give you the best chance of managing medical conditions that are common after age 81. Certain conditions and lifestyle choices may make you more likely to have a fall. Your health care provider may recommend:  Regular vision checks. Poor vision and conditions such as cataracts can make you more likely to have a fall. If you wear glasses, make sure to get your prescription updated if your vision changes.  Medicine review. Work with your health care provider to regularly review all of the medicines you are taking, including over-the-counter medicines. Ask your health care provider about any side effects that may make you more likely to have a fall. Tell your health care provider if any  medicines that you take make you feel dizzy or sleepy.  Osteoporosis screening. Osteoporosis is a condition that causes the bones to get weaker. This can make the bones weak and cause them to break more easily.  Blood pressure screening. Blood pressure changes and medicines to control blood pressure can make you feel dizzy.  Strength and balance checks. Your health care provider may recommend certain tests to check your strength and balance while standing, walking, or changing positions.  Foot health exam. Foot pain and numbness, as well as not wearing proper footwear, can make you more likely to have a fall.  Depression screening. You may be more likely to have a fall if you have a fear of falling, feel emotionally low, or feel unable to do activities that you used to do.  Alcohol use screening. Using too much alcohol can affect your balance and may make you more likely to have a fall. What actions can I take to lower my risk of falls? General instructions  Talk with your health care provider about your risks for falling. Tell your health care provider if: ? You fall. Be sure to tell your health care provider about all falls, even ones that seem minor. ? You feel dizzy, sleepy, or off-balance.  Take over-the-counter and prescription medicines only as told by your health care provider. These include any supplements.  Eat a healthy diet and maintain a healthy weight. A healthy diet includes  low-fat dairy products, low-fat (lean) meats, and fiber from whole grains, beans, and lots of fruits and vegetables. Home safety  Remove any tripping hazards, such as rugs, cords, and clutter.  Install safety equipment such as grab bars in bathrooms and safety rails on stairs.  Keep rooms and walkways well-lit. Activity  Follow a regular exercise program to stay fit. This will help you maintain your balance. Ask your health care provider what types of exercise are appropriate for you.  If you need  a cane or walker, use it as recommended by your health care provider.  Wear supportive shoes that have nonskid soles.   Lifestyle  Do not drink alcohol if your health care provider tells you not to drink.  If you drink alcohol, limit how much you have: ? 0-1 drink a day for women. ? 0-2 drinks a day for men.  Be aware of how much alcohol is in your drink. In the [place] one drink equals one typical bottle of beer (12 oz), one-half glass of wine (5 oz), or one shot of hard liquor (1 oz).  Do not use any products that contain nicotine or tobacco, such as cigarettes and e-cigarettes. If you need help quitting, ask your health care provider. Summary  Having a healthy lifestyle and getting preventive care can help to protect your health and wellness after age 95.  Screening and testing are the best way to find a health problem early and help you avoid having a fall. Early diagnosis and treatment give you the best chance for managing medical conditions that are more common for people who are older than age 56.  Falls are a major cause of broken bones and head injuries in people who are older than age 79. Take precautions to prevent a fall at home.  Work with your health care provider to learn what changes you can make to improve your health and wellness and to prevent falls. This information is not intended to replace advice given to you by your health care provider. Make sure you discuss any questions you have with your health care provider. Document Revised: 10/19/2018 Document Reviewed: 05/11/2017 Elsevier Patient Education  2021 [employer].

## 2020-08-22 ENCOUNTER — Telehealth: Payer: Self-pay

## 2020-08-22 ENCOUNTER — Other Ambulatory Visit: Payer: Self-pay | Admitting: Family Medicine

## 2020-08-22 MED ORDER — ROSUVASTATIN CALCIUM 5 MG PO TABS: 5.0000 mg | ORAL_TABLET | Freq: Every day | ORAL | 3 refills | Status: DC

## 2020-08-22 MED ORDER — REPATHA 140 MG/ML ~~LOC~~ SOSY: PREFILLED_SYRINGE | SUBCUTANEOUS | 3 refills | Status: DC

## 2020-08-22 NOTE — Telephone Encounter (Signed)
Repatha no longer covered. Preferred alternative is Praluent

## 2020-08-25 NOTE — Telephone Encounter (Signed)
See mychart messages between provider and Pt.

## 2020-09-02 ENCOUNTER — Other Ambulatory Visit: Payer: Self-pay

## 2020-09-02 ENCOUNTER — Ambulatory Visit (HOSPITAL_BASED_OUTPATIENT_CLINIC_OR_DEPARTMENT_OTHER)
Admission: RE | Admit: 2020-09-02 | Discharge: 2020-09-02 | Disposition: A | Discharge status: Home / Self Care | Payer: Medicare Other | Source: Ambulatory Visit | Attending: Family Medicine | Admitting: Family Medicine

## 2020-09-02 ENCOUNTER — Encounter (HOSPITAL_BASED_OUTPATIENT_CLINIC_OR_DEPARTMENT_OTHER): Payer: Self-pay

## 2020-09-02 DIAGNOSIS — Z1231 Encounter for screening mammogram for malignant neoplasm of breast: Secondary | ICD-10-CM | POA: Insufficient documentation

## 2020-09-05 ENCOUNTER — Other Ambulatory Visit: Payer: Self-pay | Admitting: Family Medicine

## 2020-09-05 DIAGNOSIS — M1712 Unilateral primary osteoarthritis, left knee: Secondary | ICD-10-CM

## 2020-09-29 ENCOUNTER — Ambulatory Visit (INDEPENDENT_AMBULATORY_CARE_PROVIDER_SITE_OTHER): Payer: Medicare Other | Admitting: Dermatology

## 2020-09-29 ENCOUNTER — Encounter: Payer: Self-pay | Admitting: Dermatology

## 2020-09-29 ENCOUNTER — Other Ambulatory Visit: Payer: Self-pay

## 2020-09-29 DIAGNOSIS — D485 Neoplasm of uncertain behavior of skin: Secondary | ICD-10-CM

## 2020-09-29 DIAGNOSIS — L820 Inflamed seborrheic keratosis: Secondary | ICD-10-CM

## 2020-09-29 NOTE — Patient Instructions (Signed)

## 2020-10-07 ENCOUNTER — Encounter: Payer: Self-pay | Admitting: Dermatology

## 2020-10-07 NOTE — Progress Notes (Signed)
   Follow-Up Visit   Subjective  Peggy Santiago is a 71 y.o. female who presents for the following: Skin Problem (Patient wants dark spot on left side of face taken off.).  Crust left cheek has historically grown Location:  Duration:  Quality:  Associated Signs/Symptoms: Modifying Factors:  Severity:  Timing: Context: Patient prefers biopsy  Objective  Well appearing patient in no apparent distress; mood and affect are within normal limits. Objective  Left Malar Cheek: Pink-brown inflamed crust, favor I SK over SCCA.       A focused examination was performed including Head and neck.. Relevant physical exam findings are noted in the Assessment and Plan.   Assessment & Plan    Neoplasm of uncertain behavior of skin Left Malar Cheek  Skin / nail biopsy Type of biopsy: tangential   Informed consent: discussed and consent obtained   Timeout: patient name, date of birth, surgical site, and procedure verified   Procedure prep:  Patient was prepped and draped in usual sterile fashion (Non sterile) Prep type:  Chlorhexidine Anesthesia: the lesion was anesthetized in a standard fashion   Anesthetic:  1% lidocaine w/ epinephrine 1-100,000 local infiltration Instrument used: flexible razor blade   Outcome: patient tolerated procedure well   Post-procedure details: wound care instructions given    Specimen 1 - Surgical pathology Differential Diagnosis: r/o atypia  Check Margins: No      I, Lavonna Monarch, MD, have reviewed all documentation for this visit.  The documentation on 10/07/20 for the exam, diagnosis, procedures, and orders are all accurate and complete.

## 2020-10-08 NOTE — Progress Notes (Signed)
[facility] [place], [place], [place] [address] [phone]  Date:  10/09/2020   Name:  Peggy Santiago   DOB:  1951/05/21   MRN:  [id number]  PCP:  Darreld Mclean, MD    Chief Complaint: Hip Pain (Left hip pain, pain has worsened with taking rosuvastatin-has stopped taking)   History of Present Illness:  Peggy Santiago is a 70 y.o. very pleasant female patient who presents with the following:  Pt here today with concern of pain in her left hip Last seen by myself last month for routine visit  History of osteopenia, dyslipidemia, diabetes, migraine headache, cervical spine disease  We did have her try requip for RLS at last visit   She is having left posterior "hip" pain- present for about 2 months but got much worse She feels like crestor made her worse   She stopped crestor again just a few days ago- no change yet NKI It hurts her to walk on the treadmill  Pain does not go down her leg   Lab Results  Component Value Date   HGBA1C 6.2 08/21/2020    Patient Active Problem List   Diagnosis Date Noted  . Osteopenia 07/02/2017  . Dyslipidemia 06/15/2016  . Pernicious anemia 06/10/2015  . Allergic rhinitis 06/10/2015  . IBS (irritable bowel syndrome) 06/10/2015  . Nephrolithiasis 06/10/2015  . Migraine 06/10/2015  . Cervical spondylosis without myelopathy 06/10/2015  . DDD (degenerative disc disease), cervical 06/10/2015  . S/P total knee arthroplasty 03/25/2014  . Arthritis of knee, degenerative 02/26/2014  . Type II or unspecified type diabetes mellitus without mention of complication, not stated as uncontrolled 02/23/2014  . Carpal tunnel syndrome 02/02/2013    Past Medical History:  Diagnosis Date  . Allergy   . Anemia   . Chronic kidney disease    kidney stones  . Diabetes mellitus without complication (HCC)    borderline  . Osteoarthritis of knee   . PONV (postoperative nausea  and vomiting)     Past Surgical History:  Procedure Laterality Date  . ABDOMINAL HYSTERECTOMY     Partial; one ovary remains  . ANKLE SURGERY     with plate  . APPENDECTOMY    . CHOLECYSTECTOMY    . colon resection due to SBO    . EYE SURGERY    . JOINT REPLACEMENT     2 scopes rt knee  . TOTAL KNEE ARTHROPLASTY Right 03/25/2014   Procedure: TOTAL KNEE ARTHROPLASTY;  Surgeon: Vickey Huger, MD;  Location: [facility];  Service: Orthopedics;  Laterality: Right;    Social History   Tobacco Use  . Smoking status: Never Smoker  . Smokeless tobacco: Never Used  Vaping Use  . Vaping Use: Never used  Substance Use Topics  . Alcohol use: No    Alcohol/week: 0.0 standard drinks  . Drug use: No    Family History  Problem Relation Age of Onset  . Heart disease Mother   . Rheum arthritis Mother   . Alzheimer's disease Mother   . Heart disease Father   . Diabetes Sister   . Heart disease Sister   . Hyperlipidemia Sister   . Cancer Brother   . Heart disease Brother   . Hyperlipidemia Brother   . Hyperlipidemia Sister   . Heart disease Sister   . Diabetes Sister     Allergies  Allergen Reactions  . Statins  Body aches, has tried several times and does not tolerate well  . Contrast Media [Iodinated Diagnostic Agents] Hives, Rash and Other (See Comments)    ALL OVER THE BODY  . Ioxaglate Rash and Hives    ALL OVER THE BODY    Medication list has been reviewed and updated.  Current Outpatient Medications on File Prior to Visit  Medication Sig Dispense Refill  . celecoxib (CELEBREX) 200 MG capsule Take 1 capsule (200 mg total) by mouth daily. 90 capsule 0  . cyanocobalamin (,VITAMIN B-12,) 1000 MCG/ML injection Inject 1 mL (1,000 mcg total) into the muscle every 30 (thirty) days. 10 mL 1  . Multiple Vitamin (MULTIVITAMIN WITH MINERALS) TABS tablet Take 1 tablet by mouth daily.    Marland Kitchen omeprazole (PRILOSEC) 40 MG capsule Take 1 capsule by mouth once daily 90 capsule 3   No  current facility-administered medications on file prior to visit.    Review of Systems:  As per HPI- otherwise negative.   Physical Examination: Vitals:   10/09/20 1333  BP: 126/82  Pulse: 71  Resp: 17  SpO2: 98%   Vitals:   10/09/20 1333  Weight: 189 lb (85.7 kg)  Height: 5' 1.5" (1.562 m)   Body mass index is 35.13 kg/m. Ideal Body Weight: Weight in (lb) to have BMI = 25: 134.2  GEN: no acute distress.  Obese, otherwise looks well HEENT: Atraumatic, Normocephalic.  Ears and Nose: No external deformity. CV: RRR, No M/G/R. No JVD. No thrill. No extra heart sounds. PULM: CTA B, no wheezes, crackles, rhonchi. No retractions. No resp. distress. No accessory muscle use. ABD: S, NT, ND EXTR: No c/c/e PSYCH: Normally interactive. Conversant.  Left hip tenderness over greater trochanter  No pain in the groin.  She has some discomfort over the greater trochanter with internal and external rotation of the hip, no pain with flexed abduction or abduction  Discussed R/B/A of greater trochanteric bursitis injection.  She would like to proceed and gives verbal consent for same Prepared 45ml of 1% lido and 40mg  depomedrol.  Prepped skin with Betadine and pads.  Injected lidocaine and Depo-Medrol into left GTB Patient tolerated well with no immediate complications.  No blood loss.  Patient noted improvement of pain right away Assessment and Plan: Left hip pain - Plan: methylPREDNISolone acetate (DEPO-MEDROL) injection 40 mg  Trochanteric bursitis of left hip - Plan: methylPREDNISolone acetate (DEPO-MEDROL) injection 40 mg  Dyslipidemia - Plan: Alirocumab (PRALUENT) 75 MG/ML SOAJ Here today with greater trochanteric bursitis.  Treated with injection as above for same.  Advised patient about aftercare, she will contact me right away if any worsening of pain or evidence of infection  She has stopped taking Crestor due to possible side effects/ aches.  We have looked at [place] for her but  it was quite expensive. I will try Praluent for her and see if cost is any better  This visit occurred during the SARS-CoV-2 public health emergency.  Safety protocols were in place, including screening questions prior to the visit, additional usage of staff PPE, and extensive cleaning of exam room while observing appropriate contact time as indicated for disinfecting solutions.    Signed Lamar Blinks, MD

## 2020-10-09 ENCOUNTER — Other Ambulatory Visit: Payer: Self-pay

## 2020-10-09 ENCOUNTER — Encounter: Payer: Self-pay | Admitting: Family Medicine

## 2020-10-09 ENCOUNTER — Ambulatory Visit (INDEPENDENT_AMBULATORY_CARE_PROVIDER_SITE_OTHER): Payer: Medicare Other | Admitting: Family Medicine

## 2020-10-09 VITALS — BP 126/82 | HR 71 | Resp 17 | Ht 61.5 in | Wt 189.0 lb

## 2020-10-09 DIAGNOSIS — M25552 Pain in left hip: Secondary | ICD-10-CM | POA: Diagnosis not present

## 2020-10-09 DIAGNOSIS — E785 Hyperlipidemia, unspecified: Secondary | ICD-10-CM | POA: Diagnosis not present

## 2020-10-09 DIAGNOSIS — M7062 Trochanteric bursitis, left hip: Secondary | ICD-10-CM | POA: Diagnosis not present

## 2020-10-09 MED ORDER — METHYLPREDNISOLONE ACETATE 40 MG/ML IJ SUSP
40.0000 mg | Freq: Once | INTRAMUSCULAR | Status: AC
  Administered 2020-10-09: 40 mg via INTRA_ARTICULAR

## 2020-10-09 MED ORDER — PRALUENT 75 MG/ML ~~LOC~~ SOAJ: 75.0000 mg | SUBCUTANEOUS | 5 refills | Status: DC

## 2020-10-09 NOTE — Patient Instructions (Signed)
We did an injection into your left hip bursa- trochanteric bursitis treatment.  Hopefully this will relieve your pain- please let me know how you do!  If any sign of infection such as redness, heat or pus please contact me right away Please let me know if your pain is not significantly better over the next few days  Take care

## 2020-10-16 ENCOUNTER — Telehealth: Payer: Self-pay

## 2020-10-16 NOTE — Telephone Encounter (Signed)
Prior authorization for patient's Praluent 75 mg/mL auto injectors has been initiated via cover my meds.   [id number]  Waiting on determination.

## 2020-10-16 NOTE — Telephone Encounter (Signed)
Medication has been approved. 10/10/2020-10/16/2021. Approval letter faxed to pharmacy and sent to scan.

## 2020-11-03 ENCOUNTER — Other Ambulatory Visit: Payer: Self-pay | Admitting: Family Medicine

## 2020-11-03 DIAGNOSIS — M1712 Unilateral primary osteoarthritis, left knee: Secondary | ICD-10-CM

## 2020-11-12 ENCOUNTER — Encounter: Payer: Self-pay | Admitting: Family Medicine

## 2020-11-12 DIAGNOSIS — M25551 Pain in right hip: Secondary | ICD-10-CM

## 2020-11-12 NOTE — Addendum Note (Signed)
Addended by: Lamar Blinks C on: 11/12/2020 08:28 PM   Modules accepted: Orders

## 2020-11-14 ENCOUNTER — Encounter: Payer: Self-pay | Admitting: Family Medicine

## 2020-11-14 ENCOUNTER — Other Ambulatory Visit: Payer: Self-pay

## 2020-11-14 ENCOUNTER — Ambulatory Visit
Admission: RE | Admit: 2020-11-14 | Discharge: 2020-11-14 | Disposition: A | Discharge status: Home / Self Care | Payer: Medicare Other | Source: Ambulatory Visit | Attending: Family Medicine | Admitting: Family Medicine

## 2020-11-14 DIAGNOSIS — M25551 Pain in right hip: Secondary | ICD-10-CM | POA: Diagnosis not present

## 2020-11-14 DIAGNOSIS — M25552 Pain in left hip: Secondary | ICD-10-CM | POA: Diagnosis not present

## 2020-12-01 ENCOUNTER — Encounter: Payer: Self-pay | Admitting: Orthopaedic Surgery

## 2020-12-01 ENCOUNTER — Other Ambulatory Visit: Payer: Self-pay

## 2020-12-01 ENCOUNTER — Ambulatory Visit (INDEPENDENT_AMBULATORY_CARE_PROVIDER_SITE_OTHER): Payer: Medicare Other | Admitting: Orthopaedic Surgery

## 2020-12-01 VITALS — Ht 61.5 in | Wt 189.0 lb

## 2020-12-01 DIAGNOSIS — M7061 Trochanteric bursitis, right hip: Secondary | ICD-10-CM | POA: Diagnosis not present

## 2020-12-01 DIAGNOSIS — M7062 Trochanteric bursitis, left hip: Secondary | ICD-10-CM | POA: Diagnosis not present

## 2020-12-01 NOTE — Progress Notes (Signed)
Office Visit Note   Patient: Peggy Santiago           Date of Birth: 08-05-1950           MRN: [id number] Visit Date: 12/01/2020              Requested by: Darreld Mclean, MD [address] [place],  [place] [address] PCP: Darreld Mclean, MD   Assessment & Plan: Visit Diagnoses:  1. Trochanteric bursitis, left hip   2. Trochanteric bursitis, right hip     Plan: Her signs and symptoms are classic for trochanteric bursitis.  I think her chronic right knee issues contributes to how she walks different and this is chronically affecting her left hip.  We will least try a course of physical therapy for both hips to see if any modalities can be provided that help this calm down.  All questions and concerns were answered and addressed.  I will see her back in 6 weeks after course of physical therapy.  Follow-Up Instructions: Return in about 6 weeks (around 01/12/2021).   Orders:  No orders of the defined types were placed in this encounter.  No orders of the defined types were placed in this encounter.     Procedures: No procedures performed   Clinical Data: No additional findings.   Subjective: Chief Complaint  Patient presents with  . Left Hip - Pain  . Right Hip - Pain  The patient comes in today with bilateral hip pain with left worse than right has been going on for a long period of time.  She cannot lay on her left hip at night.  She did have a steroid injection around the trochanteric area by her primary care physician in March of this year.  She is a prediabetic.  She denies any groin pain bilaterally.  She does have a history of a right total knee arthroplasty that was done by one of my colleagues in town.  She has never been able to fully straighten her knee and it is throwing off her gait and that is likely contributed to her left hip pain because of the way she walks.  She denies any specific injuries.  She denies any other acute change in her  medical status.  HPI  Review of Systems There is currently listed no headache, chest pain, shortness of breath, fever, chills, nausea, vomiting  Objective: Vital Signs: Ht 5' 1.5" (1.562 m)   Wt 189 lb (85.7 kg)   BMI 35.13 kg/m   Physical Exam She is alert and orient x3 and in no acute distress Ortho Exam Examination of both hips shows the move smoothly and fluidly with no pain in the groin and no blocks to rotation.  Both hips have significant pain to palpation of the trochanteric area and the proximal IT band.  Her right knee shows a well-healed surgical incision.  She lacks full extension by least 3 to 5 degrees.  Her flexion is good.  This does throw off her gait when I see her walk. Specialty Comments:  No specialty comments available.  Imaging: No results found. An AP pelvis and lateral both hips were reviewed and show no acute findings.  The hip joint space is well-maintained on both hips.  PMFS History: Patient Active Problem List   Diagnosis Date Noted  . Osteopenia 07/02/2017  . Dyslipidemia 06/15/2016  . Pernicious anemia 06/10/2015  . Allergic rhinitis 06/10/2015  . IBS (irritable bowel syndrome)  06/10/2015  . Nephrolithiasis 06/10/2015  . Migraine 06/10/2015  . Cervical spondylosis without myelopathy 06/10/2015  . DDD (degenerative disc disease), cervical 06/10/2015  . S/P total knee arthroplasty 03/25/2014  . Arthritis of knee, degenerative 02/26/2014  . Type II or unspecified type diabetes mellitus without mention of complication, not stated as uncontrolled 02/23/2014  . Carpal tunnel syndrome 02/02/2013   Past Medical History:  Diagnosis Date  . Allergy   . Anemia   . Chronic kidney disease    kidney stones  . Diabetes mellitus without complication (HCC)    borderline  . Osteoarthritis of knee   . PONV (postoperative nausea and vomiting)     Family History  Problem Relation Age of Onset  . Heart disease Mother   . Rheum arthritis Mother   .  Alzheimer's disease Mother   . Heart disease Father   . Diabetes Sister   . Heart disease Sister   . Hyperlipidemia Sister   . Cancer Brother   . Heart disease Brother   . Hyperlipidemia Brother   . Hyperlipidemia Sister   . Heart disease Sister   . Diabetes Sister     Past Surgical History:  Procedure Laterality Date  . ABDOMINAL HYSTERECTOMY     Partial; one ovary remains  . ANKLE SURGERY     with plate  . APPENDECTOMY    . CHOLECYSTECTOMY    . colon resection due to SBO    . EYE SURGERY    . JOINT REPLACEMENT     2 scopes rt knee  . TOTAL KNEE ARTHROPLASTY Right 03/25/2014   Procedure: TOTAL KNEE ARTHROPLASTY;  Surgeon: Vickey Huger, MD;  Location: [facility];  Service: Orthopedics;  Laterality: Right;   Social History   Occupational History  . Occupation: Health visitor  Tobacco Use  . Smoking status: Never Smoker  . Smokeless tobacco: Never Used  Vaping Use  . Vaping Use: Never used  Substance and Sexual Activity  . Alcohol use: No    Alcohol/week: 0.0 standard drinks  . Drug use: No  . Sexual activity: Yes    Birth control/protection: Surgical

## 2020-12-02 ENCOUNTER — Other Ambulatory Visit: Payer: Self-pay

## 2020-12-02 ENCOUNTER — Other Ambulatory Visit: Payer: Self-pay | Admitting: Orthopaedic Surgery

## 2020-12-02 ENCOUNTER — Telehealth: Payer: Self-pay

## 2020-12-02 DIAGNOSIS — M7061 Trochanteric bursitis, right hip: Secondary | ICD-10-CM

## 2020-12-02 DIAGNOSIS — M7062 Trochanteric bursitis, left hip: Secondary | ICD-10-CM

## 2020-12-02 MED ORDER — METHYLPREDNISOLONE 4 MG PO TABS: ORAL_TABLET | ORAL | 0 refills | Status: DC

## 2020-12-02 NOTE — Telephone Encounter (Signed)
[facility] called and requested therapy rx and last note to be faxed to them at [phone]   This was faxed

## 2020-12-03 ENCOUNTER — Encounter: Payer: Self-pay | Admitting: Family Medicine

## 2020-12-03 ENCOUNTER — Other Ambulatory Visit: Payer: Self-pay | Admitting: Family Medicine

## 2020-12-10 DIAGNOSIS — M7062 Trochanteric bursitis, left hip: Secondary | ICD-10-CM | POA: Diagnosis not present

## 2020-12-10 DIAGNOSIS — M25552 Pain in left hip: Secondary | ICD-10-CM | POA: Diagnosis not present

## 2020-12-10 DIAGNOSIS — M25551 Pain in right hip: Secondary | ICD-10-CM | POA: Diagnosis not present

## 2020-12-10 DIAGNOSIS — M7061 Trochanteric bursitis, right hip: Secondary | ICD-10-CM | POA: Diagnosis not present

## 2020-12-16 DIAGNOSIS — M25551 Pain in right hip: Secondary | ICD-10-CM | POA: Diagnosis not present

## 2020-12-16 DIAGNOSIS — M25552 Pain in left hip: Secondary | ICD-10-CM | POA: Diagnosis not present

## 2020-12-16 DIAGNOSIS — M7061 Trochanteric bursitis, right hip: Secondary | ICD-10-CM | POA: Diagnosis not present

## 2020-12-16 DIAGNOSIS — M7062 Trochanteric bursitis, left hip: Secondary | ICD-10-CM | POA: Diagnosis not present

## 2020-12-18 DIAGNOSIS — M7062 Trochanteric bursitis, left hip: Secondary | ICD-10-CM | POA: Diagnosis not present

## 2020-12-18 DIAGNOSIS — M7061 Trochanteric bursitis, right hip: Secondary | ICD-10-CM | POA: Diagnosis not present

## 2020-12-18 DIAGNOSIS — M25551 Pain in right hip: Secondary | ICD-10-CM | POA: Diagnosis not present

## 2020-12-18 DIAGNOSIS — M25552 Pain in left hip: Secondary | ICD-10-CM | POA: Diagnosis not present

## 2020-12-23 DIAGNOSIS — M25551 Pain in right hip: Secondary | ICD-10-CM | POA: Diagnosis not present

## 2020-12-23 DIAGNOSIS — M7062 Trochanteric bursitis, left hip: Secondary | ICD-10-CM | POA: Diagnosis not present

## 2020-12-23 DIAGNOSIS — M7061 Trochanteric bursitis, right hip: Secondary | ICD-10-CM | POA: Diagnosis not present

## 2020-12-23 DIAGNOSIS — M25552 Pain in left hip: Secondary | ICD-10-CM | POA: Diagnosis not present

## 2020-12-26 DIAGNOSIS — M7061 Trochanteric bursitis, right hip: Secondary | ICD-10-CM | POA: Diagnosis not present

## 2020-12-26 DIAGNOSIS — M7062 Trochanteric bursitis, left hip: Secondary | ICD-10-CM | POA: Diagnosis not present

## 2020-12-26 DIAGNOSIS — M25552 Pain in left hip: Secondary | ICD-10-CM | POA: Diagnosis not present

## 2020-12-26 DIAGNOSIS — M25551 Pain in right hip: Secondary | ICD-10-CM | POA: Diagnosis not present

## 2020-12-30 DIAGNOSIS — M25551 Pain in right hip: Secondary | ICD-10-CM | POA: Diagnosis not present

## 2020-12-30 DIAGNOSIS — M7062 Trochanteric bursitis, left hip: Secondary | ICD-10-CM | POA: Diagnosis not present

## 2020-12-30 DIAGNOSIS — M25552 Pain in left hip: Secondary | ICD-10-CM | POA: Diagnosis not present

## 2020-12-30 DIAGNOSIS — M7061 Trochanteric bursitis, right hip: Secondary | ICD-10-CM | POA: Diagnosis not present

## 2021-01-06 DIAGNOSIS — M25551 Pain in right hip: Secondary | ICD-10-CM | POA: Diagnosis not present

## 2021-01-06 DIAGNOSIS — M25552 Pain in left hip: Secondary | ICD-10-CM | POA: Diagnosis not present

## 2021-01-06 DIAGNOSIS — M7062 Trochanteric bursitis, left hip: Secondary | ICD-10-CM | POA: Diagnosis not present

## 2021-01-06 DIAGNOSIS — M7061 Trochanteric bursitis, right hip: Secondary | ICD-10-CM | POA: Diagnosis not present

## 2021-01-13 ENCOUNTER — Ambulatory Visit (INDEPENDENT_AMBULATORY_CARE_PROVIDER_SITE_OTHER): Payer: Medicare Other | Admitting: Orthopaedic Surgery

## 2021-01-13 ENCOUNTER — Other Ambulatory Visit: Payer: Self-pay

## 2021-01-13 ENCOUNTER — Encounter: Payer: Self-pay | Admitting: Orthopaedic Surgery

## 2021-01-13 DIAGNOSIS — M7062 Trochanteric bursitis, left hip: Secondary | ICD-10-CM | POA: Diagnosis not present

## 2021-01-13 DIAGNOSIS — M7061 Trochanteric bursitis, right hip: Secondary | ICD-10-CM

## 2021-01-13 NOTE — Progress Notes (Signed)
The patient is following up after having physical therapy to treat bilateral hip trochanteric bursitis.  She says she is about 60 to 70% better and making good improvement.  She has been trying Voltaren gel as well.  Both hips move smoothly and fluidly.  There is some mild pain over the trochanteric area on both hips.  She states that she does not need any injections today.  I talked her about getting back to regular exercise program as she feels comfortable.  We stressed the things to avoid as well.  She can always call to be seen about repeat hip bursa injections in the future if she needs them.  All question concerns were answered and addressed.

## 2021-01-14 DIAGNOSIS — M25552 Pain in left hip: Secondary | ICD-10-CM | POA: Diagnosis not present

## 2021-01-14 DIAGNOSIS — M7061 Trochanteric bursitis, right hip: Secondary | ICD-10-CM | POA: Diagnosis not present

## 2021-01-14 DIAGNOSIS — M7062 Trochanteric bursitis, left hip: Secondary | ICD-10-CM | POA: Diagnosis not present

## 2021-01-14 DIAGNOSIS — M25551 Pain in right hip: Secondary | ICD-10-CM | POA: Diagnosis not present

## 2021-02-09 ENCOUNTER — Other Ambulatory Visit: Payer: Self-pay | Admitting: Family Medicine

## 2021-02-09 DIAGNOSIS — K219 Gastro-esophageal reflux disease without esophagitis: Secondary | ICD-10-CM

## 2021-03-03 IMAGING — MG MM DIGITAL SCREENING BILAT W/ TOMO AND CAD
8 series · 8 of 24 positions shown · non-contrast
Comparison: Previous exam(s).

CLINICAL DATA: Screening.

EXAM:
DIGITAL SCREENING BILATERAL MAMMOGRAM WITH TOMOSYNTHESIS AND CAD
TECHNIQUE: Bilateral screening digital craniocaudal and mediolateral oblique
mammograms were obtained. Bilateral screening digital breast
tomosynthesis was performed. The images were evaluated with
computer-aided detection.

[R CC synth-2D]
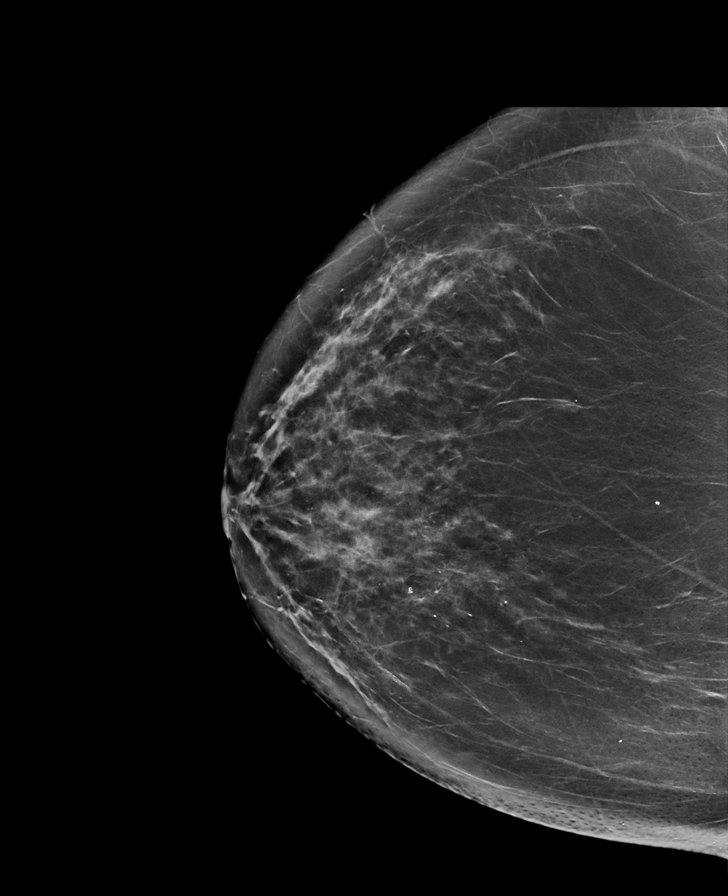

[R MLO synth-2D]
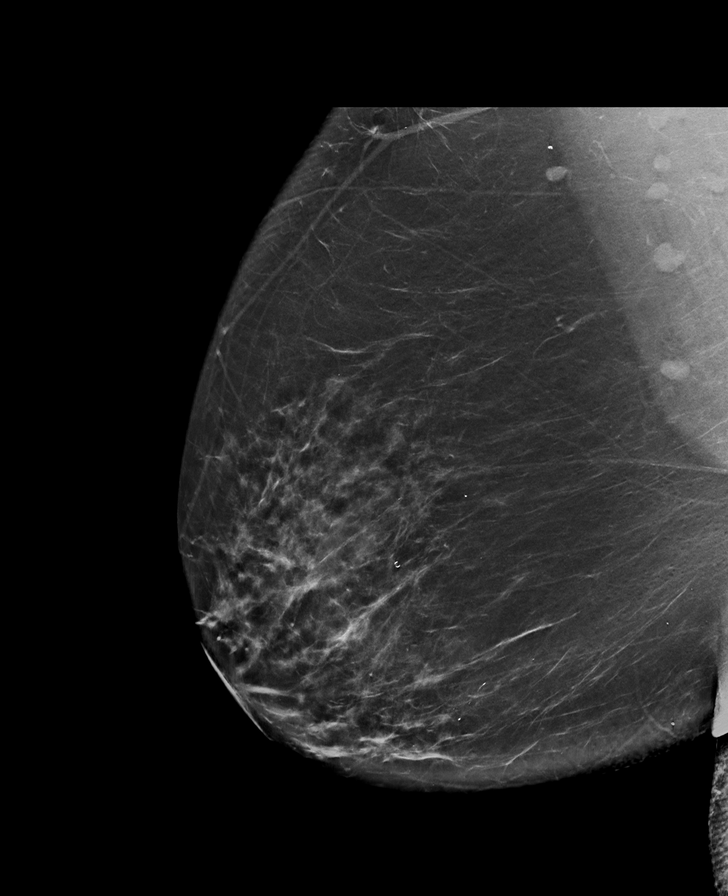

[L MLO synth-2D]
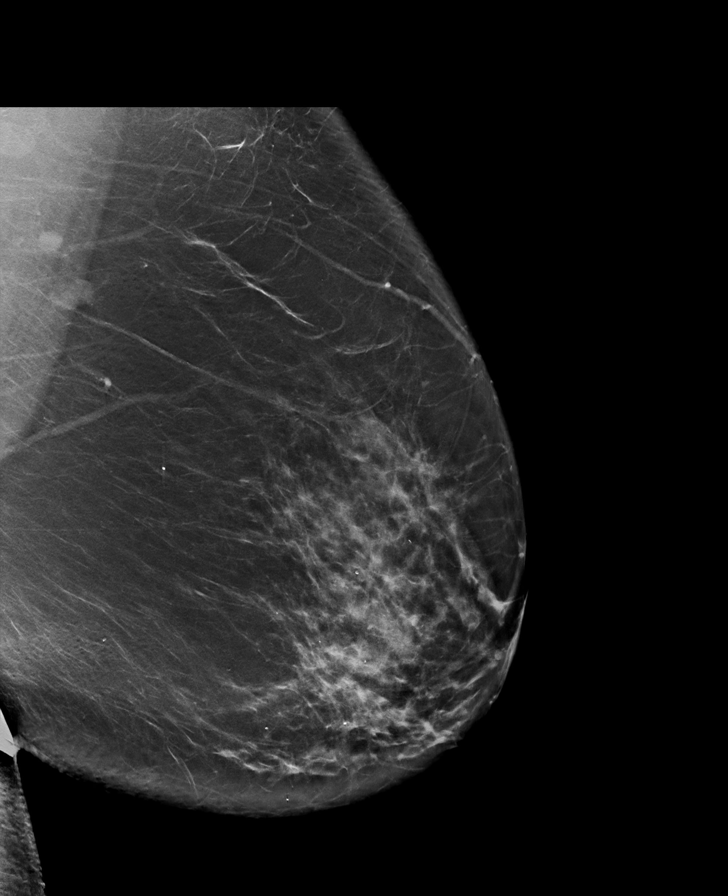

[L CC synth-2D]
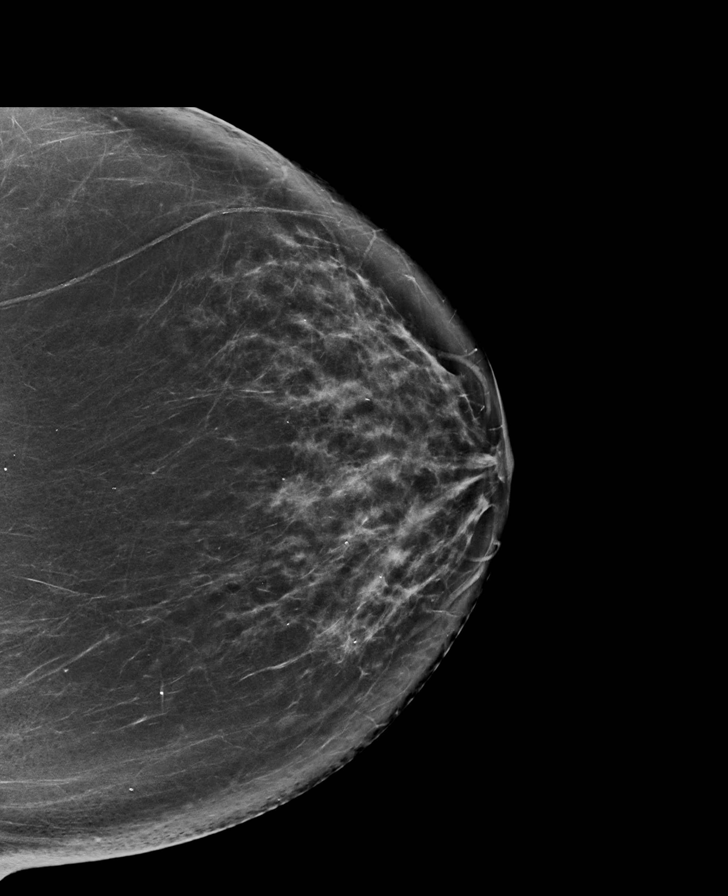

[L CC tomo · tomo slice 43/86.0]
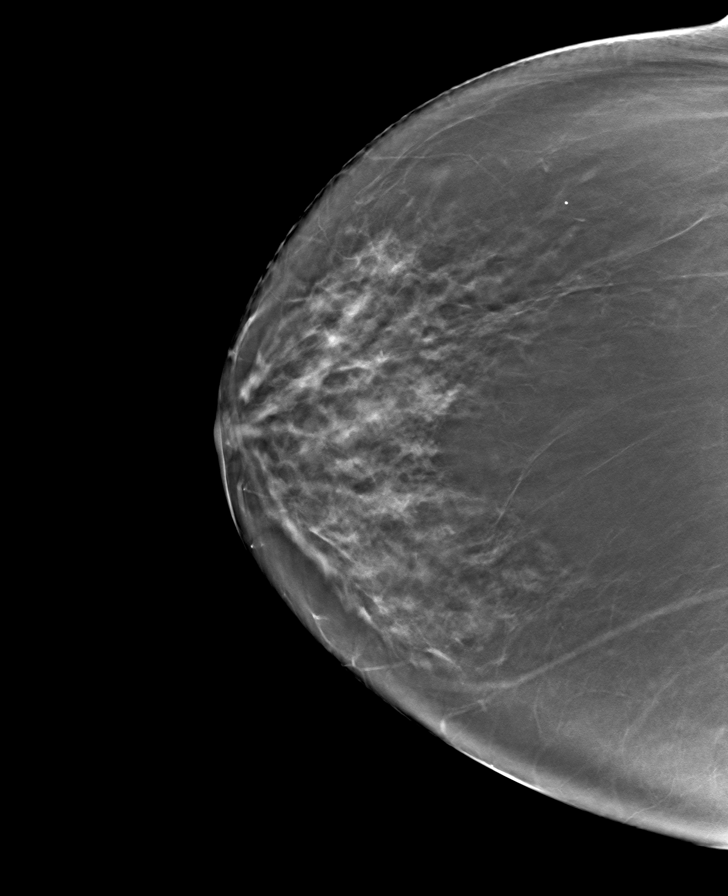

[L MLO tomo · tomo slice 49/98.0]
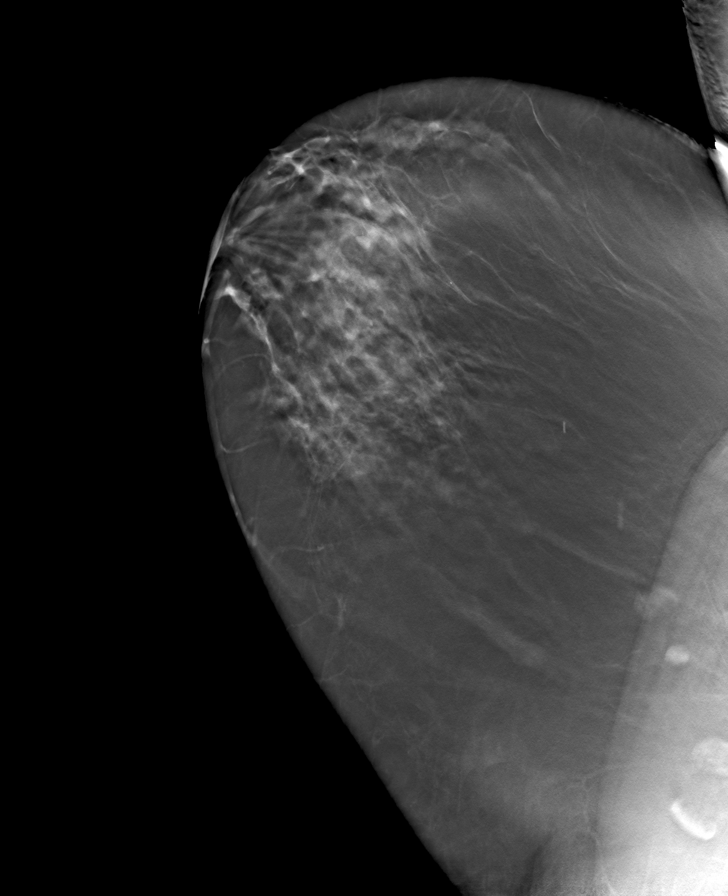

[R CC tomo · tomo slice 44/87.0]
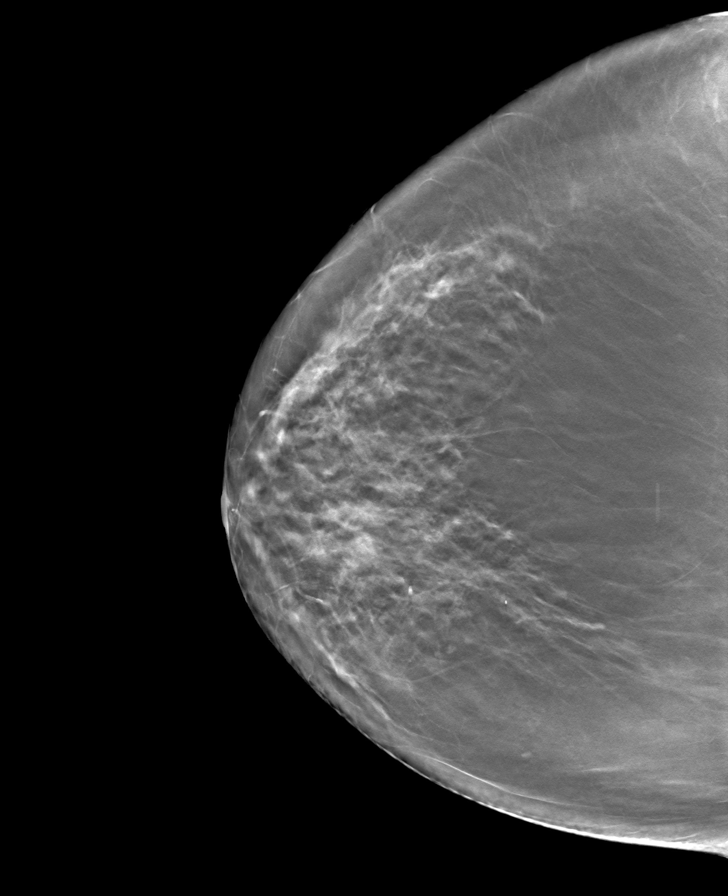

[R MLO tomo · tomo slice 49/97.0]
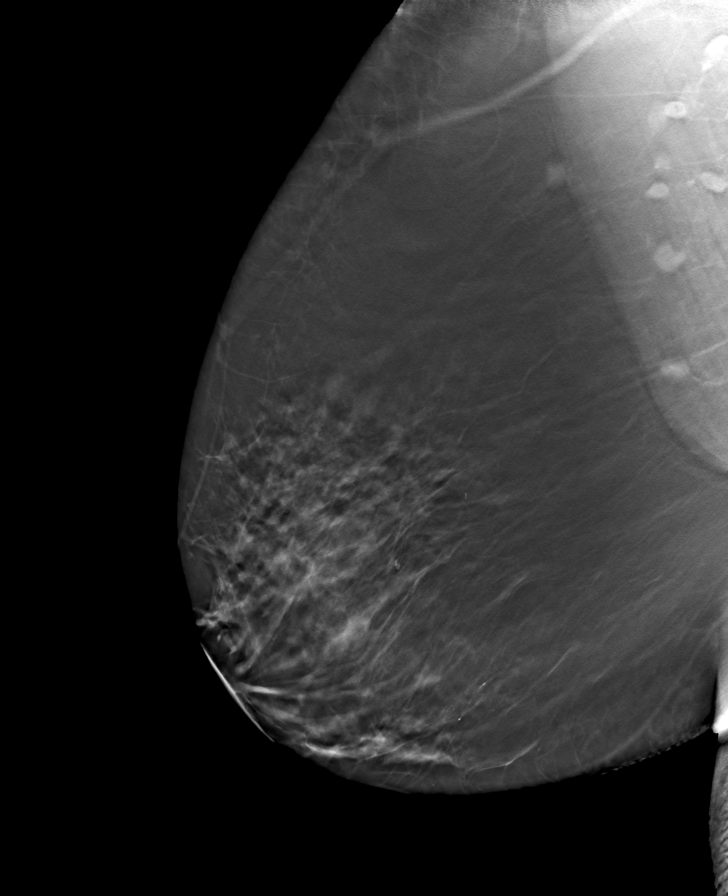

[8 of 24 positions shown; findings below may reference images not displayed]

ACR Breast Density Category c: The breast tissue is heterogeneously
dense, which may obscure small masses.
FINDINGS: There are no findings suspicious for malignancy.
IMPRESSION: No mammographic evidence of malignancy. A result letter of this
screening mammogram will be mailed directly to the patient.

RECOMMENDATION:
Screening mammogram in one year. (Code:[id number]-W-BC3)

BI-RADS CATEGORY  1: Negative.

## 2021-03-19 ENCOUNTER — Ambulatory Visit: Payer: Medicare Other | Admitting: Physician Assistant

## 2021-03-23 ENCOUNTER — Other Ambulatory Visit: Payer: Self-pay

## 2021-03-23 ENCOUNTER — Encounter: Payer: Self-pay | Admitting: Orthopaedic Surgery

## 2021-03-23 ENCOUNTER — Ambulatory Visit (INDEPENDENT_AMBULATORY_CARE_PROVIDER_SITE_OTHER): Payer: Medicare Other | Admitting: Orthopaedic Surgery

## 2021-03-23 DIAGNOSIS — M7061 Trochanteric bursitis, right hip: Secondary | ICD-10-CM

## 2021-03-23 DIAGNOSIS — M7062 Trochanteric bursitis, left hip: Secondary | ICD-10-CM

## 2021-03-23 MED ORDER — LIDOCAINE HCL 1 % IJ SOLN
3.0000 mL | INTRAMUSCULAR | Status: AC | PRN
  Administered 2021-03-23: 3 mL

## 2021-03-23 MED ORDER — METHYLPREDNISOLONE ACETATE 40 MG/ML IJ SUSP
40.0000 mg | INTRAMUSCULAR | Status: AC | PRN
  Administered 2021-03-23: 40 mg via INTRA_ARTICULAR

## 2021-03-23 NOTE — Progress Notes (Signed)
Office Visit Note   Patient: Peggy Santiago           Date of Birth: 09/09/1950           MRN: [id number] Visit Date: 03/23/2021              Requested by: Darreld Mclean, MD [address] [place],  [place] [address] PCP: Darreld Mclean, MD   Assessment & Plan: Visit Diagnoses:  1. Trochanteric bursitis, left hip   2. Trochanteric bursitis, right hip     Plan: Unfortunately she does have quite chronic and severe trochanteric bursitis of both hips.  I did recommend a steroid injection on both sides and agreed to this and she tolerated well.  I would like to repeat these again in 9 weeks to see if this will help improve her situation.  I also recommended weight loss.  Follow-Up Instructions: Return in about 9 weeks (around 05/25/2021), or if symptoms worsen or fail to improve.   Orders:  Orders Placed This Encounter  Procedures   Large Joint Inj   Large Joint Inj   No orders of the defined types were placed in this encounter.     Procedures: Large Joint Inj: R greater trochanter on 03/23/2021 9:38 AM Indications: pain and diagnostic evaluation Details: 22 G 1.5 in needle, lateral approach  Arthrogram: No  Medications: 3 mL lidocaine 1 %; 40 mg methylPREDNISolone acetate 40 MG/ML Outcome: tolerated well, no immediate complications Procedure, treatment alternatives, risks and benefits explained, specific risks discussed. Consent was given by the patient. Immediately prior to procedure a time out was called to verify the correct patient, procedure, equipment, support staff and site/side marked as required. Patient was prepped and draped in the usual sterile fashion.    Large Joint Inj: L greater trochanter on 03/23/2021 9:38 AM Indications: pain and diagnostic evaluation Details: 22 G 1.5 in needle, lateral approach  Arthrogram: No  Medications: 3 mL lidocaine 1 %; 40 mg methylPREDNISolone acetate 40 MG/ML Outcome: tolerated well, no immediate  complications Procedure, treatment alternatives, risks and benefits explained, specific risks discussed. Consent was given by the patient. Immediately prior to procedure a time out was called to verify the correct patient, procedure, equipment, support staff and site/side marked as required. Patient was prepped and draped in the usual sterile fashion.      Clinical Data: No additional findings.   Subjective: Chief Complaint  Patient presents with   Right Hip - Follow-up   Left Hip - Follow-up  The patient comes in today requesting steroid injections around both hip trochanteric areas.  She has had a history of trochanteric bursitis of both hips.  It has been very frustrating for her appropriately given the pain that she has been experiencing.  She still denies groin pain and past x-rays of both hips shows normal-appearing hips.  She is try topical anti-inflammatories and essential oils as well.  She has been through physical therapy for this as well.  She is someone who is obese and that may contribute to her bursitis and she is had previous knee surgery as well.  She is nondiabetic. HPI  Review of Systems  There is currently listed no headache, chest pain, shortness of breath, fever, chills, nausea, vomiting Objective: Vital Signs: There were no vitals taken for this visit.  Physical Exam She is alert and orient x3 and in no acute distress Ortho Exam Examination of both hips show the move smoothly and fluidly.  She has severe pain over the trochanteric area to palpation of both hips.  There is no redness either. Specialty Comments:  No specialty comments available.  Imaging: No results found.   PMFS History: Patient Active Problem List   Diagnosis Date Noted   Osteopenia 07/02/2017   Dyslipidemia 06/15/2016   Pernicious anemia 06/10/2015   Allergic rhinitis 06/10/2015   IBS (irritable bowel syndrome) 06/10/2015   Nephrolithiasis 06/10/2015   Migraine 06/10/2015    Cervical spondylosis without myelopathy 06/10/2015   DDD (degenerative disc disease), cervical 06/10/2015   S/P total knee arthroplasty 03/25/2014   Arthritis of knee, degenerative 02/26/2014   Type II or unspecified type diabetes mellitus without mention of complication, not stated as uncontrolled 02/23/2014   Carpal tunnel syndrome 02/02/2013   Past Medical History:  Diagnosis Date   Allergy    Anemia    Chronic kidney disease    kidney stones   Diabetes mellitus without complication (HCC)    borderline   Osteoarthritis of knee    PONV (postoperative nausea and vomiting)     Family History  Problem Relation Age of Onset   Heart disease Mother    Rheum arthritis Mother    Alzheimer's disease Mother    Heart disease Father    Diabetes Sister    Heart disease Sister    Hyperlipidemia Sister    Cancer Brother    Heart disease Brother    Hyperlipidemia Brother    Hyperlipidemia Sister    Heart disease Sister    Diabetes Sister     Past Surgical History:  Procedure Laterality Date   ABDOMINAL HYSTERECTOMY     Partial; one ovary remains   ANKLE SURGERY     with plate   APPENDECTOMY     CHOLECYSTECTOMY     colon resection due to SBO     EYE SURGERY     JOINT REPLACEMENT     2 scopes rt knee   TOTAL KNEE ARTHROPLASTY Right 03/25/2014   Procedure: TOTAL KNEE ARTHROPLASTY;  Surgeon: Vickey Huger, MD;  Location: [facility];  Service: Orthopedics;  Laterality: Right;   Social History   Occupational History   Occupation: Health visitor  Tobacco Use   Smoking status: Never   Smokeless tobacco: Never  Vaping Use   Vaping Use: Never used  Substance and Sexual Activity   Alcohol use: No    Alcohol/week: 0.0 standard drinks   Drug use: No   Sexual activity: Yes    Birth control/protection: Surgical

## 2021-05-25 ENCOUNTER — Ambulatory Visit: Payer: Medicare Other | Admitting: Orthopaedic Surgery

## 2021-06-01 ENCOUNTER — Other Ambulatory Visit: Payer: Self-pay

## 2021-06-01 ENCOUNTER — Encounter: Payer: Self-pay | Admitting: Orthopaedic Surgery

## 2021-06-01 ENCOUNTER — Ambulatory Visit (INDEPENDENT_AMBULATORY_CARE_PROVIDER_SITE_OTHER): Payer: Medicare Other | Admitting: Orthopaedic Surgery

## 2021-06-01 VITALS — Ht 61.5 in | Wt 196.0 lb

## 2021-06-01 DIAGNOSIS — M7062 Trochanteric bursitis, left hip: Secondary | ICD-10-CM

## 2021-06-01 DIAGNOSIS — M7061 Trochanteric bursitis, right hip: Secondary | ICD-10-CM | POA: Diagnosis not present

## 2021-06-01 NOTE — Progress Notes (Signed)
The patient is still dealing with quite painful bilateral hip trochanteric bursitis of the left worse than right.  She is 70 years old.  She has been through multiple rounds of physical therapy.  She has had at least 4 steroid injections and has been on anti-inflammatories.  She is worked on activity modification and the pain is actually getting worse for her at this point with her left hip being worse than the right.  She tries to offload her hips and tries not to sleep on her hips at night.  She is starting to report some groin pain on the left side now that she has not had before.  On exam she has severe pain that is very sensitive over the trochanteric area on both hips.  Given the failure of conservative treatment including time, anti-inflammatories, outpatient physical therapy, multiple steroid injections and activity modification as well as weight loss, a MRI of the left hip is warranted to assess the hip cartilage and trochanteric area and to help determine the source of her pain so we can hopefully come up with a treatment regimen for her given the debilitating fact this is having on her quality of life over the last 9 months.  She will get a follow-up appointment with [place] once we have the MRI.

## 2021-06-26 ENCOUNTER — Telehealth: Payer: Self-pay | Admitting: Family

## 2021-06-26 ENCOUNTER — Ambulatory Visit (HOSPITAL_BASED_OUTPATIENT_CLINIC_OR_DEPARTMENT_OTHER)
Admission: RE | Admit: 2021-06-26 | Discharge: 2021-06-26 | Disposition: A | Discharge status: Home / Self Care | Payer: Medicare Other | Source: Ambulatory Visit | Attending: Family | Admitting: Family

## 2021-06-26 ENCOUNTER — Other Ambulatory Visit: Payer: Self-pay

## 2021-06-26 ENCOUNTER — Ambulatory Visit (INDEPENDENT_AMBULATORY_CARE_PROVIDER_SITE_OTHER): Payer: Medicare Other | Admitting: Family

## 2021-06-26 VITALS — BP 146/80 | HR 74 | Temp 98.6°F | Resp 16 | Ht 62.0 in | Wt 189.0 lb

## 2021-06-26 DIAGNOSIS — R0789 Other chest pain: Secondary | ICD-10-CM | POA: Diagnosis not present

## 2021-06-26 DIAGNOSIS — R079 Chest pain, unspecified: Secondary | ICD-10-CM | POA: Diagnosis not present

## 2021-06-26 DIAGNOSIS — D649 Anemia, unspecified: Secondary | ICD-10-CM

## 2021-06-26 DIAGNOSIS — R109 Unspecified abdominal pain: Secondary | ICD-10-CM

## 2021-06-26 LAB — URINALYSIS, ROUTINE W REFLEX MICROSCOPIC
Bilirubin Urine: NEGATIVE
Ketones, ur: NEGATIVE
Leukocytes,Ua: NEGATIVE
Nitrite: NEGATIVE
Specific Gravity, Urine: >=1.03 — AB (ref 1.000–1.030)
Total Protein, Urine: NEGATIVE
Urine Glucose: NEGATIVE
Urobilinogen, UA: 0.2 (ref 0.0–1.0)
pH: 5.5 (ref 5.0–8.0)

## 2021-06-26 LAB — CBC WITH DIFFERENTIAL/PLATELET
Basophils Absolute: 0 10*3/uL (ref 0.0–0.1)
Basophils Relative: 0.7 % (ref 0.0–3.0)
Eosinophils Absolute: 0.3 10*3/uL (ref 0.0–0.7)
Eosinophils Relative: 4.1 % (ref 0.0–5.0)
HCT: 34.9 % — ABNORMAL LOW (ref 36.0–46.0)
Hemoglobin: 11.5 g/dL — ABNORMAL LOW (ref 12.0–15.0)
Lymphocytes Relative: 32.5 % (ref 12.0–46.0)
Lymphs Abs: 2 10*3/uL (ref 0.7–4.0)
MCHC: 33 g/dL (ref 30.0–36.0)
MCV: 90.9 fl (ref 78.0–100.0)
Monocytes Absolute: 0.4 10*3/uL (ref 0.1–1.0)
Monocytes Relative: 6.9 % (ref 3.0–12.0)
Neutro Abs: 3.5 10*3/uL (ref 1.4–7.7)
Neutrophils Relative %: 55.8 % (ref 43.0–77.0)
Platelets: 317 10*3/uL (ref 150.0–400.0)
RBC: 3.84 Mil/uL — ABNORMAL LOW (ref 3.87–5.11)
RDW: 13.1 % (ref 11.5–15.5)
WBC: 6.2 10*3/uL (ref 4.0–10.5)

## 2021-06-26 LAB — COMPREHENSIVE METABOLIC PANEL
ALT: 10 U/L (ref 0–35)
AST: 11 U/L (ref 0–37)
Albumin: 3.8 g/dL (ref 3.5–5.2)
Alkaline Phosphatase: 64 U/L (ref 39–117)
BUN: 15 mg/dL (ref 6–23)
CO2: 27 mEq/L (ref 19–32)
Calcium: 9.4 mg/dL (ref 8.4–10.5)
Chloride: 107 mEq/L (ref 96–112)
Creatinine, Ser: 0.91 mg/dL (ref 0.40–1.20)
GFR: 64.54 mL/min (ref >60.00)
Glucose, Bld: 133 mg/dL — ABNORMAL HIGH (ref 70–99)
Potassium: 4.1 mEq/L (ref 3.5–5.1)
Sodium: 141 mEq/L (ref 135–145)
Total Bilirubin: 0.4 mg/dL (ref 0.2–1.2)
Total Protein: 6.8 g/dL (ref 6.0–8.3)

## 2021-06-26 NOTE — Assessment & Plan Note (Addendum)
Suspect pleuritic chest pain. EKG tracing is personally reviewed.  EKG notes NSR.  No acute changes. No risk factors for PE. Recommended chest x-ray, tylenol prn. Pt is advised to go to the ER if worsening chest pain or shortness of breath.

## 2021-06-26 NOTE — Patient Instructions (Addendum)
If worsening of chest pain or shortness of breath occurs, please seek care at an emergency department.  Please complete lab work prior to leaving. Complete chest x-ray on the first floor.

## 2021-06-26 NOTE — Assessment & Plan Note (Signed)
New.  Suspect musculoskeletal etiology given that it is reproducible. She is concerned about possibility of kidney stone. Will obtain urinalysis to exclude hematuria.

## 2021-06-26 NOTE — Progress Notes (Signed)
Subjective:   By signing my name below, I, Lyric Barr-McArthur, attest that this documentation has been prepared under the direction and in the presence of Debbrah Alar, NP, 06/26/2021     Patient ID: Peggy Santiago, female    DOB: 03/21/1951, 70 y.o.   MRN: [id number]  Chief Complaint  Patient presents with   GI Problem    Complains of upset stomach,   Back Pain    Complains of back pain on right side     HPI Patient is in today for an office visit. Her husband is present with her in the office today.   Flu symptoms: She notes that one week ago she began experiencing abdominal pain, nausea, decreased appetite, body aches and chills. She also was experiencing shortness of breathe and chest tightness. She reports she began coughing today and she also began having back pain in her right lower back. She denies any sore throat or congestion. She denies reflux, recent long travels, or periods of staying static. Back pain: She began experiencing lower right back pain within the last few days. She denies any frequency, urgency, dysuria, or hematuria at this time. She notes the pain is worsened with a deep breath. She denies any calf pain or swelling.   Health Maintenance Due  Topic Date Due   Zoster Vaccines- Shingrix (1 of 2) Never done   COVID-19 Vaccine (3 - Pfizer risk series) 04/28/2020   Fecal DNA (Cologuard)  11/05/2020   OPHTHALMOLOGY EXAM  01/09/2021   INFLUENZA VACCINE  02/09/2021   HEMOGLOBIN A1C  02/18/2021   TETANUS/TDAP  06/11/2021   URINE MICROALBUMIN  08/21/2021    Past Medical History:  Diagnosis Date   Allergy    Anemia    Chronic kidney disease    kidney stones   Diabetes mellitus without complication (HCC)    borderline   Osteoarthritis of knee    PONV (postoperative nausea and vomiting)     Past Surgical History:  Procedure Laterality Date   ABDOMINAL HYSTERECTOMY     Partial; one ovary remains   ANKLE SURGERY     with plate   APPENDECTOMY      CHOLECYSTECTOMY     colon resection due to SBO     EYE SURGERY     JOINT REPLACEMENT     2 scopes rt knee   TOTAL KNEE ARTHROPLASTY Right 03/25/2014   Procedure: TOTAL KNEE ARTHROPLASTY;  Surgeon: Vickey Huger, MD;  Location: [facility];  Service: Orthopedics;  Laterality: Right;    Family History  Problem Relation Age of Onset   Heart disease Mother    Rheum arthritis Mother    Alzheimer's disease Mother    Heart disease Father    Diabetes Sister    Heart disease Sister    Hyperlipidemia Sister    Cancer Brother    Heart disease Brother    Hyperlipidemia Brother    Hyperlipidemia Sister    Heart disease Sister    Diabetes Sister     Social History   Socioeconomic History   Marital status: Married    Spouse name: Annia Belt"   Number of children: 2   Years of education: 12th grade   Highest education level: Not on file  Occupational History   Occupation: Health visitor  Tobacco Use   Smoking status: Never   Smokeless tobacco: Never  Vaping Use   Vaping Use: Never used  Substance and Sexual Activity   Alcohol use: No  Alcohol/week: 0.0 standard drinks   Drug use: No   Sexual activity: Yes    Birth control/protection: Surgical  Other Topics Concern   Not on file  Social History Narrative   Lives with her husband.   Their grown daughters live locally.   Social Determinants of Health   Financial Resource Strain: Not on file  Food Insecurity: Not on file  Transportation Needs: Not on file  Physical Activity: Not on file  Stress: Not on file  Social Connections: Not on file  Intimate Partner Violence: Not on file    Outpatient Medications Prior to Visit  Medication Sig Dispense Refill   methylPREDNISolone (MEDROL) 4 MG tablet Medrol dose pack. Take as instructed (Patient not taking: Reported on 06/26/2021) 21 tablet 0   Multiple Vitamin (MULTIVITAMIN WITH MINERALS) TABS tablet Take 1 tablet by mouth daily.     omeprazole (PRILOSEC) 40 MG  capsule Take 1 capsule by mouth once daily 90 capsule 1   Alirocumab (PRALUENT) 75 MG/ML SOAJ Inject 75 mg into the skin every 14 (fourteen) days. (Patient not taking: Reported on 06/26/2021) 2 mL 5   cyanocobalamin (,VITAMIN B-12,) 1000 MCG/ML injection Inject 1 mL (1,000 mcg total) into the muscle every 30 (thirty) days. (Patient not taking: Reported on 06/26/2021) 10 mL 1   celecoxib (CELEBREX) 200 MG capsule Take 1 capsule by mouth once daily (Patient not taking: Reported on 06/26/2021) 90 capsule 0   No facility-administered medications prior to visit.    Allergies  Allergen Reactions   Statins     Body aches, has tried several times and does not tolerate well   Contrast Media [Iodinated Diagnostic Agents] Hives, Rash and Other (See Comments)    ALL OVER THE BODY   Ioxaglate Rash and Hives    ALL OVER THE BODY    Review of Systems  Constitutional:  Positive for chills and malaise/fatigue.  HENT:  Negative for congestion, ear pain, sinus pain and sore throat.   Respiratory:  Positive for cough and shortness of breath.   Gastrointestinal:  Positive for abdominal pain (improved as of today) and nausea (improved as of today).  Genitourinary:  Negative for dysuria, frequency, hematuria and urgency.  Musculoskeletal:  Positive for back pain (in lower right back).       (+) body aches      Objective:    Physical Exam Constitutional:      General: She is not in acute distress.    Appearance: Normal appearance. She is not ill-appearing.  HENT:     Head: Normocephalic and atraumatic.     Right Ear: External ear normal.     Left Ear: External ear normal.  Eyes:     Extraocular Movements: Extraocular movements intact.     Pupils: Pupils are equal, round, and reactive to light.  Cardiovascular:     Rate and Rhythm: Normal rate and regular rhythm.     Heart sounds: Normal heart sounds. No murmur heard.   No gallop.  Pulmonary:     Effort: Pulmonary effort is normal. No  respiratory distress.     Breath sounds: Normal breath sounds. No wheezing or rales.  Musculoskeletal:     Comments: Right lower back/flank- very tender to palpation  Skin:    General: Skin is warm and dry.  Neurological:     Mental Status: She is alert and oriented to Santiago, place, and time.  Psychiatric:        Attention and Perception: Attention normal.  Mood and Affect: Mood is anxious.        Speech: Speech normal.        Behavior: Behavior normal.        Judgment: Judgment normal.    BP (!) 146/80 (BP Location: Right Arm, Patient Position: Sitting, Cuff Size: Small)    Pulse 74    Temp 98.6 F (37 C) (Oral)    Resp 16    Ht _0  (1.575 m)    Wt 189 lb (85.7 kg)    SpO2 97%    BMI 34.57 kg/m  Wt Readings from Last 3 Encounters:  06/26/21 189 lb (85.7 kg)  06/01/21 196 lb (88.9 kg)  12/01/20 189 lb (85.7 kg)       Assessment & Plan:   Problem List Items Addressed This Visit       Unprioritized   Flank pain    New.  Suspect musculoskeletal etiology given that it is reproducible. She is concerned about possibility of kidney stone. Will obtain urinalysis to exclude hematuria.      Relevant Orders   Urinalysis, Routine w reflex microscopic   Comp Met (CMET)   Atypical chest pain - Primary    Suspect pleuritic chest pain. EKG tracing is personally reviewed.  EKG notes NSR.  No acute changes. No risk factors for PE. Recommended chest x-ray, tylenol prn. Pt is advised to go to the ER if worsening chest pain or shortness of breath.       Relevant Orders   EKG 12-Lead (Completed)   DG Chest 2 View   CBC with Differential/Platelet   No orders of the defined types were placed in this encounter.   I, Debbrah Alar, NP, personally preformed the services described in this documentation.  All medical record entries made by the scribe were at my direction and in my presence.  I have reviewed the chart and discharge instructions (if applicable) and agree that the  record reflects my personal performance and is accurate and complete. 06/26/2021  I,Lyric Barr-McArthur,acting as a Education administrator for Nance Pear, NP.,have documented all relevant documentation on the behalf of Nance Pear, NP,as directed by  Nance Pear, NP while in the presence of Nance Pear, NP.  Nance Pear, NP

## 2021-06-26 NOTE — Telephone Encounter (Signed)
Lab work shows mild anemia. I would like for her to complete an IFOB please to look for any hidden blood in the stool. Make sure she is taking the multivitamin with minerals once daily as well as the vitamin B12 injections.  Follow up with Dr. Lorelei Pont as scheduled in February.   Other lab work is stable.

## 2021-06-26 NOTE — Telephone Encounter (Signed)
Patient advised of results and instructed on having IFOB. Advised to continue multivitamins with minerals and B12 shots.  Orders placed for IFOB as future

## 2021-06-28 ENCOUNTER — Other Ambulatory Visit: Payer: Self-pay

## 2021-06-28 ENCOUNTER — Ambulatory Visit
Admission: RE | Admit: 2021-06-28 | Discharge: 2021-06-28 | Disposition: A | Discharge status: Home / Self Care | Payer: Medicare Other | Source: Ambulatory Visit | Attending: Orthopaedic Surgery | Admitting: Orthopaedic Surgery

## 2021-06-28 DIAGNOSIS — M25552 Pain in left hip: Secondary | ICD-10-CM | POA: Diagnosis not present

## 2021-06-28 DIAGNOSIS — M7062 Trochanteric bursitis, left hip: Secondary | ICD-10-CM

## 2021-07-01 ENCOUNTER — Other Ambulatory Visit: Payer: Self-pay

## 2021-07-01 ENCOUNTER — Ambulatory Visit (INDEPENDENT_AMBULATORY_CARE_PROVIDER_SITE_OTHER): Payer: Medicare Other | Admitting: Physician Assistant

## 2021-07-01 ENCOUNTER — Encounter: Payer: Self-pay | Admitting: Physician Assistant

## 2021-07-01 ENCOUNTER — Other Ambulatory Visit: Payer: Self-pay | Admitting: Family Medicine

## 2021-07-01 DIAGNOSIS — M1712 Unilateral primary osteoarthritis, left knee: Secondary | ICD-10-CM

## 2021-07-01 DIAGNOSIS — M7061 Trochanteric bursitis, right hip: Secondary | ICD-10-CM | POA: Diagnosis not present

## 2021-07-01 DIAGNOSIS — M7062 Trochanteric bursitis, left hip: Secondary | ICD-10-CM

## 2021-07-01 NOTE — Progress Notes (Signed)
HPI: Peggy Santiago comes in today for follow-up of her left hip MRI.  She continues to have pain lateral aspect of both hips primarily the left.  Again she has had therapy in the past which is helped.  She is also had trochanteric injections which have given her minimal relief.  She states she has most pain when lying on her hips at night.  They feel tight.  She has had no new injuries.  She has some occasional groin pain on the left and locking sensation.  She denies any back pain.  MRI reviewed with the patient.  MRI of the left hip shows no acute findings.  Notice significant articular changes there is some mild narrowing of the articular cartilage but no focal full-thickness defects.  No labral tears but again this is a MRI without contrast.  Bursa bilateral air within normal limits.  Tendinosis of the proximal left hamstring was noted.  Physical exam: Bilateral hips good range of motion of both hips without pain.  Tenderness over the trochanteric regions bilaterally.  Impression: Bilateral hip trochanteric bursitis  Plan: She is given a prescription for physical therapy to work on IT band stretching.  We will have her do therapy for 6 weeks again a home exercise program.  She will follow-up with [place] in 6 weeks.  Questions were encouraged and answered at length.

## 2021-07-09 ENCOUNTER — Encounter: Payer: Self-pay | Admitting: Family Medicine

## 2021-07-09 MED ORDER — CELECOXIB 200 MG PO CAPS: 200.0000 mg | ORAL_CAPSULE | Freq: Every day | ORAL | 3 refills | Status: AC

## 2021-07-10 ENCOUNTER — Other Ambulatory Visit (INDEPENDENT_AMBULATORY_CARE_PROVIDER_SITE_OTHER): Payer: Medicare Other

## 2021-07-10 DIAGNOSIS — D649 Anemia, unspecified: Secondary | ICD-10-CM | POA: Diagnosis not present

## 2021-07-10 LAB — FECAL OCCULT BLOOD, IMMUNOCHEMICAL: Fecal Occult Bld: NEGATIVE

## 2021-07-10 NOTE — Progress Notes (Signed)
IFOB drop off.

## 2021-07-14 ENCOUNTER — Ambulatory Visit: Payer: Medicare Other | Admitting: Orthopaedic Surgery

## 2021-07-15 DIAGNOSIS — M25552 Pain in left hip: Secondary | ICD-10-CM | POA: Diagnosis not present

## 2021-07-15 DIAGNOSIS — M25551 Pain in right hip: Secondary | ICD-10-CM | POA: Diagnosis not present

## 2021-07-20 DIAGNOSIS — M25552 Pain in left hip: Secondary | ICD-10-CM | POA: Diagnosis not present

## 2021-07-20 DIAGNOSIS — M25551 Pain in right hip: Secondary | ICD-10-CM | POA: Diagnosis not present

## 2021-07-23 DIAGNOSIS — M25552 Pain in left hip: Secondary | ICD-10-CM | POA: Diagnosis not present

## 2021-07-23 DIAGNOSIS — M25551 Pain in right hip: Secondary | ICD-10-CM | POA: Diagnosis not present

## 2021-07-27 DIAGNOSIS — M25552 Pain in left hip: Secondary | ICD-10-CM | POA: Diagnosis not present

## 2021-07-27 DIAGNOSIS — M25551 Pain in right hip: Secondary | ICD-10-CM | POA: Diagnosis not present

## 2021-07-30 ENCOUNTER — Other Ambulatory Visit (HOSPITAL_BASED_OUTPATIENT_CLINIC_OR_DEPARTMENT_OTHER): Payer: Self-pay | Admitting: Family Medicine

## 2021-07-30 DIAGNOSIS — Z1231 Encounter for screening mammogram for malignant neoplasm of breast: Secondary | ICD-10-CM

## 2021-07-31 DIAGNOSIS — M25552 Pain in left hip: Secondary | ICD-10-CM | POA: Diagnosis not present

## 2021-07-31 DIAGNOSIS — M25551 Pain in right hip: Secondary | ICD-10-CM | POA: Diagnosis not present

## 2021-08-03 DIAGNOSIS — M25551 Pain in right hip: Secondary | ICD-10-CM | POA: Diagnosis not present

## 2021-08-03 DIAGNOSIS — M25552 Pain in left hip: Secondary | ICD-10-CM | POA: Diagnosis not present

## 2021-08-06 DIAGNOSIS — M25551 Pain in right hip: Secondary | ICD-10-CM | POA: Diagnosis not present

## 2021-08-06 DIAGNOSIS — M25552 Pain in left hip: Secondary | ICD-10-CM | POA: Diagnosis not present

## 2021-08-10 DIAGNOSIS — M25551 Pain in right hip: Secondary | ICD-10-CM | POA: Diagnosis not present

## 2021-08-10 DIAGNOSIS — M25552 Pain in left hip: Secondary | ICD-10-CM | POA: Diagnosis not present

## 2021-08-12 ENCOUNTER — Other Ambulatory Visit: Payer: Self-pay

## 2021-08-12 ENCOUNTER — Ambulatory Visit (INDEPENDENT_AMBULATORY_CARE_PROVIDER_SITE_OTHER): Payer: Medicare Other | Admitting: Physician Assistant

## 2021-08-12 ENCOUNTER — Encounter: Payer: Self-pay | Admitting: Physician Assistant

## 2021-08-12 DIAGNOSIS — M7062 Trochanteric bursitis, left hip: Secondary | ICD-10-CM

## 2021-08-12 DIAGNOSIS — M7061 Trochanteric bursitis, right hip: Secondary | ICD-10-CM

## 2021-08-12 NOTE — Progress Notes (Signed)
HPI: Mrs. Lerch returns today for follow-up of her bilateral trochanteric bursitis.  She states overall that she is much improved in regards to her right hip having no pain.  Left hip 70% better.  She feels therapy is helped.  She is having no radicular symptoms down either leg.  Notes that at worst over the last week her left hip is bothering her.  Would rank her pain to be 5-6 out of 10 pain most with prolonged sitting.  She is unable to lie on the left hip due to the pain.  Review of systems see HPI negative  Physical exam: Bilateral hips excellent range of motion of both hips without pain.  Nontender over the right hip trochanteric region.  Tenderness over the left hip joint region.  Impression: Right hip trochanteric bursitis improved Left hip trochanteric bursitis  Plan: Recommend she continue to work with therapy on IT band stretching.  She may consider a trach injection in the future on the left if her pain persist or becomes worse she will just call our office and let us know.  Otherwise follow-up as needed.  Questions were encouraged and answered

## 2021-08-18 ENCOUNTER — Encounter: Payer: Self-pay | Admitting: Family Medicine

## 2021-08-20 DIAGNOSIS — M25552 Pain in left hip: Secondary | ICD-10-CM | POA: Diagnosis not present

## 2021-08-20 DIAGNOSIS — M25551 Pain in right hip: Secondary | ICD-10-CM | POA: Diagnosis not present

## 2021-08-21 NOTE — Progress Notes (Addendum)
[facility] [place], [place], [place] [address] [phone]  Date:  08/24/2021   Name:  Peggy Santiago   DOB:  December 14, 1950   MRN:  [id number]  PCP:  Darreld Mclean, MD    Chief Complaint: yearly exam-Medicare  (Concerns/ questions: none/Zoster- none in ncir/Eye exam: over a year ago- has had some problems with a blood vessel under the right eye/Flu shot today:declines)   History of Present Illness:  Peggy Santiago is a 71 y.o. very pleasant female patient who presents with the following:  Pt seen today for annual exam, medicare Last seen by myself 3/22 History of osteopenia, dyslipidemia, diabetes, migraine headache, cervical spine disease, B12 deficiency   She did see Melissa in December with atypical chest pain- exam and EKG reassuring.  The CP has resolved  Ortho is seeing her for trochanteric bursitis as well  She is doing some PT- this is still bothering her some - the right side is better but the left side is bothering her more now   She had a subconjunctival hemorrhage 3 times recently- always the right side. Cleared up but because this happened 3 times she is bit concerned.  Currently cleared  No trauma that she can think of  Her vision is not quite sharp in both eyes She is due for a vision check - in fact she has an appt next week, I advised this is the right next step for her   She does not check her glucose at home   Wt Readings from Last 3 Encounters:  08/24/21 188 lb 6.4 oz (85.5 kg)  06/26/21 189 lb (85.7 kg)  06/01/21 196 lb (88.9 kg)   They are trying to be more active- walking for exercise and watching her diet  She is down about 10 lbs   She is still taking omeprazole  She does B12 shots daily Celebrex as well for her hip   Lab Results  Component Value Date   HGBA1C 6.2 08/21/2020    Shingrix Covid booster Eye exam- scheduled for next week  Flu shot-declines A1c, labs  are due  Dexa can be updated- will order  Foot exam due  Mammo scheduled  CMP, CBC done in December   Patient Active Problem List   Diagnosis Date Noted   Atypical chest pain 06/26/2021   Flank pain 06/26/2021   Osteopenia 07/02/2017   Dyslipidemia 06/15/2016   Pernicious anemia 06/10/2015   Allergic rhinitis 06/10/2015   IBS (irritable bowel syndrome) 06/10/2015   Nephrolithiasis 06/10/2015   Migraine 06/10/2015   Cervical spondylosis without myelopathy 06/10/2015   DDD (degenerative disc disease), cervical 06/10/2015   S/P total knee arthroplasty 03/25/2014   Arthritis of knee, degenerative 02/26/2014   Prediabetes 02/23/2014   Carpal tunnel syndrome 02/02/2013    Past Medical History:  Diagnosis Date   Allergy    Anemia    Chronic kidney disease    kidney stones   Diabetes mellitus without complication (HCC)    borderline   Osteoarthritis of knee    PONV (postoperative nausea and vomiting)     Past Surgical History:  Procedure Laterality Date   ABDOMINAL HYSTERECTOMY     Partial; one ovary remains   ANKLE SURGERY     with plate   APPENDECTOMY     CHOLECYSTECTOMY     colon resection due to SBO     EYE SURGERY  JOINT REPLACEMENT     2 scopes rt knee   TOTAL KNEE ARTHROPLASTY Right 03/25/2014   Procedure: TOTAL KNEE ARTHROPLASTY;  Surgeon: Vickey Huger, MD;  Location: [facility];  Service: Orthopedics;  Laterality: Right;    Social History   Tobacco Use   Smoking status: Never   Smokeless tobacco: Never  Vaping Use   Vaping Use: Never used  Substance Use Topics   Alcohol use: No    Alcohol/week: 0.0 standard drinks   Drug use: No    Family History  Problem Relation Age of Onset   Heart disease Mother    Rheum arthritis Mother    Alzheimer's disease Mother    Heart disease Father    Diabetes Sister    Heart disease Sister    Hyperlipidemia Sister    Cancer Brother    Heart disease Brother    Hyperlipidemia Brother    Hyperlipidemia Sister     Heart disease Sister    Diabetes Sister     Allergies  Allergen Reactions   Statins     Body aches, has tried several times and does not tolerate well   Contrast Media [Iodinated Contrast Media] Hives, Rash and Other (See Comments)    ALL OVER THE BODY   Ioxaglate Rash and Hives    ALL OVER THE BODY    Medication list has been reviewed and updated.  Current Outpatient Medications on File Prior to Visit  Medication Sig Dispense Refill   celecoxib (CELEBREX) 200 MG capsule Take 1 capsule (200 mg total) by mouth 2 (two) times daily.     cyanocobalamin (,VITAMIN B-12,) 1000 MCG/ML injection Inject 1 mL (1,000 mcg total) into the muscle every 30 (thirty) days. 10 mL 1   Multiple Vitamin (MULTIVITAMIN WITH MINERALS) TABS tablet Take 1 tablet by mouth daily.     omeprazole (PRILOSEC) 40 MG capsule Take 1 capsule by mouth once daily 90 capsule 1   No current facility-administered medications on file prior to visit.    Review of Systems:  As per HPI- otherwise negative.   Physical Examination: Vitals:   08/24/21 0850  BP: 118/74  Pulse: 79  Resp: 18  Temp: 98.3 F (36.8 C)  SpO2: 95%   Vitals:   08/24/21 0850  Weight: 188 lb 6.4 oz (85.5 kg)  Height: 5\' 1"  (1.549 m)   Body mass index is 35.6 kg/m. Ideal Body Weight: Weight in (lb) to have BMI = 25: 132  GEN: no acute distress. Obese, looks well  HEENT: Atraumatic, Normocephalic.  Bilateral TM wnl, oropharynx normal.  PEERL,EOMI.   Ears and Nose: No external deformity. CV: RRR, No M/G/R. No JVD. No thrill. No extra heart sounds. PULM: CTA B, no wheezes, crackles, rhonchi. No retractions. No resp. distress. No accessory muscle use. ABD: S, NT, ND, +BS. No rebound. No HSM. EXTR: No c/c/e PSYCH: Normally interactive. Conversant.  Foot exam- today, normal    Assessment and Plan: Anemia, unspecified type - Plan: CBC  Pre-diabetes - Plan: Basic metabolic panel, Hemoglobin A1c, Microalbumin / creatinine urine  ratio  Dyslipidemia - Plan: Lipid panel  B12 deficiency - Plan: B12  Estrogen deficiency - Plan: DG Bone Density  Immunization due Following up today  Pt has dx of diabetes on her chart.  However on review she had one A1c of 6.5 in 2015 (there may have also been an a1c over 6.5 prior on paper chart)- since then always in pre-diabetes range.  Will change her dx to pre-diabetes  Recommend tetanus booster and 2nd dose shingrix  She has been working on diet and exercise, has lost weight   Signed Lamar Blinks, MD  Received labs as below 2/14- message to pt  Results for orders placed or performed in visit on 20/10/07  Basic metabolic panel  Result Value Ref Range   Sodium 142 135 - 145 mEq/L   Potassium 4.5 3.5 - 5.1 mEq/L   Chloride 108 96 - 112 mEq/L   CO2 29 19 - 32 mEq/L   Glucose, Bld 112 (H) 70 - 99 mg/dL   BUN 19 6 - 23 mg/dL   Creatinine, Ser 0.92 0.40 - 1.20 mg/dL   GFR 63.63 >60.00 mL/min   Calcium 9.8 8.4 - 10.5 mg/dL  CBC  Result Value Ref Range   WBC 4.9 4.0 - 10.5 K/uL   RBC 4.40 3.87 - 5.11 Mil/uL   Platelets 244.0 150.0 - 400.0 K/uL   Hemoglobin 13.0 12.0 - 15.0 g/dL   HCT 39.7 36.0 - 46.0 %   MCV 90.2 78.0 - 100.0 fl   MCHC 32.7 30.0 - 36.0 g/dL   RDW 13.9 11.5 - 15.5 %  Hemoglobin A1c  Result Value Ref Range   Hgb A1c MFr Bld 6.2 4.6 - 6.5 %  Lipid panel  Result Value Ref Range   Cholesterol 267 (H) 0 - 200 mg/dL   Triglycerides 155.0 (H) 0.0 - 149.0 mg/dL   HDL 38.60 (L) >39.00 mg/dL   VLDL 31.0 0.0 - 40.0 mg/dL   LDL Cholesterol 198 (H) 0 - 99 mg/dL   Total CHOL/HDL Ratio 7    NonHDL 228.57   Microalbumin / creatinine urine ratio  Result Value Ref Range   Microalb, Ur 0.7 0.0 - 1.9 mg/dL   Creatinine,U 151.3 mg/dL   Microalb Creat Ratio 0.5 0.0 - 30.0 mg/g  B12  Result Value Ref Range   Vitamin B-12 629 211 - 911 pg/mL

## 2021-08-21 NOTE — Patient Instructions (Addendum)
Good to see you again today!   I will be in touch with your labs- assuming all is well please see me in about 6 months  Please remember annual eye exam  Recommend you get the 2nd dose of shingles vaccine- please send me the dates of your series when you can Tetanus can also be updated at pharmacy

## 2021-08-24 ENCOUNTER — Ambulatory Visit (INDEPENDENT_AMBULATORY_CARE_PROVIDER_SITE_OTHER): Payer: Medicare Other | Admitting: Family Medicine

## 2021-08-24 VITALS — BP 118/74 | HR 79 | Temp 98.3°F | Resp 18 | Ht 61.0 in | Wt 188.4 lb

## 2021-08-24 DIAGNOSIS — D649 Anemia, unspecified: Secondary | ICD-10-CM | POA: Diagnosis not present

## 2021-08-24 DIAGNOSIS — E785 Hyperlipidemia, unspecified: Secondary | ICD-10-CM

## 2021-08-24 DIAGNOSIS — R7303 Prediabetes: Secondary | ICD-10-CM

## 2021-08-24 DIAGNOSIS — E538 Deficiency of other specified B group vitamins: Secondary | ICD-10-CM

## 2021-08-24 DIAGNOSIS — Z23 Encounter for immunization: Secondary | ICD-10-CM | POA: Diagnosis not present

## 2021-08-24 DIAGNOSIS — E2839 Other primary ovarian failure: Secondary | ICD-10-CM | POA: Diagnosis not present

## 2021-08-24 DIAGNOSIS — E119 Type 2 diabetes mellitus without complications: Secondary | ICD-10-CM

## 2021-08-24 LAB — HEMOGLOBIN A1C: Hgb A1c MFr Bld: 6.2 % (ref 4.6–6.5)

## 2021-08-24 LAB — CBC
HCT: 39.7 % (ref 36.0–46.0)
Hemoglobin: 13 g/dL (ref 12.0–15.0)
MCHC: 32.7 g/dL (ref 30.0–36.0)
MCV: 90.2 fl (ref 78.0–100.0)
Platelets: 244 10*3/uL (ref 150.0–400.0)
RBC: 4.4 Mil/uL (ref 3.87–5.11)
RDW: 13.9 % (ref 11.5–15.5)
WBC: 4.9 10*3/uL (ref 4.0–10.5)

## 2021-08-24 LAB — VITAMIN B12: Vitamin B-12: 629 pg/mL (ref 211–911)

## 2021-08-24 LAB — MICROALBUMIN / CREATININE URINE RATIO
Creatinine,U: 151.3 mg/dL
Microalb Creat Ratio: 0.5 mg/g (ref 0.0–30.0)
Microalb, Ur: 0.7 mg/dL (ref 0.0–1.9)

## 2021-08-25 ENCOUNTER — Encounter: Payer: Self-pay | Admitting: Family Medicine

## 2021-08-25 DIAGNOSIS — M25551 Pain in right hip: Secondary | ICD-10-CM | POA: Diagnosis not present

## 2021-08-25 DIAGNOSIS — M25552 Pain in left hip: Secondary | ICD-10-CM | POA: Diagnosis not present

## 2021-08-25 LAB — BASIC METABOLIC PANEL
BUN: 19 mg/dL (ref 6–23)
CO2: 29 mEq/L (ref 19–32)
Calcium: 9.8 mg/dL (ref 8.4–10.5)
Chloride: 108 mEq/L (ref 96–112)
Creatinine, Ser: 0.92 mg/dL (ref 0.40–1.20)
GFR: 63.63 mL/min (ref >60.00)
Glucose, Bld: 112 mg/dL — ABNORMAL HIGH (ref 70–99)
Potassium: 4.5 mEq/L (ref 3.5–5.1)
Sodium: 142 mEq/L (ref 135–145)

## 2021-08-25 LAB — LIPID PANEL
Cholesterol: 267 mg/dL — ABNORMAL HIGH (ref 0–200)
HDL: 38.6 mg/dL — ABNORMAL LOW (ref >39.00)
LDL Cholesterol: 198 mg/dL — ABNORMAL HIGH (ref 0–99)
NonHDL: 228.57
Total CHOL/HDL Ratio: 7
Triglycerides: 155 mg/dL — ABNORMAL HIGH (ref 0.0–149.0)
VLDL: 31 mg/dL (ref 0.0–40.0)

## 2021-08-27 DIAGNOSIS — M25552 Pain in left hip: Secondary | ICD-10-CM | POA: Diagnosis not present

## 2021-08-27 DIAGNOSIS — M25551 Pain in right hip: Secondary | ICD-10-CM | POA: Diagnosis not present

## 2021-08-27 MED ORDER — ROSUVASTATIN CALCIUM 5 MG PO TABS: 5.0000 mg | ORAL_TABLET | Freq: Every day | ORAL | 3 refills | Status: DC

## 2021-08-29 ENCOUNTER — Telehealth (HOSPITAL_BASED_OUTPATIENT_CLINIC_OR_DEPARTMENT_OTHER): Payer: Self-pay

## 2021-09-03 ENCOUNTER — Ambulatory Visit (HOSPITAL_BASED_OUTPATIENT_CLINIC_OR_DEPARTMENT_OTHER)
Admission: RE | Admit: 2021-09-03 | Discharge: 2021-09-03 | Disposition: A | Discharge status: Home / Self Care | Payer: Medicare Other | Source: Ambulatory Visit

## 2021-09-03 ENCOUNTER — Ambulatory Visit (HOSPITAL_BASED_OUTPATIENT_CLINIC_OR_DEPARTMENT_OTHER): Payer: Medicare Other

## 2021-09-03 ENCOUNTER — Ambulatory Visit (HOSPITAL_BASED_OUTPATIENT_CLINIC_OR_DEPARTMENT_OTHER)
Admission: RE | Admit: 2021-09-03 | Discharge: 2021-09-03 | Disposition: A | Discharge status: Home / Self Care | Payer: Medicare Other | Source: Ambulatory Visit | Attending: Family Medicine | Admitting: Family Medicine

## 2021-09-03 ENCOUNTER — Encounter (HOSPITAL_BASED_OUTPATIENT_CLINIC_OR_DEPARTMENT_OTHER): Payer: Self-pay

## 2021-09-03 ENCOUNTER — Other Ambulatory Visit: Payer: Self-pay

## 2021-09-03 DIAGNOSIS — E2839 Other primary ovarian failure: Secondary | ICD-10-CM | POA: Insufficient documentation

## 2021-09-03 DIAGNOSIS — Z1231 Encounter for screening mammogram for malignant neoplasm of breast: Secondary | ICD-10-CM | POA: Diagnosis not present

## 2021-09-03 DIAGNOSIS — M85851 Other specified disorders of bone density and structure, right thigh: Secondary | ICD-10-CM | POA: Diagnosis not present

## 2021-09-03 DIAGNOSIS — Z780 Asymptomatic menopausal state: Secondary | ICD-10-CM | POA: Diagnosis not present

## 2021-09-04 ENCOUNTER — Encounter: Payer: Self-pay | Admitting: Family Medicine

## 2021-09-04 DIAGNOSIS — R7303 Prediabetes: Secondary | ICD-10-CM | POA: Diagnosis not present

## 2021-09-04 DIAGNOSIS — M25552 Pain in left hip: Secondary | ICD-10-CM | POA: Diagnosis not present

## 2021-09-04 DIAGNOSIS — H5203 Hypermetropia, bilateral: Secondary | ICD-10-CM | POA: Diagnosis not present

## 2021-09-04 DIAGNOSIS — H2513 Age-related nuclear cataract, bilateral: Secondary | ICD-10-CM | POA: Diagnosis not present

## 2021-09-04 DIAGNOSIS — M25551 Pain in right hip: Secondary | ICD-10-CM | POA: Diagnosis not present

## 2021-09-04 DIAGNOSIS — H524 Presbyopia: Secondary | ICD-10-CM | POA: Diagnosis not present

## 2021-09-04 LAB — HM DIABETES EYE EXAM

## 2021-09-07 ENCOUNTER — Ambulatory Visit (HOSPITAL_BASED_OUTPATIENT_CLINIC_OR_DEPARTMENT_OTHER): Payer: Medicare Other

## 2021-09-09 DIAGNOSIS — M25551 Pain in right hip: Secondary | ICD-10-CM | POA: Diagnosis not present

## 2021-09-09 DIAGNOSIS — M25552 Pain in left hip: Secondary | ICD-10-CM | POA: Diagnosis not present

## 2021-09-18 DIAGNOSIS — M25551 Pain in right hip: Secondary | ICD-10-CM | POA: Diagnosis not present

## 2021-09-18 DIAGNOSIS — M25552 Pain in left hip: Secondary | ICD-10-CM | POA: Diagnosis not present

## 2021-09-30 ENCOUNTER — Other Ambulatory Visit: Payer: Self-pay | Admitting: Family Medicine

## 2021-09-30 DIAGNOSIS — K219 Gastro-esophageal reflux disease without esophagitis: Secondary | ICD-10-CM

## 2021-11-24 NOTE — Progress Notes (Addendum)
[facility] ?[address], [address] ?[place], [place] [address] ?336 [phone] ?Fax [phone] ? ?Date:  11/25/2021  ? ?Name:  Peggy Santiago   DOB:  [birth year]   MRN:  [id number] ? ?PCP:  Darreld Mclean, MD  ? ? ?Chief Complaint: Hemorrhoids (Pt c/o minor rectal bleeding. Pt says she was informed on her last Cololoscopy that she has Hemorid's . No pain, itching or burning. ) ? ? ?History of Present Illness: ? ?Peggy Santiago is a 71 y.o. very pleasant female patient who presents with the following: ? ?Patient seen today with concern of rectal bleeding/possible hemorrhoids ?Most recent visit with myself was in February ?History of osteopenia, dyslipidemia, prediabetes, migraine headache, cervical spine disease, B12 deficiency  ? ?Most recent colonoscopy was in 2020, 5-year follow-up ?Pt reports she was told she had hemorrhoids at that time and was offered surgery for treatment ?Lab work in February showed resolution of anemia noted in December ? ?She has noted bright red blood per rectum for about 6 months ?It has gotten worse the last 3 weeks- not daily but more frequent ?Will occur with BM-blood does not seem mixed in the stool but is on the paper or might drip into the bowl ?No pain- she does not think she has a tear ?Admits she has been a bit more constipated ?No melena ?No belly pain- she is a bit sore but she thinks this is her abs due to exercise  ? ?She continues to take PPI ?She is status postcholecystectomy ?Lab Results  ?Component Value Date  ? HGBA1C 6.2 08/24/2021  ? ? ?Patient Active Problem List  ? Diagnosis Date Noted  ? Atypical chest pain 06/26/2021  ? Flank pain 06/26/2021  ? Osteopenia 07/02/2017  ? Dyslipidemia 06/15/2016  ? Pernicious anemia 06/10/2015  ? Allergic rhinitis 06/10/2015  ? IBS (irritable bowel syndrome) 06/10/2015  ? Nephrolithiasis 06/10/2015  ? Migraine 06/10/2015  ? Cervical spondylosis without myelopathy 06/10/2015  ? DDD  (degenerative disc disease), cervical 06/10/2015  ? S/P total knee arthroplasty 03/25/2014  ? Arthritis of knee, degenerative 02/26/2014  ? Prediabetes 02/23/2014  ? Carpal tunnel syndrome 02/02/2013  ? ? ?Past Medical History:  ?Diagnosis Date  ? Allergy   ? Anemia   ? Chronic kidney disease   ? kidney stones  ? Diabetes mellitus without complication ([facility])   ? borderline  ? Osteoarthritis of knee   ? PONV (postoperative nausea and vomiting)   ? ? ?Past Surgical History:  ?Procedure Laterality Date  ? ABDOMINAL HYSTERECTOMY    ? Partial; one ovary remains  ? ANKLE SURGERY    ? with plate  ? APPENDECTOMY    ? CHOLECYSTECTOMY    ? colon resection due to SBO    ? EYE SURGERY    ? JOINT REPLACEMENT    ? 2 scopes rt knee  ? TOTAL KNEE ARTHROPLASTY Right 03/25/2014  ? Procedure: TOTAL KNEE ARTHROPLASTY;  Surgeon: Vickey Huger, MD;  Location: [facility];  Service: Orthopedics;  Laterality: Right;  ? ? ?Social History  ? ?Tobacco Use  ? Smoking status: Never  ? Smokeless tobacco: Never  ?Vaping Use  ? Vaping Use: Never used  ?Substance Use Topics  ? Alcohol use: No  ?  Alcohol/week: 0.0 standard drinks  ? Drug use: No  ? ? ?Family History  ?Problem Relation Age of Onset  ? Heart disease Mother   ? Rheum arthritis Mother   ? Alzheimer's  disease Mother   ? Heart disease Father   ? Diabetes Sister   ? Heart disease Sister   ? Hyperlipidemia Sister   ? Cancer Brother   ? Heart disease Brother   ? Hyperlipidemia Brother   ? Hyperlipidemia Sister   ? Heart disease Sister   ? Diabetes Sister   ? ? ?Allergies  ?Allergen Reactions  ? Statins   ?  Body aches, has tried several times and does not tolerate well  ? Contrast Media [Iodinated Contrast Media] Hives, Rash and Other (See Comments)  ?  ALL OVER THE BODY  ? Ioxaglate Rash and Hives  ?  ALL OVER THE BODY  ? ? ?Medication list has been reviewed and updated. ? ?Current Outpatient Medications on File Prior to Visit  ?Medication Sig Dispense Refill  ? celecoxib (CELEBREX) 200 MG capsule  Take 1 capsule (200 mg total) by mouth 2 (two) times daily.    ? cyanocobalamin (,VITAMIN B-12,) 1000 MCG/ML injection Inject 1 mL (1,000 mcg total) into the muscle every 30 (thirty) days. 10 mL 1  ? Multiple Vitamin (MULTIVITAMIN WITH MINERALS) TABS tablet Take 1 tablet by mouth daily.    ? omeprazole (PRILOSEC) 40 MG capsule Take 1 capsule by mouth once daily 90 capsule 1  ? rosuvastatin (CRESTOR) 5 MG tablet Take 1 tablet (5 mg total) by mouth daily. 30 tablet 3  ? ?No current facility-administered medications on file prior to visit.  ? ? ?Review of Systems: ? ?As per HPI- otherwise negative. ? ? ?Physical Examination: ?Vitals:  ? 11/25/21 1046  ?BP: 122/80  ?Pulse: 68  ?Resp: 18  ?Temp: 97.8 ?F (36.6 ?C)  ?SpO2: 97%  ? ?There were no vitals filed for this visit. ?There is no height or weight on file to calculate BMI. ?Ideal Body Weight:   ? ?GEN: no acute distress.  Mild obesity, looks well ?HEENT: Atraumatic, Normocephalic.  ?Ears and Nose: No external deformity. ?CV: RRR, No M/G/R. No JVD. No thrill. No extra heart sounds. ?PULM: CTA B, no wheezes, crackles, rhonchi. No retractions. No resp. distress. No accessory muscle use. ?ABD: S, ND, +BS. No rebound. No HSM.  Mild epigastric tenderness is present ?EXTR: No c/c/e ?PSYCH: Normally interactive. Conversant.  ?Rectal exam today is normal.  No external hemorrhoids are noted.  No gross blood on exam ? ?Assessment and Plan: ?Bright red blood per rectum - Plan: CBC ? ?Epigastric pain - Plan: sucralfate (CARAFATE) 1 g tablet, Comprehensive metabolic panel, CBC ? ?Prediabetes ? ?Leg cramps - Plan: cyclobenzaprine (FLEXERIL) 10 MG tablet ? ?Patient seen today with concern of bright red blood per rectum.  From her description this does sound like hemorrhoids.  Labs are pending as above.  I encouraged her to try stool softener to maintain soft stools which may cut down on bleeding.  However, given several months of bleeding I did ask her to call her gastroenterologist  and arrange a follow-up appointment ASAP.  She agrees to do so, will let me know if any worsening in the meantime ? ?Added Carafate for 10 days for epigastric pain-continue PPI ?Alert me if worse ? ?She and her husband are taking a 50th anniversary trip to [place] this fall!  She is worried about getting leg cramps on the long plane ride.  Provided Flexeril to use as needed ? ?Signed ?Lamar Blinks, MD ? ?Received labs as below, message to patient ?Results for orders placed or performed in visit on 11/25/21  ?Comprehensive metabolic panel  ?Result  Value Ref Range  ? Sodium 140 135 - 145 mEq/L  ? Potassium 4.2 3.5 - 5.1 mEq/L  ? Chloride 105 96 - 112 mEq/L  ? CO2 26 19 - 32 mEq/L  ? Glucose, Bld 103 (H) 70 - 99 mg/dL  ? BUN 19 6 - 23 mg/dL  ? Creatinine, Ser 0.93 0.40 - 1.20 mg/dL  ? Total Bilirubin 0.6 0.2 - 1.2 mg/dL  ? Alkaline Phosphatase 67 39 - 117 U/L  ? AST 16 0 - 37 U/L  ? ALT 18 0 - 35 U/L  ? Total Protein 7.1 6.0 - 8.3 g/dL  ? Albumin 4.3 3.5 - 5.2 g/dL  ? GFR 62.70 >60.00 mL/min  ? Calcium 9.7 8.4 - 10.5 mg/dL  ?CBC  ?Result Value Ref Range  ? WBC 6.3 4.0 - 10.5 K/uL  ? RBC 4.29 3.87 - 5.11 Mil/uL  ? Platelets 261.0 150.0 - 400.0 K/uL  ? Hemoglobin 12.9 12.0 - 15.0 g/dL  ? HCT 39.2 36.0 - 46.0 %  ? MCV 91.5 78.0 - 100.0 fl  ? MCHC 32.8 30.0 - 36.0 g/dL  ? RDW 13.4 11.5 - 15.5 %  ? ? ? ?

## 2021-11-25 ENCOUNTER — Encounter: Payer: Self-pay | Admitting: Family Medicine

## 2021-11-25 ENCOUNTER — Ambulatory Visit (INDEPENDENT_AMBULATORY_CARE_PROVIDER_SITE_OTHER): Payer: Medicare Other | Admitting: Family Medicine

## 2021-11-25 VITALS — BP 122/80 | HR 68 | Temp 97.8°F | Resp 18 | Ht 62.0 in | Wt 188.6 lb

## 2021-11-25 DIAGNOSIS — R1013 Epigastric pain: Secondary | ICD-10-CM | POA: Diagnosis not present

## 2021-11-25 DIAGNOSIS — K625 Hemorrhage of anus and rectum: Secondary | ICD-10-CM

## 2021-11-25 DIAGNOSIS — R7303 Prediabetes: Secondary | ICD-10-CM | POA: Diagnosis not present

## 2021-11-25 DIAGNOSIS — R252 Cramp and spasm: Secondary | ICD-10-CM

## 2021-11-25 LAB — COMPREHENSIVE METABOLIC PANEL
ALT: 18 U/L (ref 0–35)
AST: 16 U/L (ref 0–37)
Albumin: 4.3 g/dL (ref 3.5–5.2)
Alkaline Phosphatase: 67 U/L (ref 39–117)
BUN: 19 mg/dL (ref 6–23)
CO2: 26 mEq/L (ref 19–32)
Calcium: 9.7 mg/dL (ref 8.4–10.5)
Chloride: 105 mEq/L (ref 96–112)
Creatinine, Ser: 0.93 mg/dL (ref 0.40–1.20)
GFR: 62.7 mL/min (ref >60.00)
Glucose, Bld: 103 mg/dL — ABNORMAL HIGH (ref 70–99)
Potassium: 4.2 mEq/L (ref 3.5–5.1)
Sodium: 140 mEq/L (ref 135–145)
Total Bilirubin: 0.6 mg/dL (ref 0.2–1.2)
Total Protein: 7.1 g/dL (ref 6.0–8.3)

## 2021-11-25 LAB — CBC
HCT: 39.2 % (ref 36.0–46.0)
Hemoglobin: 12.9 g/dL (ref 12.0–15.0)
MCHC: 32.8 g/dL (ref 30.0–36.0)
MCV: 91.5 fl (ref 78.0–100.0)
Platelets: 261 10*3/uL (ref 150.0–400.0)
RBC: 4.29 Mil/uL (ref 3.87–5.11)
RDW: 13.4 % (ref 11.5–15.5)
WBC: 6.3 10*3/uL (ref 4.0–10.5)

## 2021-11-25 MED ORDER — SUCRALFATE 1 G PO TABS: 1.0000 g | ORAL_TABLET | Freq: Three times a day (TID) | ORAL | 0 refills | Status: DC

## 2021-11-25 MED ORDER — CYCLOBENZAPRINE HCL 10 MG PO TABS
10.0000 mg | ORAL_TABLET | Freq: Two times a day (BID) | ORAL | 0 refills | Status: AC | PRN
Start: 2021-11-25 — End: ?

## 2021-11-25 NOTE — Patient Instructions (Addendum)
It was good to see you today- have a great summer!  We will check your blood counts today ?Start on a stool softener to reduce constipation and hopefully reduce bleeding ?Please do call your GI doc and get in for a visit ?For your stomach we will try carafate for 10 days- continue prilosec  ?Try to avoid NSAIDs for now- tylenol ok  ? ?I gave you some flexeril/ muscle relaxer to use on your [place]- try it out at home first!  Will make you drowsy ?Start with a 1/2 tablet  ?

## 2021-12-16 ENCOUNTER — Other Ambulatory Visit: Payer: Self-pay | Admitting: Family Medicine

## 2021-12-16 ENCOUNTER — Encounter: Payer: Self-pay | Admitting: Family Medicine

## 2021-12-16 DIAGNOSIS — M7061 Trochanteric bursitis, right hip: Secondary | ICD-10-CM

## 2022-02-05 DIAGNOSIS — M76892 Other specified enthesopathies of left lower limb, excluding foot: Secondary | ICD-10-CM | POA: Diagnosis not present

## 2022-02-05 DIAGNOSIS — M7062 Trochanteric bursitis, left hip: Secondary | ICD-10-CM | POA: Diagnosis not present

## 2022-02-05 DIAGNOSIS — E669 Obesity, unspecified: Secondary | ICD-10-CM | POA: Diagnosis not present

## 2022-02-05 DIAGNOSIS — G5601 Carpal tunnel syndrome, right upper limb: Secondary | ICD-10-CM | POA: Diagnosis not present

## 2022-02-05 DIAGNOSIS — M7061 Trochanteric bursitis, right hip: Secondary | ICD-10-CM | POA: Diagnosis not present

## 2022-02-05 DIAGNOSIS — Z6835 Body mass index (BMI) 35.0-35.9, adult: Secondary | ICD-10-CM | POA: Diagnosis not present

## 2022-02-05 DIAGNOSIS — M1991 Primary osteoarthritis, unspecified site: Secondary | ICD-10-CM | POA: Diagnosis not present

## 2022-02-15 ENCOUNTER — Other Ambulatory Visit: Payer: Self-pay | Admitting: Family Medicine

## 2022-02-15 DIAGNOSIS — D510 Vitamin B12 deficiency anemia due to intrinsic factor deficiency: Secondary | ICD-10-CM

## 2022-04-09 ENCOUNTER — Other Ambulatory Visit: Payer: Self-pay | Admitting: Family Medicine

## 2022-04-09 DIAGNOSIS — K219 Gastro-esophageal reflux disease without esophagitis: Secondary | ICD-10-CM

## 2022-05-09 ENCOUNTER — Other Ambulatory Visit: Payer: Self-pay | Admitting: Family Medicine

## 2022-05-09 DIAGNOSIS — D510 Vitamin B12 deficiency anemia due to intrinsic factor deficiency: Secondary | ICD-10-CM

## 2022-07-07 ENCOUNTER — Other Ambulatory Visit: Payer: Self-pay | Admitting: Family Medicine

## 2022-07-07 DIAGNOSIS — K219 Gastro-esophageal reflux disease without esophagitis: Secondary | ICD-10-CM

## 2022-07-14 ENCOUNTER — Other Ambulatory Visit: Payer: Self-pay

## 2022-07-14 MED ORDER — CELECOXIB 200 MG PO CAPS: 200.0000 mg | ORAL_CAPSULE | Freq: Two times a day (BID) | ORAL | 0 refills | Status: DC

## 2022-07-22 ENCOUNTER — Other Ambulatory Visit: Payer: Self-pay | Admitting: *Deleted

## 2022-07-22 MED ORDER — CELECOXIB 200 MG PO CAPS: 200.0000 mg | ORAL_CAPSULE | Freq: Two times a day (BID) | ORAL | 0 refills | Status: DC

## 2022-08-23 NOTE — Patient Instructions (Incomplete)
Good to see you again today! Mammo due this month Due for- flu, covid booster, tetanus, shingles vaccines - recommend that you have these done at your pharmacy   Look into GLP-1 coverage for weight loss- if you can get one of these paid for I am glad to order for you  Stop by the imaging dept on the ground floor to schedule your CT coronary and your mammogram

## 2022-08-23 NOTE — Progress Notes (Unsigned)
[facility] [address] [place], [place] [address] [phone]  Date:  08/25/2022   Name:  Peggy Santiago   DOB:  [birth year]   MRN:  [id number]  PCP:  Darreld Mclean, MD    Chief Complaint: No chief complaint on file.   History of Present Illness:  Peggy Santiago is a 72 y.o. very pleasant female patient who presents with the following:  Pt seen today for an annual exam- medicare  Last visit with myself in May - at that time she had some BRBPR and I asked her to see her GI doc  History of osteopenia, dyslipidemia, prediabetes, migraine headache, cervical spine disease, B12 deficiency   Shingrix Covid booster Tetanus due Flu shot Update A1c Labs done in May- CMP, CBC only Dexa one year ago Colon 2020 Mammo can be updated this month    Can offer coronary calcium    Lab Results  Component Value Date   HGBA1C 6.2 08/24/2021     Patient Active Problem List   Diagnosis Date Noted   Atypical chest pain 06/26/2021   Flank pain 06/26/2021   Osteopenia 07/02/2017   Dyslipidemia 06/15/2016   Pernicious anemia 06/10/2015   Allergic rhinitis 06/10/2015   IBS (irritable bowel syndrome) 06/10/2015   Nephrolithiasis 06/10/2015   Migraine 06/10/2015   Cervical spondylosis without myelopathy 06/10/2015   DDD (degenerative disc disease), cervical 06/10/2015   S/P total knee arthroplasty 03/25/2014   Arthritis of knee, degenerative 02/26/2014   Prediabetes 02/23/2014   Carpal tunnel syndrome 02/02/2013    Past Medical History:  Diagnosis Date   Allergy    Anemia    Chronic kidney disease    kidney stones   Diabetes mellitus without complication (HCC)    borderline   Osteoarthritis of knee    PONV (postoperative nausea and vomiting)     Past Surgical History:  Procedure Laterality Date   ABDOMINAL HYSTERECTOMY     Partial; one ovary remains   ANKLE SURGERY     with plate   APPENDECTOMY      CHOLECYSTECTOMY     colon resection due to SBO     EYE SURGERY     JOINT REPLACEMENT     2 scopes rt knee   TOTAL KNEE ARTHROPLASTY Right 03/25/2014   Procedure: TOTAL KNEE ARTHROPLASTY;  Surgeon: Vickey Huger, MD;  Location: [facility];  Service: Orthopedics;  Laterality: Right;    Social History   Tobacco Use   Smoking status: Never   Smokeless tobacco: Never  Vaping Use   Vaping Use: Never used  Substance Use Topics   Alcohol use: No    Alcohol/week: 0.0 standard drinks of alcohol   Drug use: No    Family History  Problem Relation Age of Onset   Heart disease Mother    Rheum arthritis Mother    Alzheimer's disease Mother    Heart disease Father    Diabetes Sister    Heart disease Sister    Hyperlipidemia Sister    Cancer Brother    Heart disease Brother    Hyperlipidemia Brother    Hyperlipidemia Sister    Heart disease Sister    Diabetes Sister     Allergies  Allergen Reactions   Statins     Body aches, has tried several times and does not tolerate well   Contrast Media [Iodinated Contrast Media] Hives, Rash and Other (See Comments)  ALL OVER THE BODY   Ioxaglate Rash and Hives    ALL OVER THE BODY    Medication list has been reviewed and updated.  Current Outpatient Medications on File Prior to Visit  Medication Sig Dispense Refill   celecoxib (CELEBREX) 200 MG capsule Take 1 capsule (200 mg total) by mouth 2 (two) times daily. 60 capsule 0   cyanocobalamin (VITAMIN B12) 1000 MCG/ML injection Inject 1 mL (1,000 mcg total) into the muscle every 30 (thirty) days. 3 mL 0   cyclobenzaprine (FLEXERIL) 10 MG tablet Take 1 tablet (10 mg total) by mouth 2 (two) times daily as needed for muscle spasms. 15 tablet 0   Multiple Vitamin (MULTIVITAMIN WITH MINERALS) TABS tablet Take 1 tablet by mouth daily.     omeprazole (PRILOSEC) 40 MG capsule Take 1 capsule by mouth once daily 90 capsule 0   rosuvastatin (CRESTOR) 5 MG tablet Take 1 tablet (5 mg total) by mouth  daily. 30 tablet 3   sucralfate (CARAFATE) 1 g tablet Take 1 tablet (1 g total) by mouth 4 (four) times daily -  with meals and at bedtime. 40 tablet 0   No current facility-administered medications on file prior to visit.    Review of Systems:  As per HPI- otherwise negative.   Physical Examination: There were no vitals filed for this visit. There were no vitals filed for this visit. There is no height or weight on file to calculate BMI. Ideal Body Weight:    GEN: no acute distress. HEENT: Atraumatic, Normocephalic.  Ears and Nose: No external deformity. CV: RRR, No M/G/R. No JVD. No thrill. No extra heart sounds. PULM: CTA B, no wheezes, crackles, rhonchi. No retractions. No resp. distress. No accessory muscle use. ABD: S, NT, ND, +BS. No rebound. No HSM. EXTR: No c/c/e PSYCH: Normally interactive. Conversant.    Assessment and Plan: ***  Signed Lamar Blinks, MD

## 2022-08-25 ENCOUNTER — Encounter: Payer: Self-pay | Admitting: Family Medicine

## 2022-08-25 ENCOUNTER — Telehealth: Payer: Self-pay

## 2022-08-25 ENCOUNTER — Ambulatory Visit (INDEPENDENT_AMBULATORY_CARE_PROVIDER_SITE_OTHER): Payer: Medicare Other | Admitting: Family Medicine

## 2022-08-25 VITALS — BP 120/80 | HR 56 | Temp 97.8°F | Resp 18 | Ht 62.0 in | Wt 190.8 lb

## 2022-08-25 DIAGNOSIS — R7303 Prediabetes: Secondary | ICD-10-CM

## 2022-08-25 DIAGNOSIS — E785 Hyperlipidemia, unspecified: Secondary | ICD-10-CM | POA: Diagnosis not present

## 2022-08-25 DIAGNOSIS — E669 Obesity, unspecified: Secondary | ICD-10-CM | POA: Diagnosis not present

## 2022-08-25 DIAGNOSIS — M7062 Trochanteric bursitis, left hip: Secondary | ICD-10-CM

## 2022-08-25 DIAGNOSIS — Z130 Encounter for screening for diseases of the blood and blood-forming organs and certain disorders involving the immune mechanism: Secondary | ICD-10-CM

## 2022-08-25 DIAGNOSIS — Z1231 Encounter for screening mammogram for malignant neoplasm of breast: Secondary | ICD-10-CM | POA: Diagnosis not present

## 2022-08-25 DIAGNOSIS — Z1329 Encounter for screening for other suspected endocrine disorder: Secondary | ICD-10-CM

## 2022-08-25 DIAGNOSIS — Z5181 Encounter for therapeutic drug level monitoring: Secondary | ICD-10-CM | POA: Diagnosis not present

## 2022-08-25 LAB — COMPREHENSIVE METABOLIC PANEL
ALT: 16 U/L (ref 0–35)
AST: 16 U/L (ref 0–37)
Albumin: 4.3 g/dL (ref 3.5–5.2)
Alkaline Phosphatase: 70 U/L (ref 39–117)
BUN: 14 mg/dL (ref 6–23)
CO2: 26 mEq/L (ref 19–32)
Calcium: 9.9 mg/dL (ref 8.4–10.5)
Chloride: 106 mEq/L (ref 96–112)
Creatinine, Ser: 0.88 mg/dL (ref 0.40–1.20)
GFR: 66.65 mL/min (ref >60.00)
Glucose, Bld: 107 mg/dL — ABNORMAL HIGH (ref 70–99)
Potassium: 4.4 mEq/L (ref 3.5–5.1)
Sodium: 140 mEq/L (ref 135–145)
Total Bilirubin: 0.8 mg/dL (ref 0.2–1.2)
Total Protein: 7.4 g/dL (ref 6.0–8.3)

## 2022-08-25 LAB — CBC
HCT: 39.8 % (ref 36.0–46.0)
Hemoglobin: 13.4 g/dL (ref 12.0–15.0)
MCHC: 33.6 g/dL (ref 30.0–36.0)
MCV: 90.1 fl (ref 78.0–100.0)
Platelets: 291 10*3/uL (ref 150.0–400.0)
RBC: 4.41 Mil/uL (ref 3.87–5.11)
RDW: 13.7 % (ref 11.5–15.5)
WBC: 6.6 10*3/uL (ref 4.0–10.5)

## 2022-08-25 LAB — LIPID PANEL
Cholesterol: 259 mg/dL — ABNORMAL HIGH (ref 0–200)
HDL: 35.7 mg/dL — ABNORMAL LOW (ref >39.00)
NonHDL: 223.02
Total CHOL/HDL Ratio: 7
Triglycerides: 232 mg/dL — ABNORMAL HIGH (ref 0.0–149.0)
VLDL: 46.4 mg/dL — ABNORMAL HIGH (ref 0.0–40.0)

## 2022-08-25 LAB — TSH: TSH: 1.15 u[IU]/mL (ref 0.35–5.50)

## 2022-08-25 LAB — HEMOGLOBIN A1C: Hgb A1c MFr Bld: 6.7 % — ABNORMAL HIGH (ref 4.6–6.5)

## 2022-08-25 LAB — LDL CHOLESTEROL, DIRECT: Direct LDL: 161 mg/dL

## 2022-08-25 MED ORDER — CYCLOBENZAPRINE HCL 10 MG PO TABS
10.0000 mg | ORAL_TABLET | Freq: Two times a day (BID) | ORAL | 2 refills | Status: DC | PRN
Start: 2022-08-25 — End: 2023-02-17

## 2022-08-25 MED ORDER — PREDNISONE 20 MG PO TABS: ORAL_TABLET | ORAL | 0 refills | Status: DC

## 2022-08-25 NOTE — Telephone Encounter (Signed)
PA approved. Effective 07/12/2022 - 11/23/2022.

## 2022-08-25 NOTE — Telephone Encounter (Signed)
PA initiated via Covermymeds; KEY: [id number]. Awaiting determination.

## 2022-09-06 ENCOUNTER — Ambulatory Visit (HOSPITAL_BASED_OUTPATIENT_CLINIC_OR_DEPARTMENT_OTHER)
Admission: RE | Admit: 2022-09-06 | Discharge: 2022-09-06 | Disposition: A | Discharge status: Home / Self Care | Payer: Self-pay | Source: Ambulatory Visit | Attending: Family Medicine | Admitting: Family Medicine

## 2022-09-06 ENCOUNTER — Ambulatory Visit (HOSPITAL_BASED_OUTPATIENT_CLINIC_OR_DEPARTMENT_OTHER)
Admission: RE | Admit: 2022-09-06 | Discharge: 2022-09-06 | Disposition: A | Discharge status: Home / Self Care | Payer: Medicare Other | Source: Ambulatory Visit | Attending: Family Medicine | Admitting: Family Medicine

## 2022-09-06 ENCOUNTER — Encounter (HOSPITAL_BASED_OUTPATIENT_CLINIC_OR_DEPARTMENT_OTHER): Payer: Self-pay

## 2022-09-06 DIAGNOSIS — Z1231 Encounter for screening mammogram for malignant neoplasm of breast: Secondary | ICD-10-CM | POA: Diagnosis not present

## 2022-09-06 DIAGNOSIS — E785 Hyperlipidemia, unspecified: Secondary | ICD-10-CM | POA: Insufficient documentation

## 2022-09-07 ENCOUNTER — Encounter: Payer: Self-pay | Admitting: Family Medicine

## 2022-09-08 ENCOUNTER — Encounter: Payer: Self-pay | Admitting: Family Medicine

## 2022-09-15 ENCOUNTER — Other Ambulatory Visit: Payer: Self-pay | Admitting: Family Medicine

## 2022-09-15 DIAGNOSIS — R7303 Prediabetes: Secondary | ICD-10-CM | POA: Diagnosis not present

## 2022-09-15 DIAGNOSIS — H2513 Age-related nuclear cataract, bilateral: Secondary | ICD-10-CM | POA: Diagnosis not present

## 2022-09-15 DIAGNOSIS — H0100A Unspecified blepharitis right eye, upper and lower eyelids: Secondary | ICD-10-CM | POA: Diagnosis not present

## 2022-09-15 DIAGNOSIS — D510 Vitamin B12 deficiency anemia due to intrinsic factor deficiency: Secondary | ICD-10-CM

## 2022-09-15 DIAGNOSIS — H5203 Hypermetropia, bilateral: Secondary | ICD-10-CM | POA: Diagnosis not present

## 2022-09-15 DIAGNOSIS — H524 Presbyopia: Secondary | ICD-10-CM | POA: Diagnosis not present

## 2022-09-15 DIAGNOSIS — H0100B Unspecified blepharitis left eye, upper and lower eyelids: Secondary | ICD-10-CM | POA: Diagnosis not present

## 2022-09-15 LAB — HM DIABETES EYE EXAM

## 2022-09-22 ENCOUNTER — Encounter: Payer: Self-pay | Admitting: Family Medicine

## 2022-09-22 ENCOUNTER — Other Ambulatory Visit (INDEPENDENT_AMBULATORY_CARE_PROVIDER_SITE_OTHER): Payer: Medicare Other

## 2022-09-22 DIAGNOSIS — R7303 Prediabetes: Secondary | ICD-10-CM | POA: Diagnosis not present

## 2022-09-22 LAB — HEMOGLOBIN A1C: Hgb A1c MFr Bld: 6.6 % — ABNORMAL HIGH (ref 4.6–6.5)

## 2022-09-30 ENCOUNTER — Other Ambulatory Visit: Payer: Self-pay | Admitting: Family Medicine

## 2022-09-30 DIAGNOSIS — K219 Gastro-esophageal reflux disease without esophagitis: Secondary | ICD-10-CM

## 2022-11-30 ENCOUNTER — Encounter: Payer: Self-pay | Admitting: Family Medicine

## 2022-11-30 NOTE — Telephone Encounter (Signed)
Please see the MyChart message reply(ies) for my assessment and plan.  The patient gave consent for this Medical Advice Message and is aware that it may result in a bill to their insurance company as well as the possibility that this may result in a co-payment or deductible. They are an established patient, but are not seeking medical advice exclusively about a problem treated during an in person or video visit in the last 7 days. I did not recommend an in person or video visit within 7 days of my reply.  I spent a total of 10 minutes cumulative time within 7 days through MyChart messaging Alitzel Cookson, MD  

## 2022-12-10 ENCOUNTER — Other Ambulatory Visit: Payer: Self-pay | Admitting: Family Medicine

## 2022-12-14 ENCOUNTER — Encounter: Payer: Self-pay | Admitting: Dermatology

## 2022-12-14 ENCOUNTER — Ambulatory Visit (INDEPENDENT_AMBULATORY_CARE_PROVIDER_SITE_OTHER): Payer: Medicare Other | Admitting: Dermatology

## 2022-12-14 VITALS — BP 124/84 | HR 62

## 2022-12-14 DIAGNOSIS — X32XXXA Exposure to sunlight, initial encounter: Secondary | ICD-10-CM | POA: Diagnosis not present

## 2022-12-14 DIAGNOSIS — Z1283 Encounter for screening for malignant neoplasm of skin: Secondary | ICD-10-CM

## 2022-12-14 DIAGNOSIS — L918 Other hypertrophic disorders of the skin: Secondary | ICD-10-CM

## 2022-12-14 DIAGNOSIS — D1801 Hemangioma of skin and subcutaneous tissue: Secondary | ICD-10-CM | POA: Diagnosis not present

## 2022-12-14 DIAGNOSIS — L814 Other melanin hyperpigmentation: Secondary | ICD-10-CM | POA: Diagnosis not present

## 2022-12-14 DIAGNOSIS — W908XXA Exposure to other nonionizing radiation, initial encounter: Secondary | ICD-10-CM | POA: Diagnosis not present

## 2022-12-14 DIAGNOSIS — D229 Melanocytic nevi, unspecified: Secondary | ICD-10-CM

## 2022-12-14 DIAGNOSIS — L578 Other skin changes due to chronic exposure to nonionizing radiation: Secondary | ICD-10-CM

## 2022-12-14 DIAGNOSIS — L820 Inflamed seborrheic keratosis: Secondary | ICD-10-CM

## 2022-12-14 DIAGNOSIS — L821 Other seborrheic keratosis: Secondary | ICD-10-CM | POA: Diagnosis not present

## 2022-12-14 NOTE — Progress Notes (Signed)
   New Patient Visit   Subjective  Peggy Santiago is a 72 y.o. female who presents for the following: Skin Cancer Screening and Full Body Skin Exam. No personal hx of skin cancer or dysplastic nevi. Area of concern on left cheek. Irritated, itching at times. Has been removed in the past, would like removed again  The patient presents for Total-Body Skin Exam (TBSE) for skin cancer screening and mole check. The patient has spots, moles and lesions to be evaluated, some may be new or changing and the patient has concerns that these could be cancer.    The following portions of the chart were reviewed this encounter and updated as appropriate: medications, allergies, medical history  Review of Systems:  No other skin or systemic complaints except as noted in HPI or Assessment and Plan.  Objective  Well appearing patient in no apparent distress; mood and affect are within normal limits.  A full examination was performed including scalp, head, eyes, ears, nose, lips, neck, chest, axillae, abdomen, back, buttocks, bilateral upper extremities, bilateral lower extremities, hands, feet, fingers, toes, fingernails, and toenails. All findings within normal limits unless otherwise noted below.   Relevant physical exam findings are noted in the Assessment and Plan.  Left Malar Cheek x1 Erythematous waxy papule    Assessment & Plan   LENTIGINES, SEBORRHEIC KERATOSES, HEMANGIOMAS - Benign normal skin lesions - Benign-appearing - Call for any changes  MELANOCYTIC NEVI - Tan-brown and/or pink-flesh-colored symmetric macules and papules - Benign appearing on exam today - Observation - Call clinic for new or changing moles - Recommend daily use of broad spectrum spf 30+ sunscreen to sun-exposed areas.  - Check nails when remove polish.  ACTINIC DAMAGE - Chronic condition, secondary to cumulative UV/sun exposure - diffuse scaly erythematous macules with underlying dyspigmentation -  Recommend daily broad spectrum sunscreen SPF 30+ to sun-exposed areas, reapply every 2 hours as needed.  - Staying in the shade or wearing long sleeves, sun glasses (UVA+UVB protection) and wide brim hats (4-inch brim around the entire circumference of the hat) are also recommended for sun protection.  - Call for new or changing lesions.  SKIN CANCER SCREENING PERFORMED TODAY.  Acrochordons (Skin Tags) - Fleshy, skin-colored pedunculated papules - Benign appearing.  - Observe. - If desired, they can be removed with an in office procedure that is not covered by insurance. - Please call the clinic if you notice any new or changing lesions.   Inflamed seborrheic keratosis Left Malar Cheek x1  Symptomatic, irritating, patient would like treated.  Destruction of lesion - Left Malar Cheek x1  Destruction method: cryotherapy   Informed consent: discussed and consent obtained   Lesion destroyed using liquid nitrogen: Yes   Region frozen until ice ball extended beyond lesion: Yes   Outcome: patient tolerated procedure well with no complications   Post-procedure details: wound care instructions given   Additional details:  Prior to procedure, discussed risks of blister formation, small wound, skin dyspigmentation, or rare scar following cryotherapy. Recommend Vaseline ointment to treated areas while healing.     Return in about 1 year (around 12/14/2023) for TBSE.  I, Lawson Radar, CMA, am acting as scribe for Langston Reusing, DO.   Documentation: I have reviewed the above documentation for accuracy and completeness, and I agree with the above.  Langston Reusing, DO

## 2022-12-14 NOTE — Patient Instructions (Addendum)
Cryotherapy Aftercare  Wash gently with soap and water everyday.   Apply Vaseline daily until healed.  Apply sunscreen while at the beach.    Skin Education :   I counseled the patient regarding the following: Sun screen (SPF 30 or greater) should be applied during peak UV exposure (between 10am and 2pm) and reapplied after exercise or swimming.  The ABCDEs of melanoma were reviewed with the patient, and the importance of monthly self-examination of moles was emphasized. Should any moles change in shape or color, or itch, bleed or burn, pt will contact our office for evaluation sooner then their interval appointment.  Plan: Sunscreen Recommendations I recommended a broad spectrum sunscreen with a SPF of 30 or higher. I explained that SPF 30 sunscreens block approximately 97 percent of the sun's harmful rays. Sunscreens should be applied at least 15 minutes prior to expected sun exposure and then every 2 hours after that as long as sun exposure continues. If swimming or exercising sunscreen should be reapplied every 45 minutes to an hour after getting wet or sweating. One ounce, or the equivalent of a shot glass full of sunscreen, is adequate to protect the skin not covered by a bathing suit. I also recommended a lip balm with a sunscreen as well. Sun protective clothing can be used in lieu of sunscreen but must be worn the entire time you are exposed to the sun's rays.    Due to recent changes in healthcare laws, you may see results of your pathology and/or laboratory studies on MyChart before the doctors have had a chance to review them. We understand that in some cases there may be results that are confusing or concerning to you. Please understand that not all results are received at the same time and often the doctors may need to interpret multiple results in order to provide you with the best plan of care or course of treatment. Therefore, we ask that you please give [place] 2 business days to  thoroughly review all your results before contacting the office for clarification. Should we see a critical lab result, you will be contacted sooner.   If You Need Anything After Your Visit  If you have any questions or concerns for your doctor, please call our main line at [phone] If no one answers, please leave a voicemail as directed and we will return your call as soon as possible. Messages left after 4 pm will be answered the following business day.   You may also send [place] a message via MyChart. We typically respond to MyChart messages within 1-2 business days.  For prescription refills, please ask your pharmacy to contact our office. Our fax number is [phone].  If you have an urgent issue when the clinic is closed that cannot wait until the next business day, you can page your doctor at the number below.    Please note that while we do our best to be available for urgent issues outside of office hours, we are not available 24/7.   If you have an urgent issue and are unable to reach [place], you may choose to seek medical care at your doctor's office, retail clinic, urgent care center, or emergency room.  If you have a medical emergency, please immediately call 911 or go to the emergency department. In the event of inclement weather, please call our main line at [phone] for an update on the status of any delays or closures.  Dermatology Medication Tips: Please keep the boxes that  topical medications come in in order to help keep track of the instructions about where and how to use these. Pharmacies typically print the medication instructions only on the boxes and not directly on the medication tubes.   If your medication is too expensive, please contact our office at [phone] or send [place] a message through MyChart.   We are unable to tell what your co-pay for medications will be in advance as this is different depending on your insurance coverage. However, we may be able to  find a substitute medication at lower cost or fill out paperwork to get insurance to cover a needed medication.   If a prior authorization is required to get your medication covered by your insurance company, please allow [place] 1-2 business days to complete this process.  Drug prices often vary depending on where the prescription is filled and some pharmacies may offer cheaper prices.  The website www.goodrx.com contains coupons for medications through different pharmacies. The prices here do not account for what the cost may be with help from insurance (it may be cheaper with your insurance), but the website can give you the price if you did not use any insurance.  - You can print the associated coupon and take it with your prescription to the pharmacy.  - You may also stop by our office during regular business hours and pick up a GoodRx coupon card.  - If you need your prescription sent electronically to a different pharmacy, notify our office through [facility] or by phone at [phone]

## 2022-12-16 ENCOUNTER — Encounter: Payer: Self-pay | Admitting: Dermatology

## 2022-12-29 DIAGNOSIS — J040 Acute laryngitis: Secondary | ICD-10-CM | POA: Diagnosis not present

## 2022-12-29 DIAGNOSIS — R070 Pain in throat: Secondary | ICD-10-CM | POA: Diagnosis not present

## 2023-02-07 DIAGNOSIS — M542 Cervicalgia: Secondary | ICD-10-CM | POA: Diagnosis not present

## 2023-02-07 DIAGNOSIS — M546 Pain in thoracic spine: Secondary | ICD-10-CM | POA: Diagnosis not present

## 2023-02-07 DIAGNOSIS — M545 Low back pain, unspecified: Secondary | ICD-10-CM | POA: Diagnosis not present

## 2023-02-07 DIAGNOSIS — M9901 Segmental and somatic dysfunction of cervical region: Secondary | ICD-10-CM | POA: Diagnosis not present

## 2023-02-07 DIAGNOSIS — M9902 Segmental and somatic dysfunction of thoracic region: Secondary | ICD-10-CM | POA: Diagnosis not present

## 2023-02-07 DIAGNOSIS — M9903 Segmental and somatic dysfunction of lumbar region: Secondary | ICD-10-CM | POA: Diagnosis not present

## 2023-02-08 DIAGNOSIS — M9901 Segmental and somatic dysfunction of cervical region: Secondary | ICD-10-CM | POA: Diagnosis not present

## 2023-02-08 DIAGNOSIS — M9903 Segmental and somatic dysfunction of lumbar region: Secondary | ICD-10-CM | POA: Diagnosis not present

## 2023-02-08 DIAGNOSIS — M9902 Segmental and somatic dysfunction of thoracic region: Secondary | ICD-10-CM | POA: Diagnosis not present

## 2023-02-08 DIAGNOSIS — M545 Low back pain, unspecified: Secondary | ICD-10-CM | POA: Diagnosis not present

## 2023-02-08 DIAGNOSIS — M542 Cervicalgia: Secondary | ICD-10-CM | POA: Diagnosis not present

## 2023-02-08 DIAGNOSIS — M546 Pain in thoracic spine: Secondary | ICD-10-CM | POA: Diagnosis not present

## 2023-02-09 ENCOUNTER — Other Ambulatory Visit: Payer: Self-pay | Admitting: Family Medicine

## 2023-02-09 DIAGNOSIS — K219 Gastro-esophageal reflux disease without esophagitis: Secondary | ICD-10-CM

## 2023-02-14 DIAGNOSIS — M546 Pain in thoracic spine: Secondary | ICD-10-CM | POA: Diagnosis not present

## 2023-02-14 DIAGNOSIS — M545 Low back pain, unspecified: Secondary | ICD-10-CM | POA: Diagnosis not present

## 2023-02-14 DIAGNOSIS — M9903 Segmental and somatic dysfunction of lumbar region: Secondary | ICD-10-CM | POA: Diagnosis not present

## 2023-02-14 DIAGNOSIS — M9902 Segmental and somatic dysfunction of thoracic region: Secondary | ICD-10-CM | POA: Diagnosis not present

## 2023-02-14 DIAGNOSIS — M542 Cervicalgia: Secondary | ICD-10-CM | POA: Diagnosis not present

## 2023-02-14 DIAGNOSIS — M9901 Segmental and somatic dysfunction of cervical region: Secondary | ICD-10-CM | POA: Diagnosis not present

## 2023-02-16 DIAGNOSIS — M9901 Segmental and somatic dysfunction of cervical region: Secondary | ICD-10-CM | POA: Diagnosis not present

## 2023-02-16 DIAGNOSIS — M545 Low back pain, unspecified: Secondary | ICD-10-CM | POA: Diagnosis not present

## 2023-02-16 DIAGNOSIS — M546 Pain in thoracic spine: Secondary | ICD-10-CM | POA: Diagnosis not present

## 2023-02-16 DIAGNOSIS — M9902 Segmental and somatic dysfunction of thoracic region: Secondary | ICD-10-CM | POA: Diagnosis not present

## 2023-02-16 DIAGNOSIS — M9903 Segmental and somatic dysfunction of lumbar region: Secondary | ICD-10-CM | POA: Diagnosis not present

## 2023-02-16 DIAGNOSIS — M542 Cervicalgia: Secondary | ICD-10-CM | POA: Diagnosis not present

## 2023-02-17 ENCOUNTER — Ambulatory Visit (INDEPENDENT_AMBULATORY_CARE_PROVIDER_SITE_OTHER): Payer: Medicare Other | Admitting: Family Medicine

## 2023-02-17 VITALS — BP 122/62 | HR 62 | Temp 98.1°F | Resp 18 | Ht 62.0 in | Wt 185.0 lb

## 2023-02-17 DIAGNOSIS — D510 Vitamin B12 deficiency anemia due to intrinsic factor deficiency: Secondary | ICD-10-CM | POA: Diagnosis not present

## 2023-02-17 DIAGNOSIS — M9902 Segmental and somatic dysfunction of thoracic region: Secondary | ICD-10-CM | POA: Diagnosis not present

## 2023-02-17 DIAGNOSIS — M9901 Segmental and somatic dysfunction of cervical region: Secondary | ICD-10-CM | POA: Diagnosis not present

## 2023-02-17 DIAGNOSIS — R7303 Prediabetes: Secondary | ICD-10-CM | POA: Diagnosis not present

## 2023-02-17 DIAGNOSIS — K219 Gastro-esophageal reflux disease without esophagitis: Secondary | ICD-10-CM

## 2023-02-17 DIAGNOSIS — M546 Pain in thoracic spine: Secondary | ICD-10-CM | POA: Diagnosis not present

## 2023-02-17 DIAGNOSIS — E785 Hyperlipidemia, unspecified: Secondary | ICD-10-CM | POA: Diagnosis not present

## 2023-02-17 DIAGNOSIS — M9903 Segmental and somatic dysfunction of lumbar region: Secondary | ICD-10-CM | POA: Diagnosis not present

## 2023-02-17 DIAGNOSIS — M545 Low back pain, unspecified: Secondary | ICD-10-CM | POA: Diagnosis not present

## 2023-02-17 DIAGNOSIS — M542 Cervicalgia: Secondary | ICD-10-CM | POA: Diagnosis not present

## 2023-02-17 MED ORDER — OMEPRAZOLE 40 MG PO CPDR: 40.0000 mg | DELAYED_RELEASE_CAPSULE | Freq: Every day | ORAL | 3 refills | Status: DC

## 2023-02-17 NOTE — Progress Notes (Addendum)
[facility] [address], [address] [place], [place] [address] [phone]  Date:  02/17/2023   Name:  Peggy Santiago   DOB:  04/16/1951   MRN:  [id number]  PCP:  Pearline Cables, MD    Chief Complaint: Follow-up (Concerns/ questions: Thomasene Mohair due/Foot exam, are urine DM are due)   History of Present Illness:  Peggy Santiago is a 72 y.o. very pleasant female patient who presents with the following:  Pt seen today for a recheck Last seen by myself in February  History of osteopenia, dyslipidemia, prediabetes, migraine headache, cervical spine disease, B12 deficiency   She has crestor but is not taking it- she has tried quite a few statin drugs and had side effects with all.  However, she would be willing to try taking Crestor perhaps every other day and see how she does.  We will check on her lipids and get back to her We also previously tried a PCSK9, but it was too expensive  She has pernicious anemia and gets monthly B12 shots  Lab Results  Component Value Date   HGBA1C 6.6 (H) 09/22/2022     Patient Active Problem List   Diagnosis Date Noted   Atypical chest pain 06/26/2021   Flank pain 06/26/2021   Osteopenia 07/02/2017   Dyslipidemia 06/15/2016   Pernicious anemia 06/10/2015   Allergic rhinitis 06/10/2015   IBS (irritable bowel syndrome) 06/10/2015   Nephrolithiasis 06/10/2015   Migraine 06/10/2015   Cervical spondylosis without myelopathy 06/10/2015   DDD (degenerative disc disease), cervical 06/10/2015   S/P total knee arthroplasty 03/25/2014   Arthritis of knee, degenerative 02/26/2014   Controlled diabetes mellitus type 2 with complications (HCC) 02/23/2014   Carpal tunnel syndrome 02/02/2013    Past Medical History:  Diagnosis Date   Allergy    Anemia    Chronic kidney disease    kidney stones   Diabetes mellitus without complication (HCC)    borderline   Osteoarthritis of knee    PONV  (postoperative nausea and vomiting)     Past Surgical History:  Procedure Laterality Date   ABDOMINAL HYSTERECTOMY     Partial; one ovary remains   ANKLE SURGERY     with plate   APPENDECTOMY     CHOLECYSTECTOMY     colon resection due to SBO     EYE SURGERY     JOINT REPLACEMENT     2 scopes rt knee   TOTAL KNEE ARTHROPLASTY Right 03/25/2014   Procedure: TOTAL KNEE ARTHROPLASTY;  Surgeon: Dannielle Huh, MD;  Location: [facility] OR;  Service: Orthopedics;  Laterality: Right;    Social History   Tobacco Use   Smoking status: Never   Smokeless tobacco: Never  Vaping Use   Vaping status: Never Used  Substance Use Topics   Alcohol use: No    Alcohol/week: 0.0 standard drinks of alcohol   Drug use: No    Family History  Problem Relation Age of Onset   Heart disease Mother    Rheum arthritis Mother    Alzheimer's disease Mother    Heart disease Father    Diabetes Sister    Heart disease Sister    Hyperlipidemia Sister    Cancer Brother    Heart disease Brother    Hyperlipidemia Brother    Hyperlipidemia Sister    Heart disease Sister    Diabetes Sister     Allergies  Allergen Reactions  Statins     Body aches, has tried several times and does not tolerate well   Contrast Media [Iodinated Contrast Media] Hives, Rash and Other (See Comments)    ALL OVER THE BODY   Ioxaglate Rash and Hives    ALL OVER THE BODY    Medication list has been reviewed and updated.  Current Outpatient Medications on File Prior to Visit  Medication Sig Dispense Refill   celecoxib (CELEBREX) 200 MG capsule Take 1 capsule by mouth twice daily 60 capsule 0   cyanocobalamin (VITAMIN B12) 1000 MCG/ML injection Inject 1 mL (1,000 mcg total) into the muscle every 30 (thirty) days. 3 mL 1   cyclobenzaprine (FLEXERIL) 10 MG tablet Take 1 tablet (10 mg total) by mouth 2 (two) times daily as needed for muscle spasms. 15 tablet 0   Multiple Vitamin (MULTIVITAMIN WITH MINERALS) TABS tablet Take 1 tablet  by mouth daily.     rosuvastatin (CRESTOR) 5 MG tablet Take 1 tablet (5 mg total) by mouth daily. (Patient not taking: Reported on 02/17/2023) 30 tablet 3   No current facility-administered medications on file prior to visit.    Review of Systems:  As per HPI- otherwise negative.   Physical Examination: Vitals:   02/17/23 1539  BP: 122/62  Pulse: 62  Resp: 18  Temp: 98.1 F (36.7 C)  SpO2: 98%   Vitals:   02/17/23 1539  Weight: 185 lb (83.9 kg)  Height: 5\' 2"  (1.575 m)   Body mass index is 33.84 kg/m. Ideal Body Weight: Weight in (lb) to have BMI = 25: 136.4  GEN: no acute distress.  Obese, looks well HEENT: Atraumatic, Normocephalic.  Ears and Nose: No external deformity. CV: RRR, No M/G/R. No JVD. No thrill. No extra heart sounds. PULM: CTA B, no wheezes, crackles, rhonchi. No retractions. No resp. distress. No accessory muscle use. ABD: S, NT, ND EXTR: No c/c/e PSYCH: Normally interactive. Conversant.  Normal foot exam  Assessment and Plan: Prediabetes - Plan: Hemoglobin A1c  Dyslipidemia - Plan: Lipid panel  Gastroesophageal reflux disease, unspecified whether esophagitis present - Plan: omeprazole (PRILOSEC) 40 MG capsule  Pernicious anemia - Plan: Vitamin B12  Patient seen today for follow-up.  Follow-up on her prediabetes with A1c level Lipid panel pending, she has had difficulty tolerating statins.  However, she is willing to try this again if needed.  I will get in touch with her lipid results  She continues to take omeprazole daily, has recurrent symptoms if she tries to stop this medication.  Refill today  She is compliant with her B12 shots, check level today  Will plan further follow- up pending labs.   Signed Abbe Amsterdam, MD  Addnd 8/9- received labs as below, message to pt  Results for orders placed or performed in visit on 02/17/23  Hemoglobin A1c  Result Value Ref Range   Hgb A1c MFr Bld 6.5 (H) <5.7 % of total Hgb   Mean Plasma  Glucose 140 mg/dL   eAG (mmol/L) 7.7 mmol/L  Lipid panel  Result Value Ref Range   Cholesterol 254 (H) <200 mg/dL   HDL 37 (L) > OR = 50 mg/dL   Triglycerides 409 (H) <150 mg/dL   LDL Cholesterol (Calc) 169 (H) mg/dL (calc)   Total CHOL/HDL Ratio 6.9 (H) <5.0 (calc)   Non-HDL Cholesterol (Calc) 217 (H) <130 mg/dL (calc)  Vitamin W11  Result Value Ref Range   Vitamin B-12 424 200 - 1,100 pg/mL

## 2023-02-17 NOTE — Patient Instructions (Signed)
Good to see you today- I will be in touch with your labs asap

## 2023-02-18 ENCOUNTER — Encounter: Payer: Self-pay | Admitting: Family Medicine

## 2023-02-21 DIAGNOSIS — M9903 Segmental and somatic dysfunction of lumbar region: Secondary | ICD-10-CM | POA: Diagnosis not present

## 2023-02-21 DIAGNOSIS — M9901 Segmental and somatic dysfunction of cervical region: Secondary | ICD-10-CM | POA: Diagnosis not present

## 2023-02-21 DIAGNOSIS — M9902 Segmental and somatic dysfunction of thoracic region: Secondary | ICD-10-CM | POA: Diagnosis not present

## 2023-02-21 DIAGNOSIS — M546 Pain in thoracic spine: Secondary | ICD-10-CM | POA: Diagnosis not present

## 2023-02-21 DIAGNOSIS — M542 Cervicalgia: Secondary | ICD-10-CM | POA: Diagnosis not present

## 2023-02-21 DIAGNOSIS — M545 Low back pain, unspecified: Secondary | ICD-10-CM | POA: Diagnosis not present

## 2023-02-23 DIAGNOSIS — M9903 Segmental and somatic dysfunction of lumbar region: Secondary | ICD-10-CM | POA: Diagnosis not present

## 2023-02-23 DIAGNOSIS — M9902 Segmental and somatic dysfunction of thoracic region: Secondary | ICD-10-CM | POA: Diagnosis not present

## 2023-02-23 DIAGNOSIS — M542 Cervicalgia: Secondary | ICD-10-CM | POA: Diagnosis not present

## 2023-02-23 DIAGNOSIS — M9901 Segmental and somatic dysfunction of cervical region: Secondary | ICD-10-CM | POA: Diagnosis not present

## 2023-02-23 DIAGNOSIS — M546 Pain in thoracic spine: Secondary | ICD-10-CM | POA: Diagnosis not present

## 2023-02-23 DIAGNOSIS — M545 Low back pain, unspecified: Secondary | ICD-10-CM | POA: Diagnosis not present

## 2023-02-24 DIAGNOSIS — M546 Pain in thoracic spine: Secondary | ICD-10-CM | POA: Diagnosis not present

## 2023-02-24 DIAGNOSIS — M542 Cervicalgia: Secondary | ICD-10-CM | POA: Diagnosis not present

## 2023-02-24 DIAGNOSIS — M9903 Segmental and somatic dysfunction of lumbar region: Secondary | ICD-10-CM | POA: Diagnosis not present

## 2023-02-24 DIAGNOSIS — M9902 Segmental and somatic dysfunction of thoracic region: Secondary | ICD-10-CM | POA: Diagnosis not present

## 2023-02-24 DIAGNOSIS — M9901 Segmental and somatic dysfunction of cervical region: Secondary | ICD-10-CM | POA: Diagnosis not present

## 2023-02-24 DIAGNOSIS — M545 Low back pain, unspecified: Secondary | ICD-10-CM | POA: Diagnosis not present

## 2023-02-28 DIAGNOSIS — M542 Cervicalgia: Secondary | ICD-10-CM | POA: Diagnosis not present

## 2023-02-28 DIAGNOSIS — M545 Low back pain, unspecified: Secondary | ICD-10-CM | POA: Diagnosis not present

## 2023-02-28 DIAGNOSIS — M9902 Segmental and somatic dysfunction of thoracic region: Secondary | ICD-10-CM | POA: Diagnosis not present

## 2023-02-28 DIAGNOSIS — M9903 Segmental and somatic dysfunction of lumbar region: Secondary | ICD-10-CM | POA: Diagnosis not present

## 2023-02-28 DIAGNOSIS — M9901 Segmental and somatic dysfunction of cervical region: Secondary | ICD-10-CM | POA: Diagnosis not present

## 2023-02-28 DIAGNOSIS — M546 Pain in thoracic spine: Secondary | ICD-10-CM | POA: Diagnosis not present

## 2023-03-01 ENCOUNTER — Other Ambulatory Visit: Payer: Self-pay | Admitting: Family Medicine

## 2023-03-01 DIAGNOSIS — D510 Vitamin B12 deficiency anemia due to intrinsic factor deficiency: Secondary | ICD-10-CM

## 2023-03-02 DIAGNOSIS — M9902 Segmental and somatic dysfunction of thoracic region: Secondary | ICD-10-CM | POA: Diagnosis not present

## 2023-03-02 DIAGNOSIS — M542 Cervicalgia: Secondary | ICD-10-CM | POA: Diagnosis not present

## 2023-03-02 DIAGNOSIS — M9901 Segmental and somatic dysfunction of cervical region: Secondary | ICD-10-CM | POA: Diagnosis not present

## 2023-03-02 DIAGNOSIS — M545 Low back pain, unspecified: Secondary | ICD-10-CM | POA: Diagnosis not present

## 2023-03-02 DIAGNOSIS — M546 Pain in thoracic spine: Secondary | ICD-10-CM | POA: Diagnosis not present

## 2023-03-02 DIAGNOSIS — M9903 Segmental and somatic dysfunction of lumbar region: Secondary | ICD-10-CM | POA: Diagnosis not present

## 2023-03-03 DIAGNOSIS — M9903 Segmental and somatic dysfunction of lumbar region: Secondary | ICD-10-CM | POA: Diagnosis not present

## 2023-03-03 DIAGNOSIS — M546 Pain in thoracic spine: Secondary | ICD-10-CM | POA: Diagnosis not present

## 2023-03-03 DIAGNOSIS — M545 Low back pain, unspecified: Secondary | ICD-10-CM | POA: Diagnosis not present

## 2023-03-03 DIAGNOSIS — M9901 Segmental and somatic dysfunction of cervical region: Secondary | ICD-10-CM | POA: Diagnosis not present

## 2023-03-03 DIAGNOSIS — M9902 Segmental and somatic dysfunction of thoracic region: Secondary | ICD-10-CM | POA: Diagnosis not present

## 2023-03-03 DIAGNOSIS — M542 Cervicalgia: Secondary | ICD-10-CM | POA: Diagnosis not present

## 2023-03-07 ENCOUNTER — Other Ambulatory Visit: Payer: Self-pay | Admitting: Family Medicine

## 2023-03-07 ENCOUNTER — Encounter: Payer: Self-pay | Admitting: Family Medicine

## 2023-03-07 DIAGNOSIS — M546 Pain in thoracic spine: Secondary | ICD-10-CM | POA: Diagnosis not present

## 2023-03-07 DIAGNOSIS — M9901 Segmental and somatic dysfunction of cervical region: Secondary | ICD-10-CM | POA: Diagnosis not present

## 2023-03-07 DIAGNOSIS — M9903 Segmental and somatic dysfunction of lumbar region: Secondary | ICD-10-CM | POA: Diagnosis not present

## 2023-03-07 DIAGNOSIS — M9902 Segmental and somatic dysfunction of thoracic region: Secondary | ICD-10-CM | POA: Diagnosis not present

## 2023-03-07 DIAGNOSIS — M545 Low back pain, unspecified: Secondary | ICD-10-CM | POA: Diagnosis not present

## 2023-03-07 DIAGNOSIS — M542 Cervicalgia: Secondary | ICD-10-CM | POA: Diagnosis not present

## 2023-03-07 MED ORDER — ROSUVASTATIN CALCIUM 5 MG PO TABS: 5.0000 mg | ORAL_TABLET | Freq: Every day | ORAL | 3 refills | Status: DC

## 2023-03-09 DIAGNOSIS — M9901 Segmental and somatic dysfunction of cervical region: Secondary | ICD-10-CM | POA: Diagnosis not present

## 2023-03-09 DIAGNOSIS — M9902 Segmental and somatic dysfunction of thoracic region: Secondary | ICD-10-CM | POA: Diagnosis not present

## 2023-03-09 DIAGNOSIS — M545 Low back pain, unspecified: Secondary | ICD-10-CM | POA: Diagnosis not present

## 2023-03-09 DIAGNOSIS — M9903 Segmental and somatic dysfunction of lumbar region: Secondary | ICD-10-CM | POA: Diagnosis not present

## 2023-03-09 DIAGNOSIS — M542 Cervicalgia: Secondary | ICD-10-CM | POA: Diagnosis not present

## 2023-03-09 DIAGNOSIS — M546 Pain in thoracic spine: Secondary | ICD-10-CM | POA: Diagnosis not present

## 2023-03-10 DIAGNOSIS — M546 Pain in thoracic spine: Secondary | ICD-10-CM | POA: Diagnosis not present

## 2023-03-10 DIAGNOSIS — M9902 Segmental and somatic dysfunction of thoracic region: Secondary | ICD-10-CM | POA: Diagnosis not present

## 2023-03-10 DIAGNOSIS — M542 Cervicalgia: Secondary | ICD-10-CM | POA: Diagnosis not present

## 2023-03-10 DIAGNOSIS — M9903 Segmental and somatic dysfunction of lumbar region: Secondary | ICD-10-CM | POA: Diagnosis not present

## 2023-03-10 DIAGNOSIS — M545 Low back pain, unspecified: Secondary | ICD-10-CM | POA: Diagnosis not present

## 2023-03-10 DIAGNOSIS — M9901 Segmental and somatic dysfunction of cervical region: Secondary | ICD-10-CM | POA: Diagnosis not present

## 2023-03-15 DIAGNOSIS — M546 Pain in thoracic spine: Secondary | ICD-10-CM | POA: Diagnosis not present

## 2023-03-15 DIAGNOSIS — M9901 Segmental and somatic dysfunction of cervical region: Secondary | ICD-10-CM | POA: Diagnosis not present

## 2023-03-15 DIAGNOSIS — M9903 Segmental and somatic dysfunction of lumbar region: Secondary | ICD-10-CM | POA: Diagnosis not present

## 2023-03-15 DIAGNOSIS — M9902 Segmental and somatic dysfunction of thoracic region: Secondary | ICD-10-CM | POA: Diagnosis not present

## 2023-03-15 DIAGNOSIS — M545 Low back pain, unspecified: Secondary | ICD-10-CM | POA: Diagnosis not present

## 2023-03-15 DIAGNOSIS — M542 Cervicalgia: Secondary | ICD-10-CM | POA: Diagnosis not present

## 2023-03-30 DIAGNOSIS — M9901 Segmental and somatic dysfunction of cervical region: Secondary | ICD-10-CM | POA: Diagnosis not present

## 2023-03-30 DIAGNOSIS — M542 Cervicalgia: Secondary | ICD-10-CM | POA: Diagnosis not present

## 2023-03-30 DIAGNOSIS — M546 Pain in thoracic spine: Secondary | ICD-10-CM | POA: Diagnosis not present

## 2023-03-30 DIAGNOSIS — M545 Low back pain, unspecified: Secondary | ICD-10-CM | POA: Diagnosis not present

## 2023-03-30 DIAGNOSIS — M9902 Segmental and somatic dysfunction of thoracic region: Secondary | ICD-10-CM | POA: Diagnosis not present

## 2023-03-30 DIAGNOSIS — M9903 Segmental and somatic dysfunction of lumbar region: Secondary | ICD-10-CM | POA: Diagnosis not present

## 2023-03-31 DIAGNOSIS — M9903 Segmental and somatic dysfunction of lumbar region: Secondary | ICD-10-CM | POA: Diagnosis not present

## 2023-03-31 DIAGNOSIS — M9902 Segmental and somatic dysfunction of thoracic region: Secondary | ICD-10-CM | POA: Diagnosis not present

## 2023-03-31 DIAGNOSIS — M545 Low back pain, unspecified: Secondary | ICD-10-CM | POA: Diagnosis not present

## 2023-03-31 DIAGNOSIS — M9901 Segmental and somatic dysfunction of cervical region: Secondary | ICD-10-CM | POA: Diagnosis not present

## 2023-03-31 DIAGNOSIS — M546 Pain in thoracic spine: Secondary | ICD-10-CM | POA: Diagnosis not present

## 2023-03-31 DIAGNOSIS — M542 Cervicalgia: Secondary | ICD-10-CM | POA: Diagnosis not present

## 2023-04-04 DIAGNOSIS — M9903 Segmental and somatic dysfunction of lumbar region: Secondary | ICD-10-CM | POA: Diagnosis not present

## 2023-04-04 DIAGNOSIS — M545 Low back pain, unspecified: Secondary | ICD-10-CM | POA: Diagnosis not present

## 2023-04-04 DIAGNOSIS — M542 Cervicalgia: Secondary | ICD-10-CM | POA: Diagnosis not present

## 2023-04-04 DIAGNOSIS — M9901 Segmental and somatic dysfunction of cervical region: Secondary | ICD-10-CM | POA: Diagnosis not present

## 2023-04-04 DIAGNOSIS — M546 Pain in thoracic spine: Secondary | ICD-10-CM | POA: Diagnosis not present

## 2023-04-04 DIAGNOSIS — M9902 Segmental and somatic dysfunction of thoracic region: Secondary | ICD-10-CM | POA: Diagnosis not present

## 2023-05-24 ENCOUNTER — Other Ambulatory Visit: Payer: Self-pay

## 2023-05-24 MED ORDER — CELECOXIB 200 MG PO CAPS: 200.0000 mg | ORAL_CAPSULE | Freq: Two times a day (BID) | ORAL | 0 refills | Status: DC

## 2023-07-26 ENCOUNTER — Other Ambulatory Visit (HOSPITAL_BASED_OUTPATIENT_CLINIC_OR_DEPARTMENT_OTHER): Payer: Self-pay | Admitting: Family Medicine

## 2023-07-26 DIAGNOSIS — Z1231 Encounter for screening mammogram for malignant neoplasm of breast: Secondary | ICD-10-CM

## 2023-08-18 ENCOUNTER — Telehealth: Payer: Self-pay

## 2023-08-18 ENCOUNTER — Encounter: Payer: Self-pay | Admitting: Family Medicine

## 2023-08-18 DIAGNOSIS — R112 Nausea with vomiting, unspecified: Secondary | ICD-10-CM

## 2023-08-18 NOTE — Telephone Encounter (Signed)
 Called and got her husband Leighton Punches- I wanted to talk to the patient to get more info. I called her cell, no answer.  LM asking her to please send me a mychart

## 2023-08-18 NOTE — Telephone Encounter (Signed)
 Copied from CRM [id number]. Topic: Clinical - Medical Advice >> Aug 18, 2023 11:01 AM Evie B wrote: Reason for CRM: pt called stating she has nausea, vomiting would like he provider to call her in a medication to help with nausea. Please cal pt back at [phone]

## 2023-08-19 MED ORDER — ONDANSETRON HCL 8 MG PO TABS: 8.0000 mg | ORAL_TABLET | Freq: Three times a day (TID) | ORAL | 0 refills | Status: DC | PRN

## 2023-08-28 NOTE — Patient Instructions (Incomplete)
Good to see you again today!   Recommend that you get the shingles vaccine series, a tetanus and covid booster if not up to date

## 2023-08-28 NOTE — Progress Notes (Deleted)
 [facility] [address] [place], [place] [address] [phone]  Date:  08/31/2023   Name:  ZASHA BELLEAU   DOB:  08-Apr-1951   MRN:  [id number]  PCP:  Pearline Cables, MD    Chief Complaint: No chief complaint on file.   History of Present Illness:  Peggy Santiago is a 73 y.o. very pleasant female patient who presents with the following:  Seen today for medicare recheck visit Last seen by myself in August  History of osteopenia, dyslipidemia, prediabetes, migraine headache, cervical spine disease, B12 deficiency/ pernicious anemia on B12 injections   We had her try crestor on a modified dosing schedule in August-  Flu vaccine Update A1c Shingrix Recommend td Mammo is scheduled for next month Colon due this year   Celebrex B12 Prilosec crestor  Lab Results  Component Value Date   HGBA1C 6.5 (H) 02/17/2023      Patient Active Problem List   Diagnosis Date Noted   Atypical chest pain 06/26/2021   Flank pain 06/26/2021   Osteopenia 07/02/2017   Dyslipidemia 06/15/2016   Pernicious anemia 06/10/2015   Allergic rhinitis 06/10/2015   IBS (irritable bowel syndrome) 06/10/2015   Nephrolithiasis 06/10/2015   Migraine 06/10/2015   Cervical spondylosis without myelopathy 06/10/2015   DDD (degenerative disc disease), cervical 06/10/2015   S/P total knee arthroplasty 03/25/2014   Arthritis of knee, degenerative 02/26/2014   Controlled diabetes mellitus type 2 with complications (HCC) 02/23/2014   Carpal tunnel syndrome 02/02/2013    Past Medical History:  Diagnosis Date   Allergy    Anemia    Chronic kidney disease    kidney stones   Diabetes mellitus without complication (HCC)    borderline   Osteoarthritis of knee    PONV (postoperative nausea and vomiting)     Past Surgical History:  Procedure Laterality Date   ABDOMINAL HYSTERECTOMY     Partial; one ovary remains   ANKLE SURGERY      with plate   APPENDECTOMY     CHOLECYSTECTOMY     colon resection due to SBO     EYE SURGERY     JOINT REPLACEMENT     2 scopes rt knee   TOTAL KNEE ARTHROPLASTY Right 03/25/2014   Procedure: TOTAL KNEE ARTHROPLASTY;  Surgeon: Dannielle Huh, MD;  Location: [facility] OR;  Service: Orthopedics;  Laterality: Right;    Social History   Tobacco Use   Smoking status: Never   Smokeless tobacco: Never  Vaping Use   Vaping status: Never Used  Substance Use Topics   Alcohol use: No    Alcohol/week: 0.0 standard drinks of alcohol   Drug use: No    Family History  Problem Relation Age of Onset   Heart disease Mother    Rheum arthritis Mother    Alzheimer's disease Mother    Heart disease Father    Diabetes Sister    Heart disease Sister    Hyperlipidemia Sister    Cancer Brother    Heart disease Brother    Hyperlipidemia Brother    Hyperlipidemia Sister    Heart disease Sister    Diabetes Sister     Allergies  Allergen Reactions   Statins     Body aches, has tried several times and does not tolerate well   Contrast Media [Iodinated Contrast Media] Hives, Rash and Other (See Comments)    ALL OVER THE BODY  Ioxaglate Rash and Hives    ALL OVER THE BODY    Medication list has been reviewed and updated.  Current Outpatient Medications on File Prior to Visit  Medication Sig Dispense Refill   celecoxib (CELEBREX) 200 MG capsule Take 1 capsule (200 mg total) by mouth 2 (two) times daily. 180 capsule 0   cyanocobalamin (VITAMIN B12) 1000 MCG/ML injection Inject 1 mL (1,000 mcg total) into the muscle every 30 (thirty) days. 10 mL 0   cyclobenzaprine (FLEXERIL) 10 MG tablet Take 1 tablet (10 mg total) by mouth 2 (two) times daily as needed for muscle spasms. 15 tablet 0   Multiple Vitamin (MULTIVITAMIN WITH MINERALS) TABS tablet Take 1 tablet by mouth daily.     omeprazole (PRILOSEC) 40 MG capsule Take 1 capsule (40 mg total) by mouth daily. 90 capsule 3   ondansetron (ZOFRAN) 8 MG  tablet Take 1 tablet (8 mg total) by mouth every 8 (eight) hours as needed for nausea or vomiting. 30 tablet 0   rosuvastatin (CRESTOR) 5 MG tablet Take 1 tablet (5 mg total) by mouth daily. 90 tablet 3   No current facility-administered medications on file prior to visit.    Review of Systems: As per HPI- otherwise negative.   Physical Examination: There were no vitals filed for this visit. There were no vitals filed for this visit. There is no height or weight on file to calculate BMI. Ideal Body Weight:    GEN: no acute distress. HEENT: Atraumatic, Normocephalic.  Ears and Nose: No external deformity. CV: RRR, No M/G/R. No JVD. No thrill. No extra heart sounds. PULM: CTA B, no wheezes, crackles, rhonchi. No retractions. No resp. distress. No accessory muscle use. ABD: S, NT, ND, +BS. No rebound. No HSM. EXTR: No c/c/e PSYCH: Normally interactive. Conversant.    Assessment and Plan: ***  Signed Abbe Amsterdam, MD

## 2023-08-31 ENCOUNTER — Ambulatory Visit: Payer: Medicare Other | Admitting: Family Medicine

## 2023-08-31 DIAGNOSIS — Z1329 Encounter for screening for other suspected endocrine disorder: Secondary | ICD-10-CM

## 2023-08-31 DIAGNOSIS — R7303 Prediabetes: Secondary | ICD-10-CM

## 2023-08-31 DIAGNOSIS — E785 Hyperlipidemia, unspecified: Secondary | ICD-10-CM

## 2023-08-31 DIAGNOSIS — D510 Vitamin B12 deficiency anemia due to intrinsic factor deficiency: Secondary | ICD-10-CM

## 2023-09-06 NOTE — Patient Instructions (Addendum)
 It was great to see you today, I will be in touch with your labs and hip x-ray If you like, try using tramadol for more severe hip pain.  Let me know how this works for you If your cholesterol is still too high- and since crestor is causing some SE- we can again look at a PCSK-9 injectable cholesterol med.  Tammy may be able to help find a cost solution for [place] Recommend the shingles vaccine series-Shingrix if not done already Flu shot today!  Also recommended dose of RSV, COVID booster if none the last 6 months or so, tetanus booster

## 2023-09-06 NOTE — Progress Notes (Signed)
 [facility] [address] [place], [place] [address] [phone]  Date:  09/12/2023   Name:  Peggy Santiago   DOB:  01-22-1951   MRN:  [id number]  PCP:  Pearline Cables, MD    Chief Complaint: Annual Exam (Concerns/ questions: Hip pain, do you think Estragen might be causing this?/Td due, Shingrix due- Medicare pt/AWV due/Urine DM due, A1C due/)   History of Present Illness:  Peggy Santiago is a 73 y.o. very pleasant female patient who presents with the following:  Seen today for medicare recheck visit/well visit Last seen by myself in August  She got norovirus recently- she is now over it but it took several days.  She had vomiting and diarrhea History of osteopenia, dyslipidemia, mild diabetes, migraine headache, cervical spine disease, B12 deficiency/ pernicious anemia on B12 injections   We had her try crestor on a modified dosing schedule in August-previous difficulty tolerating statins. She is now taking Crestor 3x a week.  She does okay with this but does feel that it increases her body pains We looked at a PCSK9 previously but it was too expensive  Flu vaccine- give flu shot today  Update A1c Shingrix- she had zostavax and one of shingrix Recommed tetanus booster- recommended  Mammo is scheduled for next month Colon due this year - she will reach out and schedule this with her gastroenterologist  Celebrex B12 Prilosec Crestor  She notes her left hip continues to hurt She tried some "soft wave"treatment  We most recently got plain films about 3 years ago which were normal She saw Dr Rayburn Ma and did an MRI in December 2022;  IMPRESSION: 1. No acute osseous abnormality or significant degenerative changes of the left hip. 2. Tendinosis of the proximal left hamstring tendons. 3. Colonic diverticulosis.  She notes worsening of her sx with prolonged standing, cannot lie on that side Walking also  may hurt The right hip is generally ok Patient notes her exam often seems to suggest greater trochanteric bursitis, but she has failed to improve with several injections for same She is using Voltaren gel, Tylenol, and Celebrex.  However she notes her pain still keeps her from doing things she would like to do such as walking and traveling with her husband  Lab Results  Component Value Date   HGBA1C 6.5 (H) 02/17/2023      Patient Active Problem List   Diagnosis Date Noted   Atypical chest pain 06/26/2021   Flank pain 06/26/2021   Osteopenia 07/02/2017   Dyslipidemia 06/15/2016   Pernicious anemia 06/10/2015   Allergic rhinitis 06/10/2015   IBS (irritable bowel syndrome) 06/10/2015   Nephrolithiasis 06/10/2015   Migraine 06/10/2015   Cervical spondylosis without myelopathy 06/10/2015   DDD (degenerative disc disease), cervical 06/10/2015   S/P total knee arthroplasty 03/25/2014   Arthritis of knee, degenerative 02/26/2014   Controlled diabetes mellitus type 2 with complications (HCC) 02/23/2014   Carpal tunnel syndrome 02/02/2013    Past Medical History:  Diagnosis Date   Allergy    Anemia    Chronic kidney disease    kidney stones   Diabetes mellitus without complication (HCC)    borderline   Osteoarthritis of knee    PONV (postoperative nausea and vomiting)     Past Surgical History:  Procedure Laterality Date   ABDOMINAL HYSTERECTOMY     Partial; one ovary remains   ANKLE SURGERY  with plate   APPENDECTOMY     CHOLECYSTECTOMY     colon resection due to SBO     EYE SURGERY     JOINT REPLACEMENT     2 scopes rt knee   TOTAL KNEE ARTHROPLASTY Right 03/25/2014   Procedure: TOTAL KNEE ARTHROPLASTY;  Surgeon: Dannielle Huh, MD;  Location: [facility] OR;  Service: Orthopedics;  Laterality: Right;    Social History   Tobacco Use   Smoking status: Never   Smokeless tobacco: Never  Vaping Use   Vaping status: Never Used  Substance Use Topics   Alcohol use: No     Alcohol/week: 0.0 standard drinks of alcohol   Drug use: No    Family History  Problem Relation Age of Onset   Heart disease Mother    Rheum arthritis Mother    Alzheimer's disease Mother    Heart disease Father    Diabetes Sister    Heart disease Sister    Hyperlipidemia Sister    Cancer Brother    Heart disease Brother    Hyperlipidemia Brother    Hyperlipidemia Sister    Heart disease Sister    Diabetes Sister     Allergies  Allergen Reactions   Statins     Body aches, has tried several times and does not tolerate well   Contrast Media [Iodinated Contrast Media] Hives, Rash and Other (See Comments)    ALL OVER THE BODY   Ioxaglate Rash and Hives    ALL OVER THE BODY    Medication list has been reviewed and updated.  Current Outpatient Medications on File Prior to Visit  Medication Sig Dispense Refill   celecoxib (CELEBREX) 200 MG capsule Take 1 capsule (200 mg total) by mouth 2 (two) times daily. 180 capsule 0   cyanocobalamin (VITAMIN B12) 1000 MCG/ML injection Inject 1 mL (1,000 mcg total) into the muscle every 30 (thirty) days. 10 mL 0   cyclobenzaprine (FLEXERIL) 10 MG tablet Take 1 tablet (10 mg total) by mouth 2 (two) times daily as needed for muscle spasms. 15 tablet 0   Multiple Vitamin (MULTIVITAMIN WITH MINERALS) TABS tablet Take 1 tablet by mouth daily.     omeprazole (PRILOSEC) 40 MG capsule Take 1 capsule (40 mg total) by mouth daily. 90 capsule 3   ondansetron (ZOFRAN) 8 MG tablet Take 1 tablet (8 mg total) by mouth every 8 (eight) hours as needed for nausea or vomiting. 30 tablet 0   rosuvastatin (CRESTOR) 5 MG tablet Take 1 tablet (5 mg total) by mouth daily. 90 tablet 3   No current facility-administered medications on file prior to visit.    Review of Systems: As per HPI- otherwise negative.   Physical Examination: Vitals:   09/12/23 1316  BP: 122/80  Pulse: 79  Resp: 18  Temp: 97.6 F (36.4 C)  SpO2: 97%   Vitals:   09/12/23 1316   Weight: 188 lb (85.3 kg)  Height: 5\' 2"  (1.575 m)   Body mass index is 34.39 kg/m. Ideal Body Weight: Weight in (lb) to have BMI = 25: 136.4  GEN: no acute distress. Obese, looks well  HEENT: Atraumatic, Normocephalic.  Bilateral TM wnl, oropharynx normal.  PEERL,EOMI.   Ears and Nose: No external deformity. CV: RRR, No M/G/R. No JVD. No thrill. No extra heart sounds. PULM: CTA B, no wheezes, crackles, rhonchi. No retractions. No resp. distress. No accessory muscle use. ABD: S, NT, ND, +BS. No rebound. No HSM. EXTR: No c/c/e PSYCH: Normally interactive.  Conversant.  Left hip - tender over left lateral hip suggesting trochanteric bursitis.  Otherwise her hip exam is relatively normal.  Normal strength of hip flexors, quads and hamstrings.  I am not able to reproduce her hip pain otherwise  Assessment and Plan: Pernicious anemia - Plan: CBC, Vitamin B12  Dyslipidemia - Plan: Lipid panel  Controlled type 2 diabetes mellitus with complication, without long-term current use of insulin (HCC) - Plan: Comprehensive metabolic panel, Hemoglobin A1c, traMADol (ULTRAM) 50 MG tablet  Thyroid disorder screening - Plan: TSH  Left hip pain - Plan: DG Hip Unilat W OR W/O Pelvis 2-3 Views Left  Estrogen deficiency - Plan: DG Bone Density  Patient seen today for follow-up.  Monitor her history of pernicious anemia with a CBC and B12 level, monitor her cholesterol and A1c today Screen for thyroid abnormality Ordered bone density Gave flu shot Left hip pain for about 3 years, and exact diagnosis has not been forthcoming.  She has been seen by orthopedics and then MRI.  However no imaging in over 2 years.  We decided to obtain plain x-rays today to look for any evidence of worsening arthritic changes.  I also did give her some tramadol to use as needed when her pain is more severe in hopes of allowing her to be more active.  I advised her this is a narcotic and can cause sedation  Signed Abbe Amsterdam, MD  Received x-ray and bone density as below, message to patient  DG Hip Unilat W OR W/O Pelvis 2-3 Views Left Result Date: 09/12/2023 CLINICAL DATA:  Left hip pain for the past 3 years. EXAM: DG HIP (WITH OR WITHOUT PELVIS) 2-3V LEFT COMPARISON:  11/14/2020 FINDINGS: The left hip continues to have a normal appearance as does the right hip. Stable lower lumbar spine degenerative changes. Mild bilateral sacroiliac joint degenerative changes IMPRESSION: 1. No acute abnormality. 2. Stable lower lumbar spine degenerative changes. 3. Mild bilateral sacroiliac joint degenerative changes. Electronically Signed   By: Beckie Salts M.D.   On: 09/12/2023 17:12   DG Bone Density Result Date: 09/12/2023 EXAM: DUAL X-RAY ABSORPTIOMETRY (DXA) FOR BONE MINERAL DENSITY IMPRESSION: Peggy Santiago C Danetra Glock Your patient Peggy Santiago completed a BMD test on 09/12/2023 using the Lunar IDXA DXA System (analysis version: 16.SP2) manufactured by [employer]. The following summarizes the results of our evaluation. [facility] PATIENT: Name: Peggy Santiago, Peggy Santiago Patient ID: [id number] Birth Date: [birth year] Height: 62.0 in. Gender: Female Measured: 09/12/2023 Weight: 188.0 lbs. Indications: Advanced Age, Caucasian, Estrogen Deficiency, History of Fracture (Adult), Hysterectomy, Low Calcium Intake, Oophorectomy(Unilateral), Parent hip Fx, Post Menopausal Fractures: Ankle, Knee, Nose Treatments: Vitamin D ASSESSMENT: The BMD measured at Femur Neck Right is 0.721 g/cm2 with a T-score of -2.3. This patient is considered osteopenic according to World Health Organization [facility]) criteria. Compared with the prior study on,09/03/2021 the BMD of the total mean shows a statistically significant increase. The scan quality is good. Site Region Measured Date Measured Age WHO YA BMD Classification T-score AP Spine L1-L4 09/12/2023 71.3 Normal -0.2 1.151 g/cm2 AP Spine L1-L4 09/03/2021 69.3 Normal -0.5 1.116 g/cm2 AP Spine L1-L4 07/31/2019 67.2 Normal -0.4  1.135 g/cm2 AP Spine L1-L4 07/01/2017 65.1 Normal -0.2 1.152 g/cm2 DualFemur Neck Right 09/12/2023 71.3 Low Bone Mass -2.3 0.721 g/cm2 DualFemur Neck Right 09/03/2021 69.3 Low Bone Mass -2.2 0.728 g/cm2 DualFemur Neck Right 07/31/2019 67.2 Low Bone Mass -2.1 0.742 g/cm2 DualFemur Neck Right 07/01/2017 65.1 Low Bone Mass -2.0 0.763 g/cm2 DualFemur Total Mean 09/12/2023  71.3 Normal -1.0 0.880 g/cm2 DualFemur Total Mean 09/03/2021 69.3 - - 0.856 g/cm2 DualFemur Total Mean 07/31/2019 67.2 - - 0.882 g/cm2 DualFemur Total Mean 07/01/2017 65.1 - - 0.870 g/cm2 World Health Organization [facility]) criteria for post-menopausal, Caucasian Women: Normal        T-score at or above -1 SD Low Bone Mass T-score between -1 and -2.5 SD Osteoporosis  T-score at or below -2.5 SD RECOMMENDATION: 1. All patients should optimize calcium and vitamin D intake. 2. Consider FDA-approved medical therapies in postmenopausal women and men aged 34 years and older, based on the following: a. A hip or vertebral(clinical or morphometric) fracture. b. T-Score < -2.5 at the femoral neck or spine after appropriate evaluation to exclude secondary causes c. Low bone mass (T-score between -1.0 and -2.5 at the femoral neck or spine) and a 10 year probability of a hip fracture >3% or a 10 year probability of major osteoporosis-related fracture > 20% based on the [place]-adapted WHO algorithm d. Clinical judgement and/or patient preferences may indicate treatment for people with 10-year fracture probabilities above or below these levels FOLLOW-UP: Patients with diagnosis of osteoporosis or at high risk for fracture should have regular bone mineral density tests. For patients eligible for Medicare, routine testing is allowed once every 2 years. The testing frequency can be increased to one year for patients who have rapidly progressing disease, those who are receiving or discontinuing medical therapy to restore bone mass, or have additional risk factors. I have  reviewed this report, and agree with the above findings. [facility] Radiology Patient: Peggy Santiago Referring Physician: Pearline Cables Birth Date: Jan 27, 1951 [age] Patient ID: [id number] Height: 62.0 in. Weight: 188.0 lbs. Measured: 09/12/2023 2:23:01 PM (16 SP 4) Gender: Female Ethnicity: White Analyzed: 09/12/2023 2:36:06 PM (16 SP 4) FRAX* 10-year Probability of Fracture Based on femoral neck BMD: DualFemur (Right) Major Osteoporotic Fracture: 31.1% Hip Fracture:                11.3% Population:                  [place] (Caucasian) Risk Factors: History of Fracture (Adult), Family Hist. (Parent hip fracture) *FRAX is a Armed forces logistics/support/administrative officer of the [employer] for Metabolic Bone Disease, a World Science writer (WHO) [employer]. ASSESSMENT: The probability of a major osteoporotic fracture is 31.1% within the next ten years. The probability of a hip fracture is 11.3% within the next ten years. Electronically Signed   By: Frederico Hamman M.D.   On: 09/12/2023 15:09   Addendum 3/4, received labs as below.  Message to patient  Results for orders placed or performed in visit on 09/12/23  CBC   Collection Time: 09/12/23  2:09 PM  Result Value Ref Range   WBC 6.9 4.0 - 10.5 K/uL   RBC 4.12 3.87 - 5.11 Mil/uL   Platelets 262.0 150.0 - 400.0 K/uL   Hemoglobin 12.8 12.0 - 15.0 g/dL   HCT 40.9 81.1 - 91.4 %   MCV 93.4 78.0 - 100.0 fl   MCHC 33.1 30.0 - 36.0 g/dL   RDW 78.2 95.6 - 21.3 %  Comprehensive metabolic panel   Collection Time: 09/12/23  2:09 PM  Result Value Ref Range   Sodium 141 135 - 145 mEq/L   Potassium 4.3 3.5 - 5.1 mEq/L   Chloride 107 96 - 112 mEq/L   CO2 27 19 - 32 mEq/L   Glucose,  Bld 92 70 - 99 mg/dL   BUN 22 6 - 23 mg/dL   Creatinine, Ser 1.61 0.40 - 1.20 mg/dL   Total Bilirubin 0.3 0.2 - 1.2 mg/dL   Alkaline Phosphatase 64 39 - 117 U/L   AST 13 0 - 37 U/L   ALT 16 0 - 35 U/L   Total Protein 6.6 6.0 - 8.3 g/dL    Albumin 4.1 3.5 - 5.2 g/dL   GFR 09.60 >45.40 mL/min   Calcium 9.2 8.4 - 10.5 mg/dL  Hemoglobin J8J   Collection Time: 09/12/23  2:09 PM  Result Value Ref Range   Hgb A1c MFr Bld 6.6 (H) 4.6 - 6.5 %  Lipid panel   Collection Time: 09/12/23  2:09 PM  Result Value Ref Range   Cholesterol 204 (H) 0 - 200 mg/dL   Triglycerides 191.4 (H) 0.0 - 149.0 mg/dL   HDL 78.29 (L) >56.21 mg/dL   VLDL 30.8 (H) 0.0 - 65.7 mg/dL   LDL Cholesterol 846 (H) 0 - 99 mg/dL   Total CHOL/HDL Ratio 6    NonHDL 168.35   TSH   Collection Time: 09/12/23  2:09 PM  Result Value Ref Range   TSH 1.05 0.35 - 5.50 uIU/mL  Vitamin B12   Collection Time: 09/12/23  2:09 PM  Result Value Ref Range   Vitamin B-12 285 211 - 911 pg/mL

## 2023-09-10 DIAGNOSIS — H269 Unspecified cataract: Secondary | ICD-10-CM

## 2023-09-10 HISTORY — DX: Unspecified cataract: H26.9

## 2023-09-12 ENCOUNTER — Ambulatory Visit (INDEPENDENT_AMBULATORY_CARE_PROVIDER_SITE_OTHER): Payer: Medicare Other | Admitting: Family Medicine

## 2023-09-12 ENCOUNTER — Ambulatory Visit (HOSPITAL_BASED_OUTPATIENT_CLINIC_OR_DEPARTMENT_OTHER)
Admission: RE | Admit: 2023-09-12 | Discharge: 2023-09-12 | Disposition: A | Discharge status: Home / Self Care | Source: Ambulatory Visit | Attending: Family Medicine | Admitting: Family Medicine

## 2023-09-12 ENCOUNTER — Encounter: Payer: Self-pay | Admitting: Family Medicine

## 2023-09-12 ENCOUNTER — Encounter (HOSPITAL_BASED_OUTPATIENT_CLINIC_OR_DEPARTMENT_OTHER): Payer: Self-pay

## 2023-09-12 VITALS — BP 122/80 | HR 79 | Temp 97.6°F | Resp 18 | Ht 62.0 in | Wt 188.0 lb

## 2023-09-12 DIAGNOSIS — Z1231 Encounter for screening mammogram for malignant neoplasm of breast: Secondary | ICD-10-CM | POA: Insufficient documentation

## 2023-09-12 DIAGNOSIS — D510 Vitamin B12 deficiency anemia due to intrinsic factor deficiency: Secondary | ICD-10-CM

## 2023-09-12 DIAGNOSIS — E2839 Other primary ovarian failure: Secondary | ICD-10-CM | POA: Insufficient documentation

## 2023-09-12 DIAGNOSIS — M25552 Pain in left hip: Secondary | ICD-10-CM | POA: Diagnosis not present

## 2023-09-12 DIAGNOSIS — Z23 Encounter for immunization: Secondary | ICD-10-CM

## 2023-09-12 DIAGNOSIS — E785 Hyperlipidemia, unspecified: Secondary | ICD-10-CM

## 2023-09-12 DIAGNOSIS — E118 Type 2 diabetes mellitus with unspecified complications: Secondary | ICD-10-CM

## 2023-09-12 DIAGNOSIS — Z1329 Encounter for screening for other suspected endocrine disorder: Secondary | ICD-10-CM | POA: Diagnosis not present

## 2023-09-12 DIAGNOSIS — M47816 Spondylosis without myelopathy or radiculopathy, lumbar region: Secondary | ICD-10-CM | POA: Diagnosis not present

## 2023-09-12 DIAGNOSIS — M85851 Other specified disorders of bone density and structure, right thigh: Secondary | ICD-10-CM | POA: Diagnosis not present

## 2023-09-12 DIAGNOSIS — Z780 Asymptomatic menopausal state: Secondary | ICD-10-CM | POA: Diagnosis not present

## 2023-09-12 MED ORDER — TRAMADOL HCL 50 MG PO TABS: 50.0000 mg | ORAL_TABLET | Freq: Three times a day (TID) | ORAL | 0 refills | Status: DC | PRN

## 2023-09-13 ENCOUNTER — Inpatient Hospital Stay (HOSPITAL_BASED_OUTPATIENT_CLINIC_OR_DEPARTMENT_OTHER): Admission: RE | Admit: 2023-09-13 | Payer: Medicare Other | Source: Ambulatory Visit

## 2023-09-13 ENCOUNTER — Encounter: Payer: Self-pay | Admitting: Family Medicine

## 2023-09-13 LAB — COMPREHENSIVE METABOLIC PANEL
ALT: 16 U/L (ref 0–35)
AST: 13 U/L (ref 0–37)
Albumin: 4.1 g/dL (ref 3.5–5.2)
Alkaline Phosphatase: 64 U/L (ref 39–117)
BUN: 22 mg/dL (ref 6–23)
CO2: 27 meq/L (ref 19–32)
Calcium: 9.2 mg/dL (ref 8.4–10.5)
Chloride: 107 meq/L (ref 96–112)
Creatinine, Ser: 0.88 mg/dL (ref 0.40–1.20)
GFR: 66.16 mL/min (ref >60.00)
Glucose, Bld: 92 mg/dL (ref 70–99)
Potassium: 4.3 meq/L (ref 3.5–5.1)
Sodium: 141 meq/L (ref 135–145)
Total Bilirubin: 0.3 mg/dL (ref 0.2–1.2)
Total Protein: 6.6 g/dL (ref 6.0–8.3)

## 2023-09-13 LAB — LIPID PANEL
Cholesterol: 204 mg/dL — ABNORMAL HIGH (ref 0–200)
HDL: 35.5 mg/dL — ABNORMAL LOW (ref >39.00)
LDL Cholesterol: 120 mg/dL — ABNORMAL HIGH (ref 0–99)
NonHDL: 168.35
Total CHOL/HDL Ratio: 6
Triglycerides: 244 mg/dL — ABNORMAL HIGH (ref 0.0–149.0)
VLDL: 48.8 mg/dL — ABNORMAL HIGH (ref 0.0–40.0)

## 2023-09-13 LAB — CBC
HCT: 38.5 % (ref 36.0–46.0)
Hemoglobin: 12.8 g/dL (ref 12.0–15.0)
MCHC: 33.1 g/dL (ref 30.0–36.0)
MCV: 93.4 fl (ref 78.0–100.0)
Platelets: 262 10*3/uL (ref 150.0–400.0)
RBC: 4.12 Mil/uL (ref 3.87–5.11)
RDW: 13.8 % (ref 11.5–15.5)
WBC: 6.9 10*3/uL (ref 4.0–10.5)

## 2023-09-13 LAB — TSH: TSH: 1.05 u[IU]/mL (ref 0.35–5.50)

## 2023-09-13 LAB — HEMOGLOBIN A1C: Hgb A1c MFr Bld: 6.6 % — ABNORMAL HIGH (ref 4.6–6.5)

## 2023-09-13 LAB — VITAMIN B12: Vitamin B-12: 285 pg/mL (ref 211–911)

## 2023-09-16 DIAGNOSIS — R7303 Prediabetes: Secondary | ICD-10-CM | POA: Diagnosis not present

## 2023-09-16 DIAGNOSIS — H04123 Dry eye syndrome of bilateral lacrimal glands: Secondary | ICD-10-CM | POA: Diagnosis not present

## 2023-09-16 DIAGNOSIS — H2513 Age-related nuclear cataract, bilateral: Secondary | ICD-10-CM | POA: Diagnosis not present

## 2023-09-16 LAB — HM DIABETES EYE EXAM

## 2023-09-19 ENCOUNTER — Ambulatory Visit (INDEPENDENT_AMBULATORY_CARE_PROVIDER_SITE_OTHER): Admitting: Pharmacist

## 2023-09-19 ENCOUNTER — Other Ambulatory Visit (HOSPITAL_BASED_OUTPATIENT_CLINIC_OR_DEPARTMENT_OTHER)

## 2023-09-19 DIAGNOSIS — E785 Hyperlipidemia, unspecified: Secondary | ICD-10-CM

## 2023-09-19 NOTE — Progress Notes (Signed)
 09/19/2023 Name: Peggy Santiago MRN: [id number] DOB: 16-Dec-1950  Chief Complaint  Patient presents with   Hyperlipidemia    Peggy Santiago is a 73 y.o. year old female who presented for a telephone visit.   They were referred to the pharmacist by their PCP for assistance in managing hyperlipidemia.    Subjective:  Medication Access/Adherence  Current Pharmacy:  [facility] [address], [place] - [address] Peggy Santiago [place] [address] Phone: [phone] Fax: [phone]   Patient reports affordability concerns with their medications: Yes  Patient reports access/transportation concerns to their pharmacy: No  Patient reports adherence concerns with their medications:  Yes     Hyperlipidemia/ASCVD Risk Reduction  Current lipid lowering medications: rosuvastatin 5mg  3 times a week (Rx if for daily)   Medications tried in the past: atorvastatin, pravastatin, simvastatin - call caused myalgias  Dr Patsy Lager has discussed starting PCSK9 with patient in the past but cost has been an issue. Today patient expressed reluctance to take an injection.    Objective:  Lab Results  Component Value Date   HGBA1C 6.6 (H) 09/12/2023    Lab Results  Component Value Date   CREATININE 0.88 09/12/2023   BUN 22 09/12/2023   NA 141 09/12/2023   K 4.3 09/12/2023   CL 107 09/12/2023   CO2 27 09/12/2023    Lab Results  Component Value Date   CHOL 204 (H) 09/12/2023   HDL 35.50 (L) 09/12/2023   LDLCALC 120 (H) 09/12/2023   LDLDIRECT 161.0 08/25/2022   TRIG 244.0 (H) 09/12/2023   CHOLHDL 6 09/12/2023    Medications Reviewed Today     Reviewed by Henrene Pastor, RPH-CPP (Pharmacist) on 09/19/23 at 1317  Med List Status: <None>   Medication Order Taking? Sig Documenting Provider Last Dose Status Informant  celecoxib (CELEBREX) 200 MG capsule [id number] Yes Take 1 capsule (200 mg total) by mouth 2 (two) times daily. Copland, Gwenlyn Found, MD Taking  Active   Co-Enzyme Q10 100 MG CAPS [id number] Yes Take 100 mg by mouth daily. [provider] Taking Active   cyanocobalamin (VITAMIN B12) 1000 MCG/ML injection [id number] Yes Inject 1 mL (1,000 mcg total) into the muscle every 30 (thirty) days. Copland, Gwenlyn Found, MD Taking Active   cyclobenzaprine (FLEXERIL) 10 MG tablet [id number]  Take 1 tablet (10 mg total) by mouth 2 (two) times daily as needed for muscle spasms. Copland, Gwenlyn Found, MD  Active   Multiple Vitamin (MULTIVITAMIN WITH MINERALS) TABS tablet [id number] Yes Take 1 tablet by mouth daily. [provider] Taking Active Self  omeprazole (PRILOSEC) 40 MG capsule [id number] Yes Take 1 capsule (40 mg total) by mouth daily. Copland, Gwenlyn Found, MD Taking Active   ondansetron (ZOFRAN) 8 MG tablet [id number]  Take 1 tablet (8 mg total) by mouth every 8 (eight) hours as needed for nausea or vomiting. Copland, Gwenlyn Found, MD  Active   rosuvastatin (CRESTOR) 5 MG tablet [id number] Yes Take 1 tablet (5 mg total) by mouth daily.  Patient taking differently: Take 5 mg by mouth 3 (three) times a week.   Copland, Gwenlyn Found, MD Taking Active               Assessment/Plan:   Hyperlipidemia/ASCVD Risk Reduction: LDL and Tg not at goals (LDL < 100 and Tg < 150) though LDL has decreased significantly over the last year. Patient has not tolerated statins well in the past.  - Reviewed long term complications  of uncontrolled cholesterol - Discussed pros and cons of PCSK9 agents like Repatha. Noted to patient that side effects are rare with these agents and reassured her that risk of myalgias is also low.  - Reviewed her 60 [employer] Medicare Part D plan. Patient has $573 deductible and then Repatha or Praulent copay would be 20% of medication cost or around $125 / month. I think she might qualify for [facility] that would likely cover any remaining cost and her deductible.  - Patient would like to try to increase dose of rosuvastatin  from 5mg  3 times per week to take 1 tablet daily. Will try this for 4 to 6 weeks and recheck lipids. If not at goal then next steps would be to add PCSK9 or ezetimibe.    Follow Up Plan: 6 weeks  Henrene Pastor, PharmD Clinical Pharmacist [facility]

## 2023-10-19 ENCOUNTER — Other Ambulatory Visit: Payer: Self-pay | Admitting: Family Medicine

## 2023-10-19 DIAGNOSIS — M25552 Pain in left hip: Secondary | ICD-10-CM

## 2023-10-21 ENCOUNTER — Ambulatory Visit: Admitting: Medical

## 2023-10-21 VITALS — BP 140/72 | HR 64 | Resp 18 | Ht 62.0 in | Wt 186.0 lb

## 2023-10-21 DIAGNOSIS — E118 Type 2 diabetes mellitus with unspecified complications: Secondary | ICD-10-CM

## 2023-10-21 DIAGNOSIS — M25552 Pain in left hip: Secondary | ICD-10-CM | POA: Diagnosis not present

## 2023-10-21 MED ORDER — METHYLPREDNISOLONE 4 MG PO TABS: ORAL_TABLET | ORAL | 0 refills | Status: DC

## 2023-10-21 NOTE — Progress Notes (Signed)
   Subjective:    Patient ID: Peggy Santiago, female    DOB: [birth year], 73 y.o.   MRN: [id number]  HPI Discussed the use of AI scribe software for clinical note transcription with the patient, who gave verbal consent to proceed.  History of Present Illness   Peggy Santiago is a 73 year old female who presents with worsening chronic hip pain.  She has been experiencing bilateral hip pain for approximately three years, with the left side being more affected. The pain never fully resolves but fluctuates in intensity, with periods of easing off and flare-ups. Currently, she is experiencing a significant flare-up.  An MRI conducted about three years ago led to a diagnosis of bursitis by an orthopedic specialist. Recent imaging, including an X-ray from March 3rd, showed degenerative changes in the SI joints and stable degenerative changes in the lumbar spine, but both hips appeared normal in joint space. Despite these findings, she continues to experience sharp pain radiating from the hip down to the ankle, raising concerns about potential sciatic nerve involvement, although she has no history of sciatica.  She is currently using a lidocaine patch for pain management and has been prescribed tramadol, which she takes in half doses due to drowsiness. She also takes Celebrex occasionally for pain relief.  She is planning a vacation to [place] and has recently returned from a trip to [place], which involved significant physical activity, potentially exacerbating her hip pain.  In addition to her hip pain, she is currently on amoxicillin for dental issues related to bridge work, as prescribed by her dentist.        Review of Systems See hpi    Objective:   Physical Exam  General- No acute distress. Pleasant patient. Neck- Full range of motion, no jvd Lungs- Clear, even and unlabored. Heart- regular rate and rhythm. Neurologic- CNII- XII grossly intact.  Rt hip- no pain on rom or  palpation. Lt hp- pain on rom and pain on palpation in tronchantic bursa area.      Assessment & Plan:   Assessment and Plan    Bilateral Hip Pain(left pai worse) probable bursitis. Chronic bilateral hip pain with degenerative SI joint changes.  Previous treatments provided temporary relief. Medrol considered for pain management during increased activity. - Refer to sports med Dr. Richardean Sale for further evaluation. - Prescribe Medrol dose pack for vacation. - Advise monitoring activity levels and avoiding overexertion. - Instruct to use tramadol for breakthrough pain. - Advise against ibuprofen or Alev or celebrex  while on Medrol. - Monitor blood glucose levels on Medrol.(risk vs benefit discussed)    Diabetes . Discussed importance of monitoring glucose levels and dietary management, especially while on Medrol. - Monitor blood glucose levels twice daily and report trends post-vacation.   Follow up date to be determined depending on when you can get in with sports med MD        Esperanza Richters, PA-C

## 2023-10-21 NOTE — Patient Instructions (Signed)
 Bilateral Hip Pain(left pai worse) probable bursitis. Chronic bilateral hip pain with degenerative SI joint changes.  Previous treatments provided temporary relief. Medrol considered for pain management during increased activity. - Refer to sports med Dr. Richardean Sale for further evaluation. - Prescribe Medrol dose pack for vacation. - Advise monitoring activity levels and avoiding overexertion. - Instruct to use tramadol for breakthrough pain. - Advise against ibuprofen or Alev or celebrex  while on Medrol. - Monitor blood glucose levels on Medrol.(risk vs benefit discussed)    Diabetes . Discussed importance of monitoring glucose levels and dietary management, especially while on Medrol. - Monitor blood glucose levels twice daily and report trends post-vacation.  Follow up date to be determined depending on when you can get in with sports med MD

## 2023-11-01 NOTE — Progress Notes (Unsigned)
    Ben Jackson D.[facility] [address] [place] [address] Phone: [phone]   Assessment and Plan:     There are no diagnoses linked to this encounter.  ***   Pertinent previous records reviewed include ***    Follow Up: ***     Subjective:   I, Peggy Santiago, am serving as a Neurosurgeon for Doctor Ulysees Gander  Chief Complaint: left hip pain   HPI:   11/02/2023 Patient is a 73 year old female with left hip pain. Patient states   Relevant Historical Information: ***  Additional pertinent review of systems negative.   Current Outpatient Medications:    celecoxib  (CELEBREX ) 200 MG capsule, Take 1 capsule (200 mg total) by mouth 2 (two) times daily., Disp: 180 capsule, Rfl: 0   Co-Enzyme Q10 100 MG CAPS, Take 100 mg by mouth daily., Disp: , Rfl:    cyanocobalamin  (VITAMIN B12) 1000 MCG/ML injection, Inject 1 mL (1,000 mcg total) into the muscle every 30 (thirty) days., Disp: 10 mL, Rfl: 0   cyclobenzaprine  (FLEXERIL ) 10 MG tablet, Take 1 tablet (10 mg total) by mouth 2 (two) times daily as needed for muscle spasms., Disp: 15 tablet, Rfl: 0   methylPREDNISolone  (MEDROL ) 4 MG tablet, Standard 6 day taper dose pack, Disp: 21 tablet, Rfl: 0   Multiple Vitamin (MULTIVITAMIN WITH MINERALS) TABS tablet, Take 1 tablet by mouth daily., Disp: , Rfl:    omeprazole  (PRILOSEC) 40 MG capsule, Take 1 capsule (40 mg total) by mouth daily., Disp: 90 capsule, Rfl: 3   ondansetron  (ZOFRAN ) 8 MG tablet, Take 1 tablet (8 mg total) by mouth every 8 (eight) hours as needed for nausea or vomiting., Disp: 30 tablet, Rfl: 0   rosuvastatin  (CRESTOR ) 5 MG tablet, Take 1 tablet (5 mg total) by mouth daily. (Patient taking differently: Take 5 mg by mouth 3 (three) times a week.), Disp: 90 tablet, Rfl: 3   Objective:     There were no vitals filed for this visit.    There is no height or weight on file to calculate BMI.    Physical Exam:     ***   Electronically signed by:  Marshall Skeeter D.[facility] 7:46 AM 11/01/23

## 2023-11-02 ENCOUNTER — Ambulatory Visit: Admitting: Pharmacist

## 2023-11-02 ENCOUNTER — Ambulatory Visit (INDEPENDENT_AMBULATORY_CARE_PROVIDER_SITE_OTHER)

## 2023-11-02 ENCOUNTER — Ambulatory Visit: Admitting: Sports Medicine

## 2023-11-02 VITALS — BP 142/80 | HR 68 | Ht 62.0 in | Wt 183.0 lb

## 2023-11-02 DIAGNOSIS — G8929 Other chronic pain: Secondary | ICD-10-CM | POA: Diagnosis not present

## 2023-11-02 DIAGNOSIS — M25552 Pain in left hip: Secondary | ICD-10-CM

## 2023-11-02 DIAGNOSIS — M5442 Lumbago with sciatica, left side: Secondary | ICD-10-CM

## 2023-11-02 DIAGNOSIS — E785 Hyperlipidemia, unspecified: Secondary | ICD-10-CM

## 2023-11-02 DIAGNOSIS — M545 Low back pain, unspecified: Secondary | ICD-10-CM | POA: Diagnosis not present

## 2023-11-02 DIAGNOSIS — M47816 Spondylosis without myelopathy or radiculopathy, lumbar region: Secondary | ICD-10-CM | POA: Diagnosis not present

## 2023-11-02 MED ORDER — EZETIMIBE 10 MG PO TABS: 10.0000 mg | ORAL_TABLET | Freq: Every day | ORAL | 0 refills | Status: DC

## 2023-11-02 NOTE — Progress Notes (Signed)
 11/02/2023 Name: Peggy Santiago MRN: [id number] DOB: [birth year]  Chief Complaint  Patient presents with   Hyperlipidemia    Peggy Santiago is a 73 y.o. year old female who presented for a telephone visit.   They were referred to the pharmacist by their PCP for assistance in managing hyperlipidemia.    Subjective:  Medication Access/Adherence  Current Pharmacy:  [facility] [address], [place] - [address] [place] [address] Phone: [phone] Fax: [phone]   Patient reports affordability concerns with their medications: No  Patient reports access/transportation concerns to their pharmacy: No  Patient reports adherence concerns with their medications:  Yes     Hyperlipidemia/ASCVD Risk Reduction  Current lipid lowering medications: rosuvastatin  5mg  every other day. After our last visit patient tried to take rosuvastatin  daily but after a week she had severe muscle pain. She stopped for a week and pain improved some. She restarted rosuvastatin  5mg  - taking every other day.   Medications tried in the past: atorvastatin , pravastatin , simvastatin  - call caused myalgias  Dr Geralyn Knee has discussed starting PCSK9 with patient in the past but cost has been an issue. Today patient states she would prefer not to take an injection.   Co-Morbidities = type 2 DM  CT Cardiac Scoring 09/06/2022  IMPRESSION: 1. Coronary calcium  score of 7.3. This was 45 percentile for age-, race-, and sex-matched controls.   2.  Aortic atherosclerosis.   RECOMMENDATIONS: Coronary artery calcium  (CAC) score is a strong predictor of incident coronary heart disease (CHD) and provides predictive information beyond traditional risk factors. CAC scoring is reasonable to use in the decision to withhold, postpone, or initiate statin therapy in intermediate-risk or selected borderline-risk asymptomatic adults (age 76-75 years and LDL-C >=70 to <190  mg/dL) who do not have diabetes or established atherosclerotic cardiovascular disease (ASCVD).* In intermediate-risk (10-year ASCVD risk >=7.5% to <20%) adults or selected borderline-risk (10-year ASCVD risk >=5% to <7.5%) adults in whom a CAC score is measured for the purpose of making a treatment decision the following recommendations have been made:   If CAC=0, it is reasonable to withhold statin therapy and reassess in 5 to 10 years, as long as higher risk conditions are absent (diabetes mellitus, family history of premature CHD in first degree relatives (males <55 years; females <65 years), cigarette smoking, or LDL >=190 mg/dL).   If CAC is 1 to 99, it is reasonable to initiate statin therapy for patients >=109 years of age.  Objective:  Lab Results  Component Value Date   HGBA1C 6.6 (H) 09/12/2023    Lab Results  Component Value Date   CREATININE 0.88 09/12/2023   BUN 22 09/12/2023   NA 141 09/12/2023   K 4.3 09/12/2023   CL 107 09/12/2023   CO2 27 09/12/2023    Lab Results  Component Value Date   CHOL 204 (H) 09/12/2023   HDL 35.50 (L) 09/12/2023   LDLCALC 120 (H) 09/12/2023   LDLDIRECT 161.0 08/25/2022   TRIG 244.0 (H) 09/12/2023   CHOLHDL 6 09/12/2023    Medications Reviewed Today     Reviewed by Cecilie Coffee, RPH-CPP (Pharmacist) on 11/02/23 at 0900  Med List Status: <None>   Medication Order Taking? Sig Documenting Provider Last Dose Status Informant  celecoxib  (CELEBREX ) 200 MG capsule [id number] Yes Take 1 capsule (200 mg total) by mouth 2 (two) times daily. Copland, Skipper Dumas, MD Taking Active   Co-Enzyme Q10 100 MG CAPS [id number] Yes  Take 100 mg by mouth daily. [provider] Taking Active   cyanocobalamin  (VITAMIN B12) 1000 MCG/ML injection [id number] Yes Inject 1 mL (1,000 mcg total) into the muscle every 30 (thirty) days. Copland, Skipper Dumas, MD Taking Active   cyclobenzaprine  (FLEXERIL ) 10 MG tablet [id number] Yes Take 1 tablet (10 mg  total) by mouth 2 (two) times daily as needed for muscle spasms. Copland, Jessica C, MD Taking Active   Multiple Vitamin (MULTIVITAMIN WITH MINERALS) TABS tablet [id number]  Take 1 tablet by mouth daily. [provider]  Active Self  omeprazole  (PRILOSEC) 40 MG capsule [id number] Yes Take 1 capsule (40 mg total) by mouth daily. Copland, Skipper Dumas, MD Taking Active   rosuvastatin  (CRESTOR ) 5 MG tablet [id number] Yes Take 1 tablet (5 mg total) by mouth daily.  Patient taking differently: Take 5 mg by mouth 3 (three) times a week.   Copland, Skipper Dumas, MD Taking Active               Assessment/Plan:   Hyperlipidemia/ASCVD Risk Reduction: LDL and Tg not at goals (LDL < 100 and Tg < 150) though LDL has decreased significantly over the last year. Patient has not tolerated statins in the past.  - Reviewed long term complications of uncontrolled cholesterol - Discussed pros and cons of PCSK9 agents like Repatha . Noted to patient that side effects are rare with these agents and reassured her that risk of myalgias is also low.  - Reviewed her 4 [employer] Medicare Part D plan. Patient has $573 deductible and then Repatha  or Praulent copay would be 20% of medication cost or around $125 / month. I think she might qualify for [employer] that would likely cover any remaining cost and her deductible.  - Add ezetimibe  10mg  daily; Continue rosuvastatin  5mg  3 times per week. - Discussed possibly starting Ozempic for type 2 DM, weight loss and cardiac benefits - patient is considering.   Follow Up Plan: 6 to 8 weeks.  Cecilie Coffee, PharmD Clinical Pharmacist [facility]

## 2023-11-02 NOTE — Patient Instructions (Addendum)
 Xrays on the way out  Tylenol  [phone] mg 2-3 times a day for pain relief  May use Celebrex  for breakthrough pain no more than 1-2 times per week  Glute HEP  2 week follow up

## 2023-11-09 ENCOUNTER — Encounter: Payer: Self-pay | Admitting: Sports Medicine

## 2023-11-15 NOTE — Progress Notes (Unsigned)
    Ben Jackson D.[facility] [address] [place] [address] Phone: [phone]   Assessment and Plan:     There are no diagnoses linked to this encounter.  ***   Pertinent previous records reviewed include ***    Follow Up: ***     Subjective:   I, Amarrah Meinhart, am serving as a Neurosurgeon for Doctor Ulysees Gander  Chief Complaint: left hip pain    HPI:    11/02/2023 Patient is a 73 year old female with left hip pain. Patient states been everywhere and done everything and no once can help her (injections, medications, etc). Just finished 7 day z-pack. Try not to take strong medication. Tried CBD oinments as well. Pain does radiate down her let down to her feet. The pain is on both sides but the left is more than the right. Can not lay on left side without pain. Pain wakes her up at night. Has to move very slowly to get anywhere now but used to be able to out walk most people. Pain is located on the panty line and back of the butt cheeks.   Duration? About 3 years ago Did you have an Injury to cause this pain? no Taking Medication for pain? tramadol  Numbness or Tingling? no Does the pain Radiate? yes Altered gait or use? yes ROM/ impairment of movement? yes     11/16/2023 Patient states  Relevant Historical Information: IBS, DM type II   Additional pertinent review of systems negative.   Current Outpatient Medications:    celecoxib  (CELEBREX ) 200 MG capsule, Take 1 capsule (200 mg total) by mouth 2 (two) times daily., Disp: 180 capsule, Rfl: 0   Co-Enzyme Q10 100 MG CAPS, Take 100 mg by mouth daily., Disp: , Rfl:    cyanocobalamin  (VITAMIN B12) 1000 MCG/ML injection, Inject 1 mL (1,000 mcg total) into the muscle every 30 (thirty) days., Disp: 10 mL, Rfl: 0   cyclobenzaprine  (FLEXERIL ) 10 MG tablet, Take 1 tablet (10 mg total) by mouth 2 (two) times daily as needed for muscle spasms., Disp: 15 tablet, Rfl: 0   ezetimibe  (ZETIA ) 10  MG tablet, Take 1 tablet (10 mg total) by mouth daily., Disp: 60 tablet, Rfl: 0   Multiple Vitamin (MULTIVITAMIN WITH MINERALS) TABS tablet, Take 1 tablet by mouth daily., Disp: , Rfl:    omeprazole  (PRILOSEC) 40 MG capsule, Take 1 capsule (40 mg total) by mouth daily., Disp: 90 capsule, Rfl: 3   rosuvastatin  (CRESTOR ) 5 MG tablet, Take 1 tablet (5 mg total) by mouth every other day., Disp: , Rfl:    Objective:     There were no vitals filed for this visit.    There is no height or weight on file to calculate BMI.    Physical Exam:    ***   Electronically signed by:  Marshall Skeeter [facility] 7:50 AM 11/15/23

## 2023-11-16 ENCOUNTER — Ambulatory Visit: Admitting: Sports Medicine

## 2023-11-16 VITALS — BP 132/84 | HR 79 | Ht 62.0 in | Wt 184.0 lb

## 2023-11-16 DIAGNOSIS — M25552 Pain in left hip: Secondary | ICD-10-CM

## 2023-11-16 DIAGNOSIS — M51362 Other intervertebral disc degeneration, lumbar region with discogenic back pain and lower extremity pain: Secondary | ICD-10-CM

## 2023-11-16 DIAGNOSIS — G8929 Other chronic pain: Secondary | ICD-10-CM

## 2023-11-16 MED ORDER — CELECOXIB 200 MG PO CAPS: 200.0000 mg | ORAL_CAPSULE | Freq: Two times a day (BID) | ORAL | Status: DC | PRN

## 2023-11-16 NOTE — Patient Instructions (Signed)
 MRI referral  Continue Tylenol  [phone] mg 2-3 times a day for pain relief  Continue Celebrex  daily as needed limit to 1-2 times per week  Continue lidocaine  patches  Follow up 1 week after MRI to discuss results

## 2023-11-19 ENCOUNTER — Ambulatory Visit

## 2023-11-19 DIAGNOSIS — M5136 Other intervertebral disc degeneration, lumbar region with discogenic back pain only: Secondary | ICD-10-CM | POA: Diagnosis not present

## 2023-11-19 DIAGNOSIS — M545 Low back pain, unspecified: Secondary | ICD-10-CM | POA: Diagnosis not present

## 2023-11-19 DIAGNOSIS — M51362 Other intervertebral disc degeneration, lumbar region with discogenic back pain and lower extremity pain: Secondary | ICD-10-CM

## 2023-11-19 DIAGNOSIS — M25552 Pain in left hip: Secondary | ICD-10-CM

## 2023-11-19 DIAGNOSIS — G8929 Other chronic pain: Secondary | ICD-10-CM

## 2023-11-19 DIAGNOSIS — M48061 Spinal stenosis, lumbar region without neurogenic claudication: Secondary | ICD-10-CM | POA: Diagnosis not present

## 2023-11-19 DIAGNOSIS — M47816 Spondylosis without myelopathy or radiculopathy, lumbar region: Secondary | ICD-10-CM | POA: Diagnosis not present

## 2023-11-19 DIAGNOSIS — M5135 Other intervertebral disc degeneration, thoracolumbar region: Secondary | ICD-10-CM | POA: Diagnosis not present

## 2023-11-28 NOTE — Progress Notes (Signed)
 Peggy Santiago D.[facility] [address] [place] [address] Phone: [phone]   Assessment and Plan:     1. Chronic bilateral low back pain with left-sided sciatica 2. Degeneration of intervertebral disc of lumbar region with discogenic back pain and lower extremity pain  -Chronic with exacerbation, subsequent visit - Reviewed patient's lumbar MRI from 11/19/2023 which showed foraminal and lateral stenosis, anterolisthesis L4-L5, and L5-S1 - Patient's symptoms and MRI are consistent with lumbar origin of left-sided radicular symptoms.  Recommend epidural CSI to left-sided L4-L5 -Continue Tylenol  500 to 1000 mg tablets 2-3 times a day for day-to-day pain relief -Continue Celebrex  200 mg daily as needed for breakthrough pain.  Recommend limiting chronic NSAIDs to 1-2 doses per week  Pertinent previous records reviewed include lumbar MRI 11/19/2023  Follow Up: 2 weeks after epidural CSI to review benefit   Subjective:   I, Peggy Santiago, am serving as a Neurosurgeon for Doctor Ulysees Gander  Chief Complaint: left hip pain    HPI:    11/02/2023 Patient is a 73 year old female with left hip pain. Patient states been everywhere and done everything and no once can help her (injections, medications, etc). Just finished 7 day z-pack. Try not to take strong medication. Tried CBD oinments as well. Pain does radiate down her let down to her feet. The pain is on both sides but the left is more than the right. Can not lay on left side without pain. Pain wakes her up at night. Has to move very slowly to get anywhere now but used to be able to out walk most people. Pain is located on the panty line and back of the butt cheeks.   Duration? About 3 years ago Did you have an Injury to cause this pain? no Taking Medication for pain? tramadol  Numbness or Tingling? no Does the pain Radiate? yes Altered gait or use? yes ROM/ impairment of movement? yes      11/16/2023 Patient states intermittent pain. 5 am pain has been waking her up  11/29/2023 Patient states she is the same    Relevant Historical Information: IBS, DM type II  Additional pertinent review of systems negative.   Current Outpatient Medications:    celecoxib  (CELEBREX ) 200 MG capsule, Take 1 capsule (200 mg total) by mouth 2 (two) times daily as needed., Disp: , Rfl:    Co-Enzyme Q10 100 MG CAPS, Take 100 mg by mouth daily., Disp: , Rfl:    cyanocobalamin  (VITAMIN B12) 1000 MCG/ML injection, Inject 1 mL (1,000 mcg total) into the muscle every 30 (thirty) days., Disp: 10 mL, Rfl: 0   cyclobenzaprine  (FLEXERIL ) 10 MG tablet, Take 1 tablet (10 mg total) by mouth 2 (two) times daily as needed for muscle spasms., Disp: 15 tablet, Rfl: 0   ezetimibe  (ZETIA ) 10 MG tablet, Take 1 tablet (10 mg total) by mouth daily., Disp: 60 tablet, Rfl: 0   Multiple Vitamin (MULTIVITAMIN WITH MINERALS) TABS tablet, Take 1 tablet by mouth daily., Disp: , Rfl:    omeprazole  (PRILOSEC) 40 MG capsule, Take 1 capsule (40 mg total) by mouth daily., Disp: 90 capsule, Rfl: 3   rosuvastatin  (CRESTOR ) 5 MG tablet, Take 1 tablet (5 mg total) by mouth every other day., Disp: , Rfl:    Objective:     Vitals:   11/29/23 0944  BP: 130/78  Pulse: 87  SpO2: 98%  Weight: 183 lb (83 kg)  Height: 5\' 2"  (1.575 m)  Body mass index is 33.47 kg/m.    Physical Exam:    Gen: Appears well, nad, nontoxic and pleasant Psych: Alert and oriented, appropriate mood and affect Neuro: sensation intact, strength is 5/5 in upper and lower extremities, muscle tone wnl Skin: no susupicious lesions or rashes   Back - Normal skin, Spine with normal alignment and no deformity.   No tenderness to vertebral process palpation.   Bilateral paraspinous muscles are mildly tender and without spasm  TTP gluteal musculature, greater trochanter Straight leg raise positive left, negative right Trendelenberg negative Piriformis  Test negative for radicular pain, though reproduced tightness on left not present on right Gait normal     Electronically signed by:  Marshall Skeeter D[facility] 9:48 AM 11/29/23

## 2023-11-29 ENCOUNTER — Ambulatory Visit (INDEPENDENT_AMBULATORY_CARE_PROVIDER_SITE_OTHER): Admitting: Sports Medicine

## 2023-11-29 VITALS — BP 130/78 | HR 87 | Ht 62.0 in | Wt 183.0 lb

## 2023-11-29 DIAGNOSIS — G8929 Other chronic pain: Secondary | ICD-10-CM | POA: Diagnosis not present

## 2023-11-29 DIAGNOSIS — M51362 Other intervertebral disc degeneration, lumbar region with discogenic back pain and lower extremity pain: Secondary | ICD-10-CM

## 2023-11-29 NOTE — Patient Instructions (Signed)
Epidural left L4-5 Follow up 2 weeks after to discuss results

## 2023-12-01 ENCOUNTER — Telehealth: Payer: Self-pay

## 2023-12-01 MED ORDER — PREDNISONE 50 MG PO TABS: ORAL_TABLET | ORAL | 0 refills | Status: DC

## 2023-12-01 NOTE — Telephone Encounter (Signed)
 Phone call to patient to review instructions for 13 hr prep for epidural injection w/ CT contrast on 12/06/23 at 8:30AM. Prescription called into [facility]. Pt aware and verbalized understanding of instructions. Prescription: 12/05/23 @7 :30PM- 50mg  Prednisone  12/06/23 @1 :30AM- 50mg  Prednisone  12/06/23 @7 :30AM - 50mg  Prednisone  and 50mg  Benadryl 

## 2023-12-02 NOTE — Discharge Instructions (Signed)

## 2023-12-06 ENCOUNTER — Ambulatory Visit
Admission: RE | Admit: 2023-12-06 | Discharge: 2023-12-06 | Disposition: A | Discharge status: Home / Self Care | Source: Ambulatory Visit | Attending: Sports Medicine

## 2023-12-06 DIAGNOSIS — M4727 Other spondylosis with radiculopathy, lumbosacral region: Secondary | ICD-10-CM | POA: Diagnosis not present

## 2023-12-06 DIAGNOSIS — G8929 Other chronic pain: Secondary | ICD-10-CM

## 2023-12-06 DIAGNOSIS — M4316 Spondylolisthesis, lumbar region: Secondary | ICD-10-CM | POA: Diagnosis not present

## 2023-12-06 DIAGNOSIS — M51362 Other intervertebral disc degeneration, lumbar region with discogenic back pain and lower extremity pain: Secondary | ICD-10-CM

## 2023-12-06 DIAGNOSIS — M48061 Spinal stenosis, lumbar region without neurogenic claudication: Secondary | ICD-10-CM | POA: Diagnosis not present

## 2023-12-06 MED ORDER — METHYLPREDNISOLONE ACETATE 40 MG/ML INJ SUSP (RADIOLOG
80.0000 mg | Freq: Once | INTRAMUSCULAR | Status: AC
Start: 2023-12-06 — End: 2023-12-06
  Administered 2023-12-06: 80 mg via EPIDURAL

## 2023-12-06 MED ORDER — IOPAMIDOL (ISOVUE-M 200) INJECTION 41%
1.0000 mL | Freq: Once | INTRAMUSCULAR | Status: AC
Start: 2023-12-06 — End: 2023-12-06
  Administered 2023-12-06: 1 mL via EPIDURAL

## 2023-12-14 ENCOUNTER — Encounter: Payer: Self-pay | Admitting: Dermatology

## 2023-12-14 ENCOUNTER — Ambulatory Visit: Payer: Medicare Other | Admitting: Dermatology

## 2023-12-14 VITALS — BP 135/87

## 2023-12-14 DIAGNOSIS — L578 Other skin changes due to chronic exposure to nonionizing radiation: Secondary | ICD-10-CM | POA: Diagnosis not present

## 2023-12-14 DIAGNOSIS — L821 Other seborrheic keratosis: Secondary | ICD-10-CM | POA: Diagnosis not present

## 2023-12-14 DIAGNOSIS — L814 Other melanin hyperpigmentation: Secondary | ICD-10-CM

## 2023-12-14 DIAGNOSIS — D1801 Hemangioma of skin and subcutaneous tissue: Secondary | ICD-10-CM

## 2023-12-14 DIAGNOSIS — Z1283 Encounter for screening for malignant neoplasm of skin: Secondary | ICD-10-CM

## 2023-12-14 DIAGNOSIS — D229 Melanocytic nevi, unspecified: Secondary | ICD-10-CM

## 2023-12-14 DIAGNOSIS — L820 Inflamed seborrheic keratosis: Secondary | ICD-10-CM

## 2023-12-14 DIAGNOSIS — W908XXA Exposure to other nonionizing radiation, initial encounter: Secondary | ICD-10-CM | POA: Diagnosis not present

## 2023-12-14 NOTE — Progress Notes (Unsigned)
   Total Body Skin Exam (TBSE) Visit   Subjective  Peggy Santiago is a 73 y.o. female who presents for the following: Skin Cancer Screening and Full Body Skin Exam  Patient presents today for follow up visit for TBSE. Patient was last evaluated on 12/14/22 . Patient denies medication changes. Patient reports she does not have spots, moles and lesions of concern to be evaluated. Patient reports throughout her lifetime she has had moderate sun exposure. Currently, patient reports if she has excessive sun exposure, she does apply sunscreen and/or wears protective coverings. Patient reports she does not have hx of bx. Patient denies  family history of skin cancers. The patient has spots, moles and lesions to be evaluated, some may be new or changing and the patient has concerns that these could be cancer.  The following portions of the chart were reviewed this encounter and updated as appropriate: medications, allergies, medical history  Review of Systems:  No other skin or systemic complaints except as noted in HPI or Assessment and Plan.  Objective  Well appearing patient in no apparent distress; mood and affect are within normal limits.  A full examination was performed including scalp, head, eyes, ears, nose, lips, neck, chest, axillae, abdomen, back, buttocks, bilateral upper extremities, bilateral lower extremities, hands, feet, fingers, toes, fingernails, and toenails. All findings within normal limits unless otherwise noted below.   Relevant physical exam findings are noted in the Assessment and Plan.  Left Malar Cheek x1 Erythematous waxy papule  Assessment & Plan   LENTIGINES, SEBORRHEIC KERATOSES, HEMANGIOMAS - Benign normal skin lesions - Benign-appearing - Call for any changes  BENIGN MELANOCYTIC NEVI - Tan-brown and/or pink-flesh-colored symmetric macules and papules - Benign appearing on exam today - Observation - Call clinic for new or changing moles - Recommend daily  use of broad spectrum spf 30+ sunscreen to sun-exposed areas.   MILD ACTINIC DAMAGE - Chronic condition, secondary to cumulative UV/sun exposure - diffuse scaly erythematous macules with underlying dyspigmentation - Recommend daily broad spectrum sunscreen SPF 30+ to sun-exposed areas, reapply every 2 hours as needed.  - Staying in the shade or wearing long sleeves, sun glasses (UVA+UVB protection) and wide brim hats (4-inch brim around the entire circumference of the hat) are also recommended for sun protection.  - Call for new or changing lesions.   SKIN CANCER SCREENING PERFORMED TODAY.  INFLAMED SEBORRHEIC KERATOSIS Left Malar Cheek x1 Symptomatic, irritating, patient would like treated. Destruction of lesion - Left Malar Cheek x1 Complexity: simple   Destruction method: cryotherapy   Informed consent: discussed and consent obtained   Timeout:  patient name, date of birth, surgical site, and procedure verified Lesion destroyed using liquid nitrogen: Yes   Post-procedure details: wound care instructions given   Return in about 1 year (around 12/13/2024) for TBSE.   Documentation: I have reviewed the above documentation for accuracy and completeness, and I agree with the above.  I, Shirron Louanne Roussel, CMA, am acting as scribe for [employer], DO.   Louana Roup, DO

## 2023-12-14 NOTE — Patient Instructions (Addendum)
 Date: Wed Dec 14 2023  Hello Peggy Santiago,  Thank you for visiting today. Here is a summary of the key instructions:  - Wound Care: Apply Vaseline or Aquaphor to the frozen spot on your cheek every day until fully healed (2-3 weeks, likely 2 weeks)  - Skin Protection: Continue using sunscreen, especially during your [place] trip  - Follow-up: Return for next appointment in 2 years  Please reach out if you have any questions or concerns.  Warm regards,  Dr. Louana Roup, Dermatology     Cryotherapy Aftercare  Wash gently with soap and water everyday.   Apply Vaseline and Band-Aid daily until healed.      Important Information  Due to recent changes in healthcare laws, you may see results of your pathology and/or laboratory studies on MyChart before the doctors have had a chance to review them. We understand that in some cases there may be results that are confusing or concerning to you. Please understand that not all results are received at the same time and often the doctors may need to interpret multiple results in order to provide you with the best plan of care or course of treatment. Therefore, we ask that you please give us  2 business days to thoroughly review all your results before contacting the office for clarification. Should we see a critical lab result, you will be contacted sooner.   If You Need Anything After Your Visit  If you have any questions or concerns for your doctor, please call our main line at [phone] If no one answers, please leave a voicemail as directed and we will return your call as soon as possible. Messages left after 4 pm will be answered the following business day.   You may also send us  a message via MyChart. We typically respond to MyChart messages within 1-2 business days.  For prescription refills, please ask your pharmacy to contact our office. Our fax number is [phone].  If you have an urgent issue when the clinic is closed that  cannot wait until the next business day, you can page your doctor at the number below.    Please note that while we do our best to be available for urgent issues outside of office hours, we are not available 24/7.   If you have an urgent issue and are unable to reach us , you may choose to seek medical care at your doctor's office, retail clinic, urgent care center, or emergency room.  If you have a medical emergency, please immediately call 911 or go to the emergency department. In the event of inclement weather, please call our main line at [phone] for an update on the status of any delays or closures.  Dermatology Medication Tips: Please keep the boxes that topical medications come in in order to help keep track of the instructions about where and how to use these. Pharmacies typically print the medication instructions only on the boxes and not directly on the medication tubes.   If your medication is too expensive, please contact our office at [phone] or send us  a message through MyChart.   We are unable to tell what your co-pay for medications will be in advance as this is different depending on your insurance coverage. However, we may be able to find a substitute medication at lower cost or fill out paperwork to get insurance to cover a needed medication.   If a prior authorization is required to get your medication covered by your insurance company, please allow  us  1-2 business days to complete this process.  Drug prices often vary depending on where the prescription is filled and some pharmacies may offer cheaper prices.  The website www.goodrx.com contains coupons for medications through different pharmacies. The prices here do not account for what the cost may be with help from insurance (it may be cheaper with your insurance), but the website can give you the price if you did not use any insurance.  - You can print the associated coupon and take it with your prescription to  the pharmacy.  - You may also stop by our office during regular business hours and pick up a GoodRx coupon card.  - If you need your prescription sent electronically to a different pharmacy, notify our office through [facility] or by phone at [phone]

## 2023-12-19 NOTE — Progress Notes (Unsigned)
    Peggy Santiago D.[facility] [address] [place] [address] Phone: [phone]   Assessment and Plan:     There are no diagnoses linked to this encounter.  ***   Pertinent previous records reviewed include ***    Follow Up: ***     Subjective:   I, Peggy Santiago, am serving as a Neurosurgeon for Doctor Peggy Santiago  Chief Complaint: left hip pain    HPI:    11/02/2023 Patient is a 72 year old female with left hip pain. Patient states been everywhere and done everything and no once can help her (injections, medications, etc). Just finished 7 day z-pack. Try not to take strong medication. Tried CBD oinments as well. Pain does radiate down her let down to her feet. The pain is on both sides but the left is more than the right. Can not lay on left side without pain. Pain wakes her up at night. Has to move very slowly to get anywhere now but used to be able to out walk most people. Pain is located on the panty line and back of the butt cheeks.   Duration? About 3 years ago Did you have an Injury to cause this pain? no Taking Medication for pain? tramadol  Numbness or Tingling? no Does the pain Radiate? yes Altered gait or use? yes ROM/ impairment of movement? yes     11/16/2023 Patient states intermittent pain. 5 am pain has been waking her up   11/29/2023 Patient states she is the same   12/20/2023 Patient states   Relevant Historical Information: IBS, DM type II  Additional pertinent review of systems negative.   Current Outpatient Medications:    celecoxib  (CELEBREX ) 200 MG capsule, Take 1 capsule (200 mg total) by mouth 2 (two) times daily as needed., Disp: , Rfl:    Co-Enzyme Q10 100 MG CAPS, Take 100 mg by mouth daily., Disp: , Rfl:    cyanocobalamin  (VITAMIN B12) 1000 MCG/ML injection, Inject 1 mL (1,000 mcg total) into the muscle every 30 (thirty) days., Disp: 10 mL, Rfl: 0   cyclobenzaprine  (FLEXERIL ) 10 MG tablet, Take 1  tablet (10 mg total) by mouth 2 (two) times daily as needed for muscle spasms., Disp: 15 tablet, Rfl: 0   ezetimibe  (ZETIA ) 10 MG tablet, Take 1 tablet (10 mg total) by mouth daily., Disp: 60 tablet, Rfl: 0   Multiple Vitamin (MULTIVITAMIN WITH MINERALS) TABS tablet, Take 1 tablet by mouth daily., Disp: , Rfl:    omeprazole  (PRILOSEC) 40 MG capsule, Take 1 capsule (40 mg total) by mouth daily., Disp: 90 capsule, Rfl: 3   predniSONE  (DELTASONE ) 50 MG tablet, Pt to take 50 mg of prednisone  on 12/05/23 at 7:30PM, 50 mg of prednisone  on 12/06/23 at 1:30AM, and 50 mg of prednisone  on 12/06/23 at 7:30AM. Pt is also to take 50 mg of benadryl  on 12/06/23 at 7:30AM. Please call [phone] with any questions., Disp: 3 tablet, Rfl: 0   rosuvastatin  (CRESTOR ) 5 MG tablet, Take 1 tablet (5 mg total) by mouth every other day., Disp: , Rfl:    Objective:     There were no vitals filed for this visit.    There is no height or weight on file to calculate BMI.    Physical Exam:    ***   Electronically signed by:  Peggy Santiago D.[facility] 7:37 AM 12/19/23

## 2023-12-20 ENCOUNTER — Ambulatory Visit (INDEPENDENT_AMBULATORY_CARE_PROVIDER_SITE_OTHER): Admitting: Sports Medicine

## 2023-12-20 VITALS — HR 76 | Ht 62.0 in | Wt 183.0 lb

## 2023-12-20 DIAGNOSIS — G8929 Other chronic pain: Secondary | ICD-10-CM | POA: Diagnosis not present

## 2023-12-20 DIAGNOSIS — M51362 Other intervertebral disc degeneration, lumbar region with discogenic back pain and lower extremity pain: Secondary | ICD-10-CM

## 2023-12-20 NOTE — Patient Instructions (Addendum)
 Repeat epidural left sided L4-L5 Continue tylenol  for day to day pain relief. Continue home exercises.  Follow up in 3 to 4 weeks.

## 2023-12-21 ENCOUNTER — Telehealth: Payer: Self-pay

## 2023-12-21 MED ORDER — PREDNISONE 50 MG PO TABS: ORAL_TABLET | ORAL | 0 refills | Status: DC

## 2023-12-21 NOTE — Telephone Encounter (Signed)
 Phone call to patient to review instructions for 13 hr prep for Epi Injection w/ CT contrast on 12/23/23  at 11:30AM. Prescription called into [facility]. Pt aware and verbalized understanding of instructions. Prescription:  Pt to take 50 mg of prednisone  on 12/22/23 at 10:30PM, 50 mg of prednisone  on 12/23/23 at 4:30AM, and 50 mg of prednisone  on 12/23/23 at 10:30AM. Pt is also to take 50 mg of benadryl  on 12/23/23 at 10:30AM. Please call [phone] with any questions.

## 2023-12-21 NOTE — Discharge Instructions (Signed)

## 2023-12-23 ENCOUNTER — Ambulatory Visit
Admission: RE | Admit: 2023-12-23 | Discharge: 2023-12-23 | Disposition: A | Discharge status: Home / Self Care | Source: Ambulatory Visit | Attending: Sports Medicine

## 2023-12-23 DIAGNOSIS — G8929 Other chronic pain: Secondary | ICD-10-CM

## 2023-12-23 DIAGNOSIS — M4727 Other spondylosis with radiculopathy, lumbosacral region: Secondary | ICD-10-CM | POA: Diagnosis not present

## 2023-12-23 DIAGNOSIS — M51362 Other intervertebral disc degeneration, lumbar region with discogenic back pain and lower extremity pain: Secondary | ICD-10-CM

## 2023-12-23 DIAGNOSIS — M48061 Spinal stenosis, lumbar region without neurogenic claudication: Secondary | ICD-10-CM | POA: Diagnosis not present

## 2023-12-23 MED ORDER — METHYLPREDNISOLONE ACETATE 40 MG/ML INJ SUSP (RADIOLOG
80.0000 mg | Freq: Once | INTRAMUSCULAR | Status: AC
Start: 2023-12-23 — End: 2023-12-23
  Administered 2023-12-23: 80 mg via EPIDURAL

## 2023-12-23 MED ORDER — IOPAMIDOL (ISOVUE-M 200) INJECTION 41%
1.0000 mL | Freq: Once | INTRAMUSCULAR | Status: AC
  Administered 2023-12-23: 1 mL via EPIDURAL

## 2023-12-28 ENCOUNTER — Ambulatory Visit: Admitting: Pharmacist

## 2023-12-28 ENCOUNTER — Other Ambulatory Visit (INDEPENDENT_AMBULATORY_CARE_PROVIDER_SITE_OTHER)

## 2023-12-28 DIAGNOSIS — M791 Myalgia, unspecified site: Secondary | ICD-10-CM

## 2023-12-28 DIAGNOSIS — E785 Hyperlipidemia, unspecified: Secondary | ICD-10-CM | POA: Diagnosis not present

## 2023-12-28 DIAGNOSIS — T466X5A Adverse effect of antihyperlipidemic and antiarteriosclerotic drugs, initial encounter: Secondary | ICD-10-CM

## 2023-12-28 LAB — LIPID PANEL
Cholesterol: 275 mg/dL — ABNORMAL HIGH (ref 0–200)
HDL: 43.7 mg/dL (ref >39.00)
LDL Cholesterol: 174 mg/dL — ABNORMAL HIGH (ref 0–99)
NonHDL: 231.05
Total CHOL/HDL Ratio: 6
Triglycerides: 285 mg/dL — ABNORMAL HIGH (ref 0.0–149.0)
VLDL: 57 mg/dL — ABNORMAL HIGH (ref 0.0–40.0)

## 2023-12-28 NOTE — Progress Notes (Signed)
 12/28/2023 Name: TAKASHA Santiago MRN: [id number] DOB: [birth year]  Chief Complaint  Patient presents with   Hyperlipidemia    Peggy Santiago is a 73 y.o. year old female who presented for a telephone visit.   They were referred to the pharmacist by their PCP for assistance in managing diabetes and hyperlipidemia.    Subjective:  Medication Access/Adherence  Current Pharmacy:  [facility] [address], [place] - [address] Haus[address] [place] [address] Phone: [phone] Fax: [phone]   Patient reports affordability concerns with their medications: No  Patient reports access/transportation concerns to their pharmacy: No  Patient reports adherence concerns with their medications:  Yes     Hyperlipidemia/ASCVD Risk Reduction  Current lipid lowering medications: rosuvastatin  5mg  every other day and ezetimibe  10mg  daily but patient reports today that she stopped both rosuvastatin  and ezetimibe  due to myalgis / muscle pain.   She has been seeing orthopedic team for hip and back pain. She has received 2 epidural injections and pain is improving.   Medications tried in the past: atorvastatin , pravastatin , simvastatin  - call caused myalgias  Dr Geralyn Knee has discussed starting PCSK9 with patient in the past but cost has been an issue. Today patient states she would prefer not to take an injection.   Co-Morbidities = type 2 DM  Patient also mentions today that she has had pancreatitis in the past. It was several years ago - she cannot remember when. Nothing in her chart noting this so likely was before 2012. She denies alcohol use now and also at time when she was diagnosed with pancreatitis.   CT Cardiac Scoring 09/06/2022  IMPRESSION: 1. Coronary calcium  score of 7.3. This was 45 percentile for age-, race-, and sex-matched controls.   2.  Aortic atherosclerosis.   RECOMMENDATIONS: Coronary artery calcium  (CAC) score is a strong predictor  of incident coronary heart disease (CHD) and provides predictive information beyond traditional risk factors. CAC scoring is reasonable to use in the decision to withhold, postpone, or initiate statin therapy in intermediate-risk or selected borderline-risk asymptomatic adults (age 36-75 years and LDL-C >=70 to <190 mg/dL) who do not have diabetes or established atherosclerotic cardiovascular disease (ASCVD).* In intermediate-risk (10-year ASCVD risk >=7.5% to <20%) adults or selected borderline-risk (10-year ASCVD risk >=5% to <7.5%) adults in whom a CAC score is measured for the purpose of making a treatment decision the following recommendations have been made:   If CAC=0, it is reasonable to withhold statin therapy and reassess in 5 to 10 years, as long as higher risk conditions are absent (diabetes mellitus, family history of premature CHD in first degree relatives (males <55 years; females <65 years), cigarette smoking, or LDL >=190 mg/dL).   If CAC is 1 to 99, it is reasonable to initiate statin therapy for patients >=72 years of age.  Objective:  Lab Results  Component Value Date   HGBA1C 6.6 (H) 09/12/2023    Lab Results  Component Value Date   CREATININE 0.88 09/12/2023   BUN 22 09/12/2023   NA 141 09/12/2023   K 4.3 09/12/2023   CL 107 09/12/2023   CO2 27 09/12/2023    Lab Results  Component Value Date   CHOL 204 (H) 09/12/2023   HDL 35.50 (L) 09/12/2023   LDLCALC 120 (H) 09/12/2023   LDLDIRECT 161.0 08/25/2022   TRIG 244.0 (H) 09/12/2023   CHOLHDL 6 09/12/2023    Medications Reviewed Today     Reviewed by Alida Ion,  Alease Fait, RPH-CPP (Pharmacist) on 12/28/23 at 0954  Med List Status: <None>   Medication Order Taking? Sig Documenting Provider Last Dose Status Informant  acetaminophen  (TYLENOL ) 500 MG tablet [id number] Yes Take 500 mg by mouth every 8 (eight) hours as needed. [provider]  Active     Discontinued 12/28/23 0737 (Patient Preference)    Co-Enzyme Q10 100 MG CAPS [id number]  Take 100 mg by mouth daily.  Patient not taking: Reported on 12/28/2023   [provider]  Active   cyanocobalamin  (VITAMIN B12) 1000 MCG/ML injection [id number] Yes Inject 1 mL (1,000 mcg total) into the muscle every 30 (thirty) days. Copland, Skipper Dumas, MD  Active   cyclobenzaprine  (FLEXERIL ) 10 MG tablet [id number] Yes Take 1 tablet (10 mg total) by mouth 2 (two) times daily as needed for muscle spasms. Copland, Skipper Dumas, MD  Active   ezetimibe  (ZETIA ) 10 MG tablet [id number]  Take 1 tablet (10 mg total) by mouth daily.  Patient not taking: Reported on 12/28/2023   Copland, Jessica C, MD  Active   Multiple Vitamin (MULTIVITAMIN WITH MINERALS) TABS tablet [id number] Yes Take 1 tablet by mouth daily. [provider]  Active Self  omeprazole  (PRILOSEC) 40 MG capsule [id number]  Take 1 capsule (40 mg total) by mouth daily. Copland, Skipper Dumas, MD  Active   predniSONE  (DELTASONE ) 50 MG tablet [id number]  Pt to take 50 mg of prednisone  on 12/05/23 at 7:30PM, 50 mg of prednisone  on 12/06/23 at 1:30AM, and 50 mg of prednisone  on 12/06/23 at 7:30AM. Pt is also to take 50 mg of benadryl  on 12/06/23 at 7:30AM. Please call [phone] with any questions. Audree Leas, MD  Active   predniSONE  (DELTASONE ) 50 MG tablet [id number]  Pt to take 50 mg of prednisone  on 12/22/23 at 10:30PM, 50 mg of prednisone  on 12/23/23 at 4:30AM, and 50 mg of prednisone  on 12/23/23 at 10:30AM. Pt is also to take 50 mg of benadryl  on 12/23/23 at 10:30AM. Please call [phone] with any questions. Aundra Lee, MD  Active     Discontinued 12/28/23 [id number] (Side effect (s))            Med Note>> Cecilie Coffee, RPH-CPP   12/28/2023  9:26 AM myalgias                Assessment/Plan:   Hyperlipidemia/ASCVD Risk Reduction: LDL and Tg not at goals (LDL < 100 and Tg < 150) though LDL has decreased significantly over the last year. Patient has not tolerated statins in the past.  -  Reviewed long term complications of uncontrolled cholesterol - Checking lipids today. - Restart ezetimibe  10mg  every other day, if not pain after 1 to 2 weeks, consider increasing to every other day.  - Recommended she start CoEnzyme Q10 100mg  daily (already on her med list) - Discussed pros and cons of PCSK9 agents like Repatha . Noted to patient that side effects are rare with these agents and reassured her that risk of myalgias is also low.  - Reviewed her [employer] Medicare Part D plan. Patient has $573 deductible and then Repatha  or Praulent copay would be 20% of medication cost or around $125 / month. I think she might qualify for [employer] that would likely cover any remaining cost and her deductible.  - Discussed possibly starting Ozempic for type 2 DM, weight loss and cardiac benefits but since she mentions past history of pancreatitis there is a risk of recurrence with GLP1 agents.    Orders Placed This  Encounter  Procedures   Lipid panel    CC Results:   Energy, Geordie Nooney [[id number]]    Cecilie Coffee, PharmD Clinical Pharmacist  [facility]

## 2023-12-30 ENCOUNTER — Encounter: Payer: Self-pay | Admitting: Family Medicine

## 2024-01-02 ENCOUNTER — Ambulatory Visit: Payer: Self-pay | Admitting: Pharmacist

## 2024-01-02 NOTE — Telephone Encounter (Signed)
 Spoke with patient about her lipid results. She has restarted ezetimibe  10mg  and is taking every day.  She also would like to start Ozempic 0.25mg  weekly - provided a sample.  IF she has any abdominal pain or nausea she is to contact office. She will follow up with Dr Watt the first week of August 2025.

## 2024-01-11 ENCOUNTER — Other Ambulatory Visit: Payer: Self-pay | Admitting: Family Medicine

## 2024-01-11 DIAGNOSIS — D510 Vitamin B12 deficiency anemia due to intrinsic factor deficiency: Secondary | ICD-10-CM

## 2024-01-12 NOTE — Progress Notes (Signed)
 Peggy Santiago Peggy Santiago [facility] [address] [place] [address] Phone: [phone]   Assessment and Plan:     1. Chronic bilateral low back pain with left-sided sciatica 2. Degeneration of intervertebral disc of lumbar region with discogenic back pain and lower extremity pain 3. Left hip pain  -Chronic with exacerbation, subsequent visit - Patient originally had significant, 80%, improvement in pain and radicular symptoms after epidural CSI to left-sided L4-L5 performed on 12/06/2023.We elected to repeat epidural CSI at left-sided L4-L5 on 12/23/2023, and unfortunately patient has had no improvement in symptoms since that injection, and overall symptoms have slightly worsened including low back pain and left lateral hip pain - Start prednisone  Dosepak which has been beneficial for patient in the past - Use Celebrex  200 mg daily as needed for pain.  Recommend limiting chronic NSAIDs to 1-2 doses per week to prevent long-term side effects. - Use Tylenol  500 to 1000 mg tablets 2-3 times a day for day-to-day pain relief   Pertinent previous records reviewed include none  Follow Up: 2 to 3 weeks after returning from cruise.  Patient has had thorough workup and multiple injections to left hip that provided no relief in the past.  Patient's epidural CSI was the first treatment that provided significant relief in symptoms and patient does have disc bulge and facet arthrosis at L4-L5 creating moderate spinal canal stenosis and foraminal stenosis worse on the left.  At follow-up, could repeat physical exam to determine if lumbar etiology is still patient's primary etiology, or if there is additional hip pathology such as greater trochanteric bursitis present.  Could consider repeat epidural versus neurosurgery referral versus CSI   Subjective:   I, Claretha Schimke am a scribe for Dr. Leonce.    Chief Complaint: left hip pain    HPI:    11/02/2023 Patient is a  73 year old female with left hip pain. Patient states been everywhere and done everything and no once can help her (injections, medications, etc). Just finished 7 day z-pack. Try not to take strong medication. Tried CBD oinments as well. Pain does radiate down her let down to her feet. The pain is on both sides but the left is more than the right. Can not lay on left side without pain. Pain wakes her up at night. Has to move very slowly to get anywhere now but used to be able to out walk most people. Pain is located on the panty line and back of the butt cheeks.   Duration? About 3 years ago Did you have an Injury to cause this pain? no Taking Medication for pain? tramadol  Numbness or Tingling? no Does the pain Radiate? yes Altered gait or use? yes ROM/ impairment of movement? yes     11/16/2023 Patient states intermittent pain. 5 am pain has been waking her up   11/29/2023 Patient states she is the same    12/20/2023 Patient states she is feeling a little better   01/16/2024 Patient states feeling pretty good. Still having pain but no flare ups over the weekend.    Relevant Historical Information: IBS, DM type II  Additional pertinent review of systems negative.   Current Outpatient Medications:    methylPREDNISolone  (MEDROL  DOSEPAK) 4 MG TBPK tablet, Follow instructions on package., Disp: 21 tablet, Rfl: 0   acetaminophen  (TYLENOL ) 500 MG tablet, Take 500 mg by mouth every 8 (eight) hours as needed., Disp: , Rfl:    Co-Enzyme Q10 100 MG CAPS,  Take 100 mg by mouth daily. (Patient not taking: Reported on 12/28/2023), Disp: , Rfl:    cyanocobalamin  (VITAMIN B12) 1000 MCG/ML injection, Inject 1 mL (1,000 mcg total) into the muscle every 30 (thirty) days., Disp: 3 mL, Rfl: 1   cyclobenzaprine  (FLEXERIL ) 10 MG tablet, Take 1 tablet (10 mg total) by mouth 2 (two) times daily as needed for muscle spasms., Disp: 15 tablet, Rfl: 0   ezetimibe  (ZETIA ) 10 MG tablet, Take 1 tablet (10 mg total) by  mouth daily. (Patient not taking: Reported on 12/28/2023), Disp: 60 tablet, Rfl: 0   Multiple Vitamin (MULTIVITAMIN WITH MINERALS) TABS tablet, Take 1 tablet by mouth daily., Disp: , Rfl:    omeprazole  (PRILOSEC) 40 MG capsule, Take 1 capsule (40 mg total) by mouth daily., Disp: 90 capsule, Rfl: 3   predniSONE  (DELTASONE ) 50 MG tablet, Pt to take 50 mg of prednisone  on 12/05/23 at 7:30PM, 50 mg of prednisone  on 12/06/23 at 1:30AM, and 50 mg of prednisone  on 12/06/23 at 7:30AM. Pt is also to take 50 mg of benadryl  on 12/06/23 at 7:30AM. Please call [phone] with any questions., Disp: 3 tablet, Rfl: 0   predniSONE  (DELTASONE ) 50 MG tablet, Pt to take 50 mg of prednisone  on 12/22/23 at 10:30PM, 50 mg of prednisone  on 12/23/23 at 4:30AM, and 50 mg of prednisone  on 12/23/23 at 10:30AM. Pt is also to take 50 mg of benadryl  on 12/23/23 at 10:30AM. Please call [phone] with any questions., Disp: 3 tablet, Rfl: 0   Objective:     Vitals:   01/16/24 0902  BP: 110/70  Pulse: 75  SpO2: 98%  Weight: 178 lb 6.4 oz (80.9 kg)  Height: 5' 2 (1.575 m)      Body mass index is 32.63 kg/m.    Physical Exam:    Gen: Appears well, nad, nontoxic and pleasant Psych: Alert and oriented, appropriate mood and affect Neuro: sensation intact, strength is 5/5 in upper and lower extremities, muscle tone wnl Skin: no susupicious lesions or rashes   Back - Normal skin, Spine with normal alignment and no deformity.   No tenderness to vertebral process palpation.   Bilateral paraspinous muscles are mildly tender and without spasm  TTP gluteal musculature, greater trochanter Straight leg raise positive left, negative right Trendelenberg negative Piriformis Test negative for radicular pain, though reproduced tightness on left not present on right Gait normal     Electronically signed by:  Odis Mace [facility] 9:26 AM 01/16/24

## 2024-01-16 ENCOUNTER — Ambulatory Visit (INDEPENDENT_AMBULATORY_CARE_PROVIDER_SITE_OTHER): Admitting: Sports Medicine

## 2024-01-16 VITALS — BP 110/70 | HR 75 | Ht 62.0 in | Wt 178.4 lb

## 2024-01-16 DIAGNOSIS — G8929 Other chronic pain: Secondary | ICD-10-CM

## 2024-01-16 DIAGNOSIS — M25552 Pain in left hip: Secondary | ICD-10-CM | POA: Diagnosis not present

## 2024-01-16 DIAGNOSIS — M51362 Other intervertebral disc degeneration, lumbar region with discogenic back pain and lower extremity pain: Secondary | ICD-10-CM | POA: Diagnosis not present

## 2024-01-16 MED ORDER — CELECOXIB 200 MG PO CAPS: 200.0000 mg | ORAL_CAPSULE | ORAL | 0 refills | Status: DC | PRN

## 2024-01-16 MED ORDER — METHYLPREDNISOLONE 4 MG PO TBPK: ORAL_TABLET | ORAL | 0 refills | Status: DC

## 2024-01-16 NOTE — Patient Instructions (Signed)
 Prednisone  dose pack - Use celebrex  200 mg daily as needed for pain.  Recommend limiting chronic NSAIDs to 1-2 doses per week to prevent long-term side effects. - Use Tylenol  500 to 1000 mg tablets 2-3 times a day for day-to-day pain relief Follow up in 3 weeks.

## 2024-01-20 ENCOUNTER — Telehealth: Payer: Self-pay | Admitting: Pharmacist

## 2024-01-20 NOTE — Progress Notes (Signed)
 Tried to contact patient to follow up new Ozempic start 2 or 3 weeks ago. Unable to reach patient. LM on VM with my contact number [phone] or she can call main office number [phone]

## 2024-02-07 ENCOUNTER — Ambulatory Visit: Admitting: Sports Medicine

## 2024-02-07 NOTE — Progress Notes (Unsigned)
 Ben Jackson D.CLEMENTEEN [facility] [address] [place] [address] Phone: [phone]   Assessment and Plan:     There are no diagnoses linked to this encounter.  ***   Pertinent previous records reviewed include ***    Follow Up: ***     Subjective:   I, Jarell Mcewen, am serving as a Neurosurgeon for Doctor Morene Mace  Chief Complaint: left hip pain    HPI:    11/02/2023 Patient is a 73 year old female with left hip pain. Patient states been everywhere and done everything and no once can help her (injections, medications, etc). Just finished 7 day z-pack. Try not to take strong medication. Tried CBD oinments as well. Pain does radiate down her let down to her feet. The pain is on both sides but the left is more than the right. Can not lay on left side without pain. Pain wakes her up at night. Has to move very slowly to get anywhere now but used to be able to out walk most people. Pain is located on the panty line and back of the butt cheeks.   Duration? About 3 years ago Did you have an Injury to cause this pain? no Taking Medication for pain? tramadol  Numbness or Tingling? no Does the pain Radiate? yes Altered gait or use? yes ROM/ impairment of movement? yes     11/16/2023 Patient states intermittent pain. 5 am pain has been waking her up   11/29/2023 Patient states she is the same    12/20/2023 Patient states she is feeling a little better    01/16/2024 Patient states feeling pretty good. Still having pain but no flare ups over the weekend.   02/08/2024 Patient states   Relevant Historical Information: IBS, DM type II  Additional pertinent review of systems negative.   Current Outpatient Medications:    acetaminophen  (TYLENOL ) 500 MG tablet, Take 500 mg by mouth every 8 (eight) hours as needed., Disp: , Rfl:    Co-Enzyme Q10 100 MG CAPS, Take 100 mg by mouth daily. (Patient not taking: Reported on 12/28/2023), Disp: , Rfl:     cyanocobalamin  (VITAMIN B12) 1000 MCG/ML injection, Inject 1 mL (1,000 mcg total) into the muscle every 30 (thirty) days., Disp: 3 mL, Rfl: 1   cyclobenzaprine  (FLEXERIL ) 10 MG tablet, Take 1 tablet (10 mg total) by mouth 2 (two) times daily as needed for muscle spasms., Disp: 15 tablet, Rfl: 0   ezetimibe  (ZETIA ) 10 MG tablet, Take 1 tablet (10 mg total) by mouth daily. (Patient not taking: Reported on 12/28/2023), Disp: 60 tablet, Rfl: 0   methylPREDNISolone  (MEDROL  DOSEPAK) 4 MG TBPK tablet, Follow instructions on package., Disp: 21 tablet, Rfl: 0   Multiple Vitamin (MULTIVITAMIN WITH MINERALS) TABS tablet, Take 1 tablet by mouth daily., Disp: , Rfl:    omeprazole  (PRILOSEC) 40 MG capsule, Take 1 capsule (40 mg total) by mouth daily., Disp: 90 capsule, Rfl: 3   predniSONE  (DELTASONE ) 50 MG tablet, Pt to take 50 mg of prednisone  on 12/05/23 at 7:30PM, 50 mg of prednisone  on 12/06/23 at 1:30AM, and 50 mg of prednisone  on 12/06/23 at 7:30AM. Pt is also to take 50 mg of benadryl  on 12/06/23 at 7:30AM. Please call [phone] with any questions., Disp: 3 tablet, Rfl: 0   predniSONE  (DELTASONE ) 50 MG tablet, Pt to take 50 mg of prednisone  on 12/22/23 at 10:30PM, 50 mg of prednisone  on 12/23/23 at 4:30AM, and 50 mg of prednisone  on 12/23/23  at 10:30AM. Pt is also to take 50 mg of benadryl  on 12/23/23 at 10:30AM. Please call [phone] with any questions., Disp: 3 tablet, Rfl: 0   Objective:     There were no vitals filed for this visit.    There is no height or weight on file to calculate BMI.    Physical Exam:    ***   Electronically signed by:  Odis Mace D.[facility] 7:40 AM 02/07/24

## 2024-02-08 ENCOUNTER — Encounter: Payer: Self-pay | Admitting: Family Medicine

## 2024-02-08 ENCOUNTER — Ambulatory Visit (INDEPENDENT_AMBULATORY_CARE_PROVIDER_SITE_OTHER): Admitting: Sports Medicine

## 2024-02-08 VITALS — BP 124/70 | HR 83 | Ht 62.0 in

## 2024-02-08 DIAGNOSIS — M51362 Other intervertebral disc degeneration, lumbar region with discogenic back pain and lower extremity pain: Secondary | ICD-10-CM

## 2024-02-08 DIAGNOSIS — M25552 Pain in left hip: Secondary | ICD-10-CM | POA: Diagnosis not present

## 2024-02-08 DIAGNOSIS — M7062 Trochanteric bursitis, left hip: Secondary | ICD-10-CM

## 2024-02-08 DIAGNOSIS — G8929 Other chronic pain: Secondary | ICD-10-CM

## 2024-02-08 MED ORDER — PREDNISONE 50 MG PO TABS: ORAL_TABLET | ORAL | 0 refills | Status: DC

## 2024-02-08 MED ORDER — METHYLPREDNISOLONE 4 MG PO TBPK: ORAL_TABLET | ORAL | 0 refills | Status: DC

## 2024-02-08 NOTE — Patient Instructions (Signed)
 Use prednisone  as needed.  Continue Celebrex  as needed. Follow up in 4 weeks.

## 2024-02-08 NOTE — Progress Notes (Signed)
 [facility] [address], [address] [place], [place] [address] [phone]  Date:  02/13/2024   Name:  Peggy Santiago   DOB:  [birth year]   MRN:  [id number]  PCP:  Watt Harlene BROCKS, MD    Chief Complaint: Follow-up (No concerns )   History of Present Illness:  Peggy Santiago is a 73 y.o. very pleasant female patient who presents with the following:  Patient seen today for diabetes, lipid follow-up Most recent visit with myself was in March History of osteopenia, dyslipidemia, mild diabetes, migraine headache, cervical spine disease, B12 deficiency/ pernicious anemia on B12 injections   She has had difficulty tolerating statins; we have discussed a PCSK9 but so far this has been too expensive for her.  She is taking Zetia  and we recently added a GLP-1- ??  Not on medication list.  Madelin Purple, our clinical pharmacist have been assisting her.  Per notes she tried to reach out a couple of weeks ago about Ozempic but patient has not replied as of yet  Full labs on chart from March, she had a repeat lipid panel in June She is using just the 0.25 mg ozempic- tolerating pretty well and she is losing weight Constipation is managed with OTC meds She is on her 4th shot now  Wt Readings from Last 3 Encounters:  02/13/24 173 lb 6.4 oz (78.7 kg)  01/16/24 178 lb 6.4 oz (80.9 kg)  12/20/23 183 lb (83 kg)  Pt notes her appetite is much decreased- she would like to stick with 0.25 mg at this time  She did have some dental issues and had to stick with soft foods recently which made it harder to eat healthy  She is limited in her exercise by hip issues but is working with ortho   Lab Results  Component Value Date   HGBA1C 6.6 (H) 09/12/2023     Patient Active Problem List   Diagnosis Date Noted   Atypical chest pain 06/26/2021   Flank pain 06/26/2021   Osteopenia 07/02/2017   Dyslipidemia 06/15/2016   Pernicious anemia  06/10/2015   Allergic rhinitis 06/10/2015   IBS (irritable bowel syndrome) 06/10/2015   Nephrolithiasis 06/10/2015   Migraine 06/10/2015   Cervical spondylosis without myelopathy 06/10/2015   DDD (degenerative disc disease), cervical 06/10/2015   S/P total knee arthroplasty 03/25/2014   Arthritis of knee, degenerative 02/26/2014   Controlled diabetes mellitus type 2 with complications (HCC) 02/23/2014   Carpal tunnel syndrome 02/02/2013    Past Medical History:  Diagnosis Date   Allergy    Anemia    Chronic kidney disease    kidney stones   Diabetes mellitus without complication (HCC)    borderline   GERD (gastroesophageal reflux disease)    Osteoarthritis of knee    Pancreatitis, acute    patient unsure of date - was prior to   PONV (postoperative nausea and vomiting)     Past Surgical History:  Procedure Laterality Date   ABDOMINAL HYSTERECTOMY     Partial; one ovary remains   ANKLE SURGERY     with plate   APPENDECTOMY     CESAREAN SECTION     CHOLECYSTECTOMY     colon resection due to SBO     EYE SURGERY     FRACTURE SURGERY     JOINT REPLACEMENT     2 scopes rt knee   SMALL INTESTINE SURGERY  TOTAL KNEE ARTHROPLASTY Right 03/25/2014   Procedure: TOTAL KNEE ARTHROPLASTY;  Surgeon: Marcey Raman, MD;  Location: [facility] OR;  Service: Orthopedics;  Laterality: Right;    Social History   Tobacco Use   Smoking status: Never   Smokeless tobacco: Never  Vaping Use   Vaping status: Never Used  Substance Use Topics   Alcohol use: No    Alcohol/week: 0.0 standard drinks of alcohol   Drug use: No    Family History  Problem Relation Age of Onset   Heart disease Mother    Rheum arthritis Mother    Alzheimer's disease Mother    Heart disease Father    Diabetes Sister    Heart disease Sister    Hyperlipidemia Sister    Cancer Brother    Heart disease Brother    Hyperlipidemia Brother    Hyperlipidemia Brother    Hyperlipidemia Sister    Heart disease Sister     Diabetes Sister     Allergies  Allergen Reactions   Statins     Body aches, has tried several times and does not tolerate well   Contrast Media [Iodinated Contrast Media] Hives, Rash and Other (See Comments)    ALL OVER THE BODY   Ioxaglate Rash and Hives    ALL OVER THE BODY   Rosuvastatin  Other (See Comments)    Severe myalgias    Medication list has been reviewed and updated.  Current Outpatient Medications on File Prior to Visit  Medication Sig Dispense Refill   acetaminophen  (TYLENOL ) 500 MG tablet Take 500 mg by mouth every 8 (eight) hours as needed.     Co-Enzyme Q10 100 MG CAPS Take 100 mg by mouth daily.     cyanocobalamin  (VITAMIN B12) 1000 MCG/ML injection Inject 1 mL (1,000 mcg total) into the muscle every 30 (thirty) days. 3 mL 1   cyclobenzaprine  (FLEXERIL ) 10 MG tablet Take 1 tablet (10 mg total) by mouth 2 (two) times daily as needed for muscle spasms. 15 tablet 0   ezetimibe  (ZETIA ) 10 MG tablet Take 1 tablet (10 mg total) by mouth daily. 60 tablet 0   methylPREDNISolone  (MEDROL  DOSEPAK) 4 MG TBPK tablet Follow instructions on package. 21 tablet 0   Multiple Vitamin (MULTIVITAMIN WITH MINERALS) TABS tablet Take 1 tablet by mouth daily.     omeprazole  (PRILOSEC) 40 MG capsule Take 1 capsule (40 mg total) by mouth daily. 90 capsule 3   No current facility-administered medications on file prior to visit.    Review of Systems:  As per HPI- otherwise negative.   Physical Examination: Vitals:   02/13/24 1040  BP: 124/72  Pulse: 67  SpO2: 99%   Vitals:   02/13/24 1040  Weight: 173 lb 6.4 oz (78.7 kg)  Height: 5' 2 (1.575 m)   Body mass index is 31.72 kg/m. Ideal Body Weight: Weight in (lb) to have BMI = 25: 136.4  GEN: no acute distress. Mildly obese, has lost weight  HEENT: Atraumatic, Normocephalic.  Ears and Nose: No external deformity. CV: RRR, No M/G/R. No JVD. No thrill. No extra heart sounds. PULM: CTA B, no wheezes, crackles, rhonchi. No  retractions. No resp. distress. No accessory muscle use. ABD: S, NT, ND, +BS. No rebound. No HSM. EXTR: No c/c/e PSYCH: Normally interactive. Conversant.    Assessment and Plan: Controlled type 2 diabetes mellitus with complication, without long-term current use of insulin (HCC) - Plan: Hemoglobin A1c, Basic metabolic panel with GFR  Dyslipidemia - Plan: Lipid panel  Pernicious anemia Seen today for follow-up Doing well on ozempic 0.25 mg ; will continue this dosage for now Lipids are likely also going to improve Asked her to schedule labs only in about 3 months   Signed Harlene Schroeder, MD

## 2024-02-13 ENCOUNTER — Encounter: Payer: Self-pay | Admitting: Pharmacist

## 2024-02-13 ENCOUNTER — Encounter: Payer: Self-pay | Admitting: Family Medicine

## 2024-02-13 ENCOUNTER — Ambulatory Visit: Admitting: Family Medicine

## 2024-02-13 VITALS — BP 124/72 | HR 67 | Ht 62.0 in | Wt 173.4 lb

## 2024-02-13 DIAGNOSIS — Z7985 Long-term (current) use of injectable non-insulin antidiabetic drugs: Secondary | ICD-10-CM

## 2024-02-13 DIAGNOSIS — E785 Hyperlipidemia, unspecified: Secondary | ICD-10-CM | POA: Diagnosis not present

## 2024-02-13 DIAGNOSIS — D510 Vitamin B12 deficiency anemia due to intrinsic factor deficiency: Secondary | ICD-10-CM

## 2024-02-13 DIAGNOSIS — E118 Type 2 diabetes mellitus with unspecified complications: Secondary | ICD-10-CM

## 2024-02-13 NOTE — Patient Instructions (Signed)
 Good to see you! Please schedule a lab visit only in about 3 months and we will check your A1c, lipids Great job so far!

## 2024-02-13 NOTE — Progress Notes (Signed)
 02/13/2024 Name: Peggy Santiago MRN: [id number] DOB: 1950-11-01  Chief Complaint  Patient presents with   Diabetes   Medication Management    Ozempic PAP    Peggy Santiago is a 73 y.o. year old female who was in to see her PCP today. She returned her medication assistance program application for Ozempic and she had a few questions.    They were referred to the pharmacist by their PCP for assistance in managing diabetes and hyperlipidemia.    Subjective:  Medication Access/Adherence  Current Pharmacy:  [facility] [address] [place] - [address] [place] [address] Phone: [phone] Fax: [phone]   Patient reports affordability concerns with their medications: Yes  - Ozempic cost too high Patient reports access/transportation concerns to their pharmacy: No  Patient reports adherence concerns with their medications:  Yes     Diabetes / obesity:  Current medications: Ozempic 0.25mg  weekly   Current glucose readings: not checking blood glucose at home  Started Ozempic around 01/02/2024 States she is tolerating Ozempic well. Has noticed a decrease in appetite.  Starting weight: 183lbs Current Weight: 173 lbs Total weight loss: 10 lbs  Wt Readings from Last 3 Encounters:  02/13/24 173 lb 6.4 oz (78.7 kg)  01/16/24 178 lb 6.4 oz (80.9 kg)  12/20/23 183 lb (83 kg)   Patient also mentions today that she has had pancreatitis in the past. It was several years ago - she cannot remember when. Nothing in her chart noting this so likely was before 2012. She denies alcohol use now and also at time when she was diagnosed with pancreatitis.   Patient denies hypoglycemic s/sx including no dizziness, shakiness, sweating. Patient denies hyperglycemic symptoms including no polyuria, polydipsia, polyphagia, nocturia, neuropathy, blurred vision.   Hyperlipidemia/ASCVD Risk Reduction  Current lipid lowering medications: Ezetimibe  10mg   daily    Medications tried in the past: rosuvastatin  atorvastatin , pravastatin , simvastatin  - call caused myalgias  Dr Watt has discussed starting PCSK9 with patient in the past but cost has been an issue. Today patient states she would prefer not to take an injection.  - Reviewed her 72 [employer] Medicare Part D plan. Patient has $573 deductible and then Repatha  or Praulent copay would be 20% of medication cost or around $125 / month. I think she might qualify for [employer] that would likely cover any remaining cost and her deductible.   Co-Morbidities = type 2 DM   CT Cardiac Scoring 09/06/2022  IMPRESSION: 1. Coronary calcium  score of 7.3. This was 45 percentile for age-, race-, and sex-matched controls.   2.  Aortic atherosclerosis.   RECOMMENDATIONS: Coronary artery calcium  (CAC) score is a strong predictor of incident coronary heart disease (CHD) and provides predictive information beyond traditional risk factors. CAC scoring is reasonable to use in the decision to withhold, postpone, or initiate statin therapy in intermediate-risk or selected borderline-risk asymptomatic adults (age 41-75 years and LDL-C >=70 to <190 mg/dL) who do not have diabetes or established atherosclerotic cardiovascular disease (ASCVD).* In intermediate-risk (10-year ASCVD risk >=7.5% to <20%) adults or selected borderline-risk (10-year ASCVD risk >=5% to <7.5%) adults in whom a CAC score is measured for the purpose of making a treatment decision the following recommendations have been made:   If CAC=0, it is reasonable to withhold statin therapy and reassess in 5 to 10 years, as long as higher risk conditions are absent (diabetes mellitus, family history of premature CHD in first degree relatives (  males <55 years; females <65 years), cigarette smoking, or LDL >=190 mg/dL).   If CAC is 1 to 99, it is reasonable to initiate statin therapy for patients >=55 years of  age.  Objective:  Lab Results  Component Value Date   HGBA1C 6.6 (H) 09/12/2023    Lab Results  Component Value Date   CREATININE 0.88 09/12/2023   BUN 22 09/12/2023   NA 141 09/12/2023   K 4.3 09/12/2023   CL 107 09/12/2023   CO2 27 09/12/2023    Lab Results  Component Value Date   CHOL 275 (H) 12/28/2023   HDL 43.70 12/28/2023   LDLCALC 174 (H) 12/28/2023   LDLDIRECT 161.0 08/25/2022   TRIG 285.0 (H) 12/28/2023   CHOLHDL 6 12/28/2023    Medications Reviewed Today     Reviewed by Carla Milling, RPH-CPP (Pharmacist) on 02/13/24 at 1126  Med List Status: <None>   Medication Order Taking? Sig Documenting Provider Last Dose Status Informant  acetaminophen  (TYLENOL ) 500 MG tablet [id number]  Take 500 mg by mouth every 8 (eight) hours as needed. [provider]  Active   Co-Enzyme Q10 100 MG CAPS [id number]  Take 100 mg by mouth daily. [provider]  Active   cyanocobalamin  (VITAMIN B12) 1000 MCG/ML injection [id number]  Inject 1 mL (1,000 mcg total) into the muscle every 30 (thirty) days. Copland, Harlene BROCKS, MD  Active   cyclobenzaprine  (FLEXERIL ) 10 MG tablet [id number]  Take 1 tablet (10 mg total) by mouth 2 (two) times daily as needed for muscle spasms. Copland, Harlene BROCKS, MD  Active   ezetimibe  (ZETIA ) 10 MG tablet [id number]  Take 1 tablet (10 mg total) by mouth daily. Copland, Harlene BROCKS, MD  Active   methylPREDNISolone  (MEDROL  DOSEPAK) 4 MG TBPK tablet [id number]  Follow instructions on package. Leonce Katz, DO  Active   Multiple Vitamin (MULTIVITAMIN WITH MINERALS) TABS tablet [id number]  Take 1 tablet by mouth daily. [provider]  Active Self  omeprazole  (PRILOSEC) 40 MG capsule [id number]  Take 1 capsule (40 mg total) by mouth daily. Copland, Harlene BROCKS, MD  Active               Assessment/Plan:   Type 2 DM / obesity: weight has decreased since starting Ozempic and patient has tolerate well.  - continue Ozempic 0.25mg  weekly -  Answered questions and assisted with completing Ozempic medication assistance program application.  - Forwarded to PCP to review and sign.   Hyperlipidemia/ASCVD Risk Reduction: LDL and Tg not at goals (LDL < 100 and Tg < 150) though LDL has decreased significantly over the last year. Patient has not tolerated statins in the past.  - Continue ezetimibe  10mg  every other day, if not pain after 1 to 2 weeks, consider increasing to every other day.  - Continue CoEnzyme Q10 100mg  daily (already on her med list) - Reviewed her 2 [employer] Medicare Part D plan. Patient has $573 deductible and then Repatha  or Praulent copay would be 20% of medication cost or around $125 / month. I think she might qualify for [employer] that would likely cover any remaining cost and her deductible.  - Dr Watt plans to recheck lipids in 3 months.     Milling Carla, PharmD Clinical Pharmacist [facility]

## 2024-02-13 NOTE — Addendum Note (Signed)
 Addended by: CARLA MILLING B on: 02/13/2024 11:33 AM   Modules accepted: Orders

## 2024-02-27 ENCOUNTER — Other Ambulatory Visit: Payer: Self-pay | Admitting: Family Medicine

## 2024-02-27 DIAGNOSIS — K219 Gastro-esophageal reflux disease without esophagitis: Secondary | ICD-10-CM

## 2024-03-05 NOTE — Progress Notes (Unsigned)
 Ben Jackson D[facility] [address] [place] [address] Phone: [phone]   Assessment and Plan:     1. Chronic bilateral low back pain with left-sided sciatica (Primary) 2. Degeneration of intervertebral disc of lumbar region with discogenic back pain and lower extremity pain 3. Left hip pain 4. Greater trochanteric bursitis of left hip -Chronic with exacerbation, subsequent visit - Overall significant improvement in pain after greater trochanteric CSI performed at previous office visit on 02/08/2024.  Consistent with resolving greater trochanteric bursitis - It appears that patient's pain has been multifactorial with a combination of greater trochanteric bursitis, and lumbar DDD with radicular symptoms.  In the future, if symptoms return, could consider prednisone  Dosepak, and if symptoms do not resolve after medication course, would follow-up in clinic to distinguish between lumbar versus hip pathology - Use meloxicam 15 mg or Celebrex  200 mg daily as needed for pain.  Recommend limiting chronic NSAIDs to 1-2 doses per week to prevent long-term side effects.  -Could trial new mattress which may decrease flares of pain  Pertinent previous records reviewed include none  Follow Up: As needed if no improvement or worsening of symptoms   Subjective:   I, Claretha Schimke am a scribe for Dr. Leonce.    Chief Complaint: left hip pain    HPI:    11/02/2023 Patient is a 73 year old female with left hip pain. Patient states been everywhere and done everything and no once can help her (injections, medications, etc). Just finished 7 day z-pack. Try not to take strong medication. Tried CBD oinments as well. Pain does radiate down her let down to her feet. The pain is on both sides but the left is more than the right. Can not lay on left side without pain. Pain wakes her up at night. Has to move very slowly to get anywhere now but used to be able to  out walk most people. Pain is located on the panty line and back of the butt cheeks.   Duration? About 3 years ago Did you have an Injury to cause this pain? no Taking Medication for pain? tramadol  Numbness or Tingling? no Does the pain Radiate? yes Altered gait or use? yes ROM/ impairment of movement? yes     11/16/2023 Patient states intermittent pain. 5 am pain has been waking her up   11/29/2023 Patient states she is the same    12/20/2023 Patient states she is feeling a little better    01/16/2024 Patient states feeling pretty good. Still having pain but no flare ups over the weekend.    02/08/2024 Patient states that she is doing pretty good. Some pain but no flare ups.  03/06/2024 Patient states that she is better. Did not need to take the dose pack yet.    Relevant Historical Information: IBS, DM type II  Additional pertinent review of systems negative.   Current Outpatient Medications:    acetaminophen  (TYLENOL ) 500 MG tablet, Take 500 mg by mouth every 8 (eight) hours as needed., Disp: , Rfl:    Co-Enzyme Q10 100 MG CAPS, Take 100 mg by mouth daily., Disp: , Rfl:    cyanocobalamin  (VITAMIN B12) 1000 MCG/ML injection, Inject 1 mL (1,000 mcg total) into the muscle every 30 (thirty) days., Disp: 3 mL, Rfl: 1   cyclobenzaprine  (FLEXERIL ) 10 MG tablet, Take 1 tablet (10 mg total) by mouth 2 (two) times daily as needed for muscle spasms., Disp: 15 tablet, Rfl: 0  ezetimibe  (ZETIA ) 10 MG tablet, Take 1 tablet (10 mg total) by mouth daily., Disp: 90 tablet, Rfl: 1   Multiple Vitamin (MULTIVITAMIN WITH MINERALS) TABS tablet, Take 1 tablet by mouth daily., Disp: , Rfl:    omeprazole  (PRILOSEC) 40 MG capsule, Take 1 capsule (40 mg total) by mouth daily., Disp: 90 capsule, Rfl: 1   Semaglutide,0.25 or 0.5MG /DOS, (OZEMPIC, 0.25 OR 0.5 MG/DOSE,) 2 MG/3ML SOPN, Inject 0.25 mg into the skin once a week., Disp: , Rfl:    methylPREDNISolone  (MEDROL  DOSEPAK) 4 MG TBPK tablet, Follow  instructions on package. (Patient not taking: Reported on 03/06/2024), Disp: 21 tablet, Rfl: 0   Objective:     Vitals:   03/06/24 1029  BP: 122/70  Pulse: 66  SpO2: 96%  Weight: 168 lb (76.2 kg)  Height: 5' 2 (1.575 m)      Body mass index is 30.73 kg/m.    Physical Exam:    Gen: Appears well, nad, nontoxic and pleasant Psych: Alert and oriented, appropriate mood and affect Neuro: sensation intact, strength is 5/5 in upper and lower extremities, muscle tone wnl Skin: no susupicious lesions or rashes   Back - Normal skin, Spine with normal alignment and no deformity.   No tenderness to vertebral process palpation.   Bilateral paraspinous muscles are none tender and without spasm  TTP minimal gluteal musculature, minimal greater trochanter Straight leg raise negative left, negative right Trendelenberg negative Piriformis Test negative for radicular pain, though reproduced tightness on left not present on right Gait normal     Electronically signed by:  Odis Mace [facility] 11:03 AM 03/06/24

## 2024-03-06 ENCOUNTER — Telehealth: Payer: Self-pay

## 2024-03-06 ENCOUNTER — Ambulatory Visit (INDEPENDENT_AMBULATORY_CARE_PROVIDER_SITE_OTHER): Admitting: Sports Medicine

## 2024-03-06 VITALS — BP 122/70 | HR 66 | Ht 62.0 in | Wt 168.0 lb

## 2024-03-06 DIAGNOSIS — G8929 Other chronic pain: Secondary | ICD-10-CM

## 2024-03-06 DIAGNOSIS — M25552 Pain in left hip: Secondary | ICD-10-CM | POA: Diagnosis not present

## 2024-03-06 DIAGNOSIS — M51362 Other intervertebral disc degeneration, lumbar region with discogenic back pain and lower extremity pain: Secondary | ICD-10-CM

## 2024-03-06 DIAGNOSIS — M7062 Trochanteric bursitis, left hip: Secondary | ICD-10-CM

## 2024-03-06 NOTE — Patient Instructions (Signed)
 As needed follow up  Continue HEP

## 2024-03-06 NOTE — Telephone Encounter (Signed)
 Called pt and left a VM to inform pt we have Ozempic here at the office ready for pick up.

## 2024-03-07 ENCOUNTER — Ambulatory Visit: Admitting: Sports Medicine

## 2024-03-19 ENCOUNTER — Encounter: Payer: Self-pay | Admitting: Family Medicine

## 2024-03-19 DIAGNOSIS — K589 Irritable bowel syndrome without diarrhea: Secondary | ICD-10-CM

## 2024-04-20 ENCOUNTER — Encounter: Payer: Self-pay | Admitting: Pharmacist

## 2024-05-07 ENCOUNTER — Other Ambulatory Visit: Admitting: Pharmacist

## 2024-05-07 DIAGNOSIS — E118 Type 2 diabetes mellitus with unspecified complications: Secondary | ICD-10-CM

## 2024-05-07 DIAGNOSIS — E785 Hyperlipidemia, unspecified: Secondary | ICD-10-CM

## 2024-05-07 NOTE — Progress Notes (Signed)
 05/07/2024 Name: Peggy Santiago MRN: [id number] DOB: 05-05-1951  Chief Complaint  Patient presents with   Medication Management   Hyperlipidemia   Diabetes    Peggy Santiago is a 73 y.o. year old female who had a phone visit with Clinical Pharmacist Practitioner today.    They were referred to the pharmacist by their PCP for assistance in managing diabetes and hyperlipidemia.    Subjective:  Medication Access/Adherence  Current Pharmacy:  [facility] [address] [place] - [address] [place] [address] Phone: [phone] Fax: [phone]   Patient reports affordability concerns with their medications: Yes  - Ozempic cost too high Patient reports access/transportation concerns to their pharmacy: No  Patient reports adherence concerns with their medications:  Yes     Diabetes / obesity:  Current medications: Ozempic 0.25mg  weekly   Current glucose readings: not checking blood glucose at home She has noticed decreased in appetite. She reports sometimes she feels nauseated but is manageable. She does voice concerns that she enjoys eating less and this worries her because she and her husband find joy trying new restaurants and socially going out to eat.   Started Ozempic around 01/02/2024 States she is tolerating Ozempic well. Has noticed a decrease in appetite.  Starting weight: 183lbs Current Weight: 159 lbs (home weight)  Last office weight was 168lbs Total weight loss: 24 lbs  Wt Readings from Last 3 Encounters:  03/06/24 168 lb (76.2 kg)  02/13/24 173 lb 6.4 oz (78.7 kg)  01/16/24 178 lb 6.4 oz (80.9 kg)   Patient has reported that she has had pancreatitis in the past. It was several years ago - she cannot remember when. Nothing in her chart noting this so likely was before 2012. She denies alcohol use now and also at time when she was diagnosed with pancreatitis.   Patient denies hypoglycemic s/sx including no  dizziness, shakiness, sweating. Patient denies hyperglycemic symptoms including no polyuria, polydipsia, polyphagia, nocturia, neuropathy, blurred vision.   Hyperlipidemia/ASCVD Risk Reduction  Current lipid lowering medications: Ezetimibe  10mg  daily    Medications tried in the past: rosuvastatin  atorvastatin , pravastatin , simvastatin  - call caused myalgias  Dr Watt has discussed starting PCSK9 with patient in the past but cost has been an issue. Today patient states she would prefer not to take an injection.  - Reviewed her 32 [employer] Medicare Part D plan. Patient has $573 deductible and then Repatha  or Praulent copay would be 20% of medication cost or around $125 / month. I think she might qualify for [facility] [place] that would likely cover any remaining cost and her deductible.   Co-Morbidities = type 2 DM   CT Cardiac Scoring 09/06/2022  IMPRESSION: 1. Coronary calcium  score of 7.3. This was 45 percentile for age-, race-, and sex-matched controls.   2.  Aortic atherosclerosis.   RECOMMENDATIONS: Coronary artery calcium  (CAC) score is a strong predictor of incident coronary heart disease (CHD) and provides predictive information beyond traditional risk factors. CAC scoring is reasonable to use in the decision to withhold, postpone, or initiate statin therapy in intermediate-risk or selected borderline-risk asymptomatic adults (age 18-75 years and LDL-C >=70 to <190 mg/dL) who do not have diabetes or established atherosclerotic cardiovascular disease (ASCVD).* In intermediate-risk (10-year ASCVD risk >=7.5% to <20%) adults or selected borderline-risk (10-year ASCVD risk >=5% to <7.5%) adults in whom a CAC score is measured for the purpose of making a treatment decision the following recommendations have been made:  If CAC=0, it is reasonable to withhold statin therapy and reassess in 5 to 10 years, as long as higher risk conditions are absent (diabetes mellitus, family  history of premature CHD in first degree relatives (males <55 years; females <65 years), cigarette smoking, or LDL >=190 mg/dL).   If CAC is 1 to 99, it is reasonable to initiate statin therapy for patients >=37 years of age.  Objective:  Lab Results  Component Value Date   HGBA1C 6.6 (H) 09/12/2023    Lab Results  Component Value Date   CREATININE 0.88 09/12/2023   BUN 22 09/12/2023   NA 141 09/12/2023   K 4.3 09/12/2023   CL 107 09/12/2023   CO2 27 09/12/2023    Lab Results  Component Value Date   CHOL 275 (H) 12/28/2023   HDL 43.70 12/28/2023   LDLCALC 174 (H) 12/28/2023   LDLDIRECT 161.0 08/25/2022   TRIG 285.0 (H) 12/28/2023   CHOLHDL 6 12/28/2023    Medications Reviewed Today     Reviewed by Carla Milling, RPH-CPP (Pharmacist) on 05/07/24 at 1451  Med List Status: <None>   Medication Order Taking? Sig Documenting Provider Last Dose Status Informant  acetaminophen  (TYLENOL ) 500 MG tablet [id number] Yes Take 500 mg by mouth every 8 (eight) hours as needed. [provider]  Active   Co-Enzyme Q10 100 MG CAPS [id number] Yes Take 100 mg by mouth daily. [provider]  Active   cyanocobalamin  (VITAMIN B12) 1000 MCG/ML injection [id number] Yes Inject 1 mL (1,000 mcg total) into the muscle every 30 (thirty) days. Copland, Harlene BROCKS, MD  Active   cyclobenzaprine  (FLEXERIL ) 10 MG tablet [id number] Yes Take 1 tablet (10 mg total) by mouth 2 (two) times daily as needed for muscle spasms. Copland, Harlene BROCKS, MD  Active   ezetimibe  (ZETIA ) 10 MG tablet [id number] Yes Take 1 tablet (10 mg total) by mouth daily. Copland, Harlene BROCKS, MD  Active    Patient not taking:   Discontinued 05/07/24 1443 (Completed Course)   Multiple Vitamin (MULTIVITAMIN WITH MINERALS) TABS tablet [id number]  Take 1 tablet by mouth daily. [provider]  Active Self  omeprazole  (PRILOSEC) 40 MG capsule [id number] Yes Take 1 capsule (40 mg total) by mouth daily. Copland, Harlene BROCKS, MD   Active   Semaglutide,0.25 or 0.5MG /DOS, (OZEMPIC, 0.25 OR 0.5 MG/DOSE,) 2 MG/3ML SOPN [id number] Yes Inject 0.25 mg into the skin once a week. [provider]  Active               Assessment/Plan:   Type 2 DM / obesity: weight has decreased since starting Ozempic and patient has tolerate well but expresses that is she cannot get Ozempic thru [facility] [facility] program and if cost with her part D plan is high she might stop Ozempic.  - continue Ozempic 0.25mg  weekly - she has about 3 pens on hand. She will have labs checked soon. Could consider lower dose of Ozempic to maintain blood glucose and weight loss - like 0.25mg  every OTHER week.   - Reviewed her 2026 [employer] Medicare Part D plan. Patient has $605 deductible and then Ozempic cost would be about $200 / month. Discussed reviewing for other plan to see if she can find one with lower deductible and lower copay for Ozempic. Patient will check with her insurance agent over the next 2 weeks. Pharmacy Payment plan is also an option - monthly cost for 12 months would be $175/month.   Hyperlipidemia/ASCVD Risk Reduction: LDL and Tg not  at goals (LDL < 100 and Tg < 150) though LDL has decreased significantly over the last year. Patient has not tolerated statins in the past.  - Continue ezetimibe  10mg  every day. Patient will have labs checked 05/15/24 - Continue CoEnzyme Q10 100mg  daily (already on her med list)     Madelin Ray, PharmD Clinical Pharmacist [facility]

## 2024-05-14 ENCOUNTER — Encounter: Payer: Self-pay | Admitting: *Deleted

## 2024-05-15 ENCOUNTER — Ambulatory Visit (INDEPENDENT_AMBULATORY_CARE_PROVIDER_SITE_OTHER): Admitting: *Deleted

## 2024-05-15 ENCOUNTER — Telehealth: Payer: Self-pay | Admitting: *Deleted

## 2024-05-15 ENCOUNTER — Other Ambulatory Visit (INDEPENDENT_AMBULATORY_CARE_PROVIDER_SITE_OTHER)

## 2024-05-15 ENCOUNTER — Encounter: Payer: Self-pay | Admitting: Family Medicine

## 2024-05-15 VITALS — BP 115/69 | HR 66 | Temp 98.9°F | Resp 18 | Ht 62.0 in | Wt 161.2 lb

## 2024-05-15 DIAGNOSIS — Z0000 Encounter for general adult medical examination without abnormal findings: Secondary | ICD-10-CM

## 2024-05-15 DIAGNOSIS — E118 Type 2 diabetes mellitus with unspecified complications: Secondary | ICD-10-CM

## 2024-05-15 DIAGNOSIS — E785 Hyperlipidemia, unspecified: Secondary | ICD-10-CM | POA: Diagnosis not present

## 2024-05-15 LAB — LIPID PANEL
Cholesterol: 237 mg/dL — ABNORMAL HIGH (ref 0–200)
HDL: 38.8 mg/dL — ABNORMAL LOW (ref >39.00)
LDL Cholesterol: 169 mg/dL — ABNORMAL HIGH (ref 0–99)
NonHDL: 198.34
Total CHOL/HDL Ratio: 6
Triglycerides: 149 mg/dL (ref 0.0–149.0)
VLDL: 29.8 mg/dL (ref 0.0–40.0)

## 2024-05-15 LAB — BASIC METABOLIC PANEL WITH GFR
BUN: 14 mg/dL (ref 6–23)
CO2: 25 meq/L (ref 19–32)
Calcium: 9.6 mg/dL (ref 8.4–10.5)
Chloride: 105 meq/L (ref 96–112)
Creatinine, Ser: 0.87 mg/dL (ref 0.40–1.20)
GFR: 66.76 mL/min (ref >60.00)
Glucose, Bld: 85 mg/dL (ref 70–99)
Potassium: 3.8 meq/L (ref 3.5–5.1)
Sodium: 140 meq/L (ref 135–145)

## 2024-05-15 LAB — MICROALBUMIN / CREATININE URINE RATIO
Creatinine,U: 118.4 mg/dL
Microalb Creat Ratio: UNDETERMINED mg/g (ref 0.0–30.0)
Microalb, Ur: <0.7 mg/dL

## 2024-05-15 LAB — HEMOGLOBIN A1C: Hgb A1c MFr Bld: 5.9 % (ref 4.6–6.5)

## 2024-05-15 NOTE — Progress Notes (Signed)
 Please attest this visit in the absence of patient primary care provider.    Subjective:   Peggy Santiago is a 73 y.o. female who presents for a [employer] Visit.  Allergies (verified) Statins, Contrast media [iodinated contrast media], Ioxaglate, and Rosuvastatin    History: Past Medical History:  Diagnosis Date   Allergy    Anemia    Cataracts, bilateral 09/2023   Chronic kidney disease    kidney stones   Diabetes mellitus without complication (HCC)    borderline   GERD (gastroesophageal reflux disease)    Osteoarthritis of knee    Pancreatitis, acute    patient unsure of date - was prior to   PONV (postoperative nausea and vomiting)    Past Surgical History:  Procedure Laterality Date   ABDOMINAL HYSTERECTOMY     Partial; one ovary remains   ANKLE SURGERY     with plate   APPENDECTOMY     CESAREAN SECTION     CHOLECYSTECTOMY     colon resection due to SBO     EYE SURGERY     FRACTURE SURGERY     JOINT REPLACEMENT     2 scopes rt knee   SMALL INTESTINE SURGERY     TOTAL KNEE ARTHROPLASTY Right 03/25/2014   Procedure: TOTAL KNEE ARTHROPLASTY;  Surgeon: Marcey Raman, MD;  Location: [facility] OR;  Service: Orthopedics;  Laterality: Right;   Family History  Problem Relation Age of Onset   Heart disease Mother    Rheum arthritis Mother    Alzheimer's disease Mother    Heart disease Father    Diabetes Sister    Heart disease Sister    Hyperlipidemia Sister    Cancer Brother    Heart disease Brother    Hyperlipidemia Brother    Hyperlipidemia Brother    Hyperlipidemia Sister    Heart disease Sister    Diabetes Sister    Social History   Occupational History   Occupation: diplomatic services operational officer  Tobacco Use   Smoking status: Never   Smokeless tobacco: Never  Vaping Use   Vaping status: Never Used  Substance and Sexual Activity   Alcohol use: No    Alcohol/week: 0.0 standard drinks of alcohol   Drug use: No   Sexual activity: Yes     Birth control/protection: Surgical   Tobacco Counseling Counseling given: Not Answered  SDOH Screenings   Food Insecurity: No Food Insecurity (05/15/2024)  Housing: Low Risk  (05/15/2024)  Transportation Needs: No Transportation Needs (05/15/2024)  Utilities: Not At Risk (05/15/2024)  Depression (PHQ2-9): Low Risk  (05/15/2024)  Financial Resource Strain: Low Risk  (12/27/2023)  Physical Activity: Sufficiently Active (05/15/2024)  Social Connections: Socially Integrated (05/15/2024)  Stress: No Stress Concern Present (05/15/2024)  Tobacco Use: Low Risk  (02/13/2024)  Health Literacy: Adequate Health Literacy (05/15/2024)   Depression Screen    05/15/2024    8:31 AM 09/12/2023    1:20 PM 08/25/2022   10:21 AM 08/24/2021    8:54 AM 08/21/2020    8:29 AM 06/29/2018    9:22 AM 06/23/2017    9:01 AM  PHQ 2/9 Scores  PHQ - 2 Score 0 0 0 0 0 0 0  PHQ- 9 Score 0           Goals Addressed             This Visit's Progress    to drink 4 glasses water a day         Visit  info / Clinical Intake: Medicare Wellness Visit Type:: Initial Annual Wellness Visit Medicare Wellness Visit Mode:: In-person (required for WTM) Interpreter Needed?: No Pre-visit prep was completed: yes AWV questionnaire completed by patient prior to visit?: no Living arrangements:: lives with spouse/significant other Patient's Overall Health Status Rating: good Typical amount of pain: some (worse when overdoes it, back and hip pain) Does pain affect daily life?: no Are you currently prescribed opioids?: no  Dietary Habits and Nutritional Risks How many meals a day?: (!) 1 (on Ozempic, eats when hungry and sometimes eats 2 meals daily) Eats fruit and vegetables daily?: yes Most meals are obtained by: preparing own meals; eating out Diabetic:: (!) yes Any non-healing wounds?: no How often do you check your BS?: 0 (only when she comes to the doctor) Would you like to be referred to a Nutritionist or for Diabetic  Management? : no  Functional Status Activities of Daily Living (to include ambulation/medication): Independent Ambulation: Independent Medication Administration: Independent Home Management: Independent Manage your own finances?: yes Primary transportation is: driving Concerns about vision?: no *vision screening is required for WTM* Concerns about hearing?: no  Fall Screening Falls in the past year?: 0 Number of falls in past year: 0 Was there an injury with Fall?: 0 Fall Risk Category Calculator: 0 Patient Fall Risk Level: Low Fall Risk  Fall Risk Patient at Risk for Falls Due to: No Fall Risks Fall risk Follow up: Falls evaluation completed  Home and Transportation Safety: All rugs have non-skid backing?: yes All stairs or steps have railings?: yes Grab bars in the bathtub or shower?: yes Have non-skid surface in bathtub or shower?: yes Good home lighting?: yes Regular seat belt use?: yes Hospital stays in the last year:: no  Cognitive Assessment Difficulty concentrating, remembering, or making decisions? : no Will 6CIT or Mini Cog be Completed: yes What year is it?: 0 points What month is it?: 0 points Give patient an address phrase to remember (5 components): [address] [place]  About what time is it?: 0 points Count backwards from 20 to 1: 0 points Say the months of the year in reverse: 0 points Repeat the address phrase from earlier: 0 points 6 CIT Score: 0 points  Advance Directives (For Healthcare) Does Patient Have a Medical Advance Directive?: Yes Does patient want to make changes to medical advance directive?: No - Patient declined Type of Advance Directive: Healthcare Power of [place]; Living will Copy of Healthcare Power of Attorney in Chart?: No - copy requested Copy of Living Will in Chart?: No - copy requested  Reviewed/Updated  Reviewed/Updated: All        Objective:    Today's Vitals   05/15/24 0813  BP: 115/69  Pulse: 66   Resp: 18  Temp: 98.9 F (37.2 C)  TempSrc: Oral  SpO2: 100%  Weight: 161 lb 3.2 oz (73.1 kg)  Height: 5' 2 (1.575 m)   Body mass index is 29.48 kg/m.  Current Medications (verified) Outpatient Encounter Medications as of 05/15/2024  Medication Sig   acetaminophen  (TYLENOL ) 500 MG tablet Take 500 mg by mouth every 8 (eight) hours as needed.   Co-Enzyme Q10 100 MG CAPS Take 100 mg by mouth daily.   cyanocobalamin  (VITAMIN B12) 1000 MCG/ML injection Inject 1 mL (1,000 mcg total) into the muscle every 30 (thirty) days.   cyclobenzaprine  (FLEXERIL ) 10 MG tablet Take 1 tablet (10 mg total) by mouth 2 (two) times daily as needed for muscle spasms.   ezetimibe  (ZETIA )  10 MG tablet Take 1 tablet (10 mg total) by mouth daily.   Multiple Vitamin (MULTIVITAMIN WITH MINERALS) TABS tablet Take 1 tablet by mouth daily.   omeprazole  (PRILOSEC) 40 MG capsule Take 1 capsule (40 mg total) by mouth daily.   Semaglutide,0.25 or 0.5MG /DOS, (OZEMPIC, 0.25 OR 0.5 MG/DOSE,) 2 MG/3ML SOPN Inject 0.25 mg into the skin once a week.   No facility-administered encounter medications on file as of 05/15/2024.   Hearing/Vision screen Hearing Screening - Comments:: Denies hearing difficulties.  Vision Screening - Comments:: Up to date with routine eye exams with [facility] Immunizations and Health Maintenance Health Maintenance  Topic Date Due   Diabetic kidney evaluation - Urine ACR  Never done   Zoster Vaccines- Shingrix (1 of 2) 05/13/1971   DTaP/Tdap/Td (2 - Td or Tdap) 06/11/2021   FOOT EXAM  02/17/2024   Colonoscopy  02/29/2024   Influenza Vaccine  10/09/2024 (Originally 02/10/2024)   COVID-19 Vaccine (3 - Pfizer risk series) 05/15/2025 (Originally 04/28/2020)   OPHTHALMOLOGY EXAM  09/15/2024   HEMOGLOBIN A1C  11/12/2024   Diabetic kidney evaluation - eGFR measurement  05/15/2025   Medicare Annual Wellness (AWV)  05/15/2025   Mammogram  09/11/2025   Pneumococcal Vaccine: 50+ Years   Completed   DEXA SCAN  Completed   Hepatitis C Screening  Completed   Meningococcal B Vaccine  Aged Out   Fecal DNA (Cologuard)  Discontinued        Assessment/Plan:  This is a routine wellness examination for Peggy Santiago.  Patient Care Team: Copland, Harlene BROCKS, MD as PCP - General (Family Medicine) Karis Clunes, MD as Consulting Physician (Otolaryngology) Rubie Kemps, MD as Consulting Physician (Orthopedic Surgery) Rox Charleston, MD as Consulting Physician (Obstetrics and Gynecology) Luis Purchase, MD as Consulting Physician (Gastroenterology) Livingston Rigg, MD as Consulting Physician (Dermatology) [facility]  I have personally reviewed and noted the following in the patient's chart:   Medical and social history Use of alcohol, tobacco or illicit drugs  Current medications and supplements including opioid prescriptions. Functional ability and status Nutritional status Physical activity Advanced directives List of other physicians Hospitalizations, surgeries, and ER visits in previous 12 months Vitals Screenings to include cognitive, depression, and falls Referrals and appointments  No orders of the defined types were placed in this encounter.  In addition, I have reviewed and discussed with patient certain preventive protocols, quality metrics, and best practice recommendations. A written personalized care plan for preventive services as well as general preventive health recommendations were provided to patient.   Lolita Libra, CMA   05/15/2024   Return in 1 year (on 05/15/2025).  After Visit Summary: (In Person-Printed) AVS printed and given to the patient  Nurse Notes: nothing significant to report

## 2024-05-15 NOTE — Patient Instructions (Addendum)
 Ms. Pae,  Thank you for taking the time for your Medicare Wellness Visit. I appreciate your continued commitment to your health goals. Please review the care plan we discussed, and feel free to reach out if I can assist you further.  Please note that Annual Wellness Visits do not include a physical exam. Some assessments may be limited, especially if the visit was conducted virtually. If needed, we may recommend an in-person follow-up with your provider.  Ongoing Care Seeing your primary care provider every 3 to 6 months helps us  monitor your health and provide consistent, personalized care.   Dr Watt:  09/13/24 9:20am, annual follow up Annual Wellness Visit: you declined to schedule this today. Please call [phone] when you are ready to schedule your next one.  Referrals If a referral was made during today's visit and you haven't received any updates within two weeks, please contact the referred provider directly to check on the status.  [facility]:  [phone]. Please call to schedule your colonoscopy consult  Recommended Screenings:  You will need to get the following vaccines at your local pharmacy: tetanus, shingles if you change your mind.  Health Maintenance  Topic Date Due   Medicare Annual Wellness Visit  Never done   Yearly kidney health urinalysis for diabetes  Never done   Zoster (Shingles) Vaccine (1 of 2) 05/13/1971   COVID-19 Vaccine (3 - Pfizer risk series) 04/28/2020   DTaP/Tdap/Td vaccine (2 - Td or Tdap) 06/11/2021   Complete foot exam   02/17/2024   Colon Cancer Screening  02/29/2024   Hemoglobin A1C  03/14/2024   Flu Shot  10/09/2024*   Yearly kidney function blood test for diabetes  09/11/2024   Eye exam for diabetics  09/15/2024   Breast Cancer Screening  09/11/2025   Pneumococcal Vaccine for age over 65  Completed   DEXA scan (bone density measurement)  Completed   Hepatitis C Screening  Completed   Meningitis B Vaccine  Aged Out   Cologuard  (Stool DNA test)  Discontinued  *Topic was postponed. The date shown is not the original due date.       05/15/2024    8:17 AM  Advanced Directives  Does Patient Have a Medical Advance Directive? Yes  Type of Estate Agent of [place];Living will  Does patient want to make changes to medical advance directive? No - Patient declined  Copy of Healthcare Power of Attorney in Chart? No - copy requested   Bring a copy of your health care power of attorney and living will to the office to be added to your chart at your convenience. You can mail a copy to [facility] [address] [place], [place] [address] or email to [email or web address]   Vision: Annual vision screenings are recommended for early detection of glaucoma, cataracts, and diabetic retinopathy. These exams can also reveal signs of chronic conditions such as diabetes and high blood pressure.  Dental: Annual dental screenings help detect early signs of oral cancer, gum disease, and other conditions linked to overall health, including heart disease and diabetes.  Please see the attached documents for additional preventive care recommendations.

## 2024-05-15 NOTE — Telephone Encounter (Signed)
 Pt has AWV today and due for urine MALB. Also due for additional future labs. MALB order added.

## 2024-05-16 ENCOUNTER — Other Ambulatory Visit

## 2024-05-21 ENCOUNTER — Ambulatory Visit (INDEPENDENT_AMBULATORY_CARE_PROVIDER_SITE_OTHER): Admitting: Sports Medicine

## 2024-05-21 ENCOUNTER — Encounter: Payer: Self-pay | Admitting: Family Medicine

## 2024-05-21 VITALS — BP 108/76 | HR 65 | Ht 62.0 in | Wt 159.0 lb

## 2024-05-21 DIAGNOSIS — M5442 Lumbago with sciatica, left side: Secondary | ICD-10-CM

## 2024-05-21 DIAGNOSIS — M5416 Radiculopathy, lumbar region: Secondary | ICD-10-CM

## 2024-05-21 DIAGNOSIS — G8929 Other chronic pain: Secondary | ICD-10-CM

## 2024-05-21 DIAGNOSIS — M51362 Other intervertebral disc degeneration, lumbar region with discogenic back pain and lower extremity pain: Secondary | ICD-10-CM

## 2024-05-21 MED ORDER — METHYLPREDNISOLONE 4 MG PO TBPK: ORAL_TABLET | ORAL | 0 refills | Status: AC

## 2024-05-21 MED ORDER — KETOROLAC TROMETHAMINE 60 MG/2ML IM SOLN
60.0000 mg | Freq: Once | INTRAMUSCULAR | Status: AC
  Administered 2024-05-21: 60 mg via INTRAMUSCULAR

## 2024-05-21 MED ORDER — METHYLPREDNISOLONE ACETATE 80 MG/ML IJ SUSP
80.0000 mg | Freq: Once | INTRAMUSCULAR | Status: AC
  Administered 2024-05-21: 80 mg via INTRAMUSCULAR

## 2024-05-21 NOTE — Patient Instructions (Addendum)
 Prednisone  and toradol  given today  Prednisone  dose pak-start tmrw Repeat epidural L L4/L5 See me 2 weeks after epidural

## 2024-05-21 NOTE — Progress Notes (Signed)
 Ben Cashis Rill D.[facility] [address] [place] [address] Phone: [phone]   Assessment and Plan:     1. Lumbar radiculopathy (Primary) 2. Chronic bilateral low back pain with left-sided sciatica 3. Degeneration of intervertebral disc of lumbar region with discogenic back pain and lower extremity pain -Chronic with exacerbation, subsequent visit - Recurrence of low back pain with left-sided radicular symptoms consistent with lumbar DDD and radiculopathy - Patient has had significant relief in the past with first epidural CSI, the second epidural provided no significant relief.  Recommend repeat epidural CSI to left-sided L4-L5 - Patient elected for IM injection of methylprednisone 80 mg/Toradol  60 mg.  Injection given in clinic today and tolerated well. -Start prednisone  Dosepak tomorrow    Pertinent previous records reviewed include none   Follow Up: 2 weeks after epidural to review benefit   Subjective:    Chief Complaint: Severe worsening of low back pain and left-sided radicular symptoms  HPI:   05/21/24 L hip pain from glute that radiates into the L quad since Thursday.  Cooked for 3 hours the day prior. Sitting is painful. Pain radiates down into the lower leg. Using Vicodin, Celebrex , mm relaxer, IBU, Tylenol . Worsening over weekend.    Relevant Historical Information: IBS, DM type II  Additional pertinent review of systems negative.   Current Outpatient Medications:    acetaminophen  (TYLENOL ) 500 MG tablet, Take 500 mg by mouth every 8 (eight) hours as needed., Disp: , Rfl:    Co-Enzyme Q10 100 MG CAPS, Take 100 mg by mouth daily., Disp: , Rfl:    cyanocobalamin  (VITAMIN B12) 1000 MCG/ML injection, Inject 1 mL (1,000 mcg total) into the muscle every 30 (thirty) days., Disp: 3 mL, Rfl: 1   cyclobenzaprine  (FLEXERIL ) 10 MG tablet, Take 1 tablet (10 mg total) by mouth 2 (two) times daily as needed for muscle spasms., Disp:  15 tablet, Rfl: 0   ezetimibe  (ZETIA ) 10 MG tablet, Take 1 tablet (10 mg total) by mouth daily., Disp: 90 tablet, Rfl: 1   methylPREDNISolone  (MEDROL  DOSEPAK) 4 MG TBPK tablet, Take 6 tablets on day 1.  Take 5 tablets on day 2.  Take 4 tablets on day 3.  Take 3 tablets on day 4.  Take 2 tablets on day 5.  Take 1 tablet on day 6., Disp: 21 tablet, Rfl: 0   Multiple Vitamin (MULTIVITAMIN WITH MINERALS) TABS tablet, Take 1 tablet by mouth daily., Disp: , Rfl:    omeprazole  (PRILOSEC) 40 MG capsule, Take 1 capsule (40 mg total) by mouth daily., Disp: 90 capsule, Rfl: 1   Semaglutide,0.25 or 0.5MG /DOS, (OZEMPIC, 0.25 OR 0.5 MG/DOSE,) 2 MG/3ML SOPN, Inject 0.25 mg into the skin once a week., Disp: , Rfl:    Objective:     Vitals:   05/21/24 0955  BP: 108/76  Pulse: 65  SpO2: 97%  Weight: 159 lb (72.1 kg)  Height: 5' 2 (1.575 m)      Body mass index is 29.08 kg/m.    Physical Exam:    Gen: Appears well, nad, nontoxic and pleasant Psych: Alert and oriented, appropriate mood and affect Neuro: Decreased sensation along entirety of left leg compared to right.  strength is 5/5 in upper and lower extremities, muscle tone wnl Skin: no susupicious lesions or rashes   Back - Normal skin, Spine with normal alignment and no deformity.   No tenderness to vertebral process palpation.   Bilateral paraspinous muscles are significantly tender and  without spasm  TTP gluteal musculature, greater trochanter Straight leg raise positive left, negative right Trendelenberg positive left Piriformis Test negative for radicular pain, though reproduced tightness on left not present on right Gait antalgic, favoring right leg    Electronically signed by:  Odis Mace D.[facility] 10:26 AM 05/21/24

## 2024-05-22 ENCOUNTER — Telehealth: Payer: Self-pay

## 2024-05-22 MED ORDER — PREDNISONE 50 MG PO TABS: ORAL_TABLET | ORAL | 0 refills | Status: AC

## 2024-05-22 MED ORDER — DIPHENHYDRAMINE HCL 50 MG PO TABS
ORAL_TABLET | ORAL | 0 refills | Status: AC
Start: 2024-05-22 — End: ?

## 2024-05-22 NOTE — Progress Notes (Signed)
 See telephone note

## 2024-05-23 ENCOUNTER — Ambulatory Visit: Admitting: Sports Medicine

## 2024-05-24 ENCOUNTER — Telehealth: Payer: Self-pay | Admitting: Sports Medicine

## 2024-05-24 NOTE — Telephone Encounter (Signed)
 Patients husband Dasie called and stated that after  the patient has taken the medicine and had the shots that she does not have much relief. Her leg is giving her a fit. He is wondering if there is anything else along with the steroid she is taking that could give her some relief for the pain until she can get that epidural on the 24th of November.  Please advise.

## 2024-05-30 ENCOUNTER — Encounter: Payer: Self-pay | Admitting: Physician Assistant

## 2024-06-01 NOTE — Discharge Instructions (Signed)

## 2024-06-04 ENCOUNTER — Ambulatory Visit
Admission: RE | Admit: 2024-06-04 | Discharge: 2024-06-04 | Disposition: A | Discharge status: Home / Self Care | Source: Ambulatory Visit | Attending: Sports Medicine | Admitting: Sports Medicine

## 2024-06-04 DIAGNOSIS — M4727 Other spondylosis with radiculopathy, lumbosacral region: Secondary | ICD-10-CM | POA: Diagnosis not present

## 2024-06-04 DIAGNOSIS — M5416 Radiculopathy, lumbar region: Secondary | ICD-10-CM

## 2024-06-04 MED ORDER — METHYLPREDNISOLONE ACETATE 40 MG/ML INJ SUSP (RADIOLOG
80.0000 mg | Freq: Once | INTRAMUSCULAR | Status: AC
  Administered 2024-06-04: 80 mg via EPIDURAL

## 2024-06-04 MED ORDER — IOPAMIDOL (ISOVUE-M 200) INJECTION 41%
1.0000 mL | Freq: Once | INTRAMUSCULAR | Status: AC
  Administered 2024-06-04: 1 mL via EPIDURAL

## 2024-06-15 NOTE — Progress Notes (Signed)
 Ben Giovoni Bunch D.[facility] [address] [place] [address] Phone: [phone]   Assessment and Plan:     1. Lumbar radiculopathy (Primary) 2. Chronic bilateral low back pain with left-sided sciatica 3. Degeneration of intervertebral disc of lumbar region with discogenic back pain and lower extremity pain -Chronic with exacerbation, subsequent visit - Continued low back pain with left-sided radicular symptoms most consistent with lumbar DDD with radiculopathy.  Patient had mild to moderate improvement in back pain after epidural CSI, however no improvement in hypersensitivity radiating down left lower extremity. - Patient has not trialed 3 epidural CSI with incomplete relief.  Recommend further evaluation with neurosurgery to discuss next steps in treatment plan - We have previously used prednisone  Dosepaks, IM injection methylprednisone/Toradol  for mild temporary relief in pain.  Could consider additional medication in the future - Start gabapentin  100 mg nightly.  If well-tolerated, but no significant improvement in symptoms, increase to gabapentin  100 mg twice daily.  If well-tolerated, but no significant improvement in symptoms, increase to gabapentin  200 mg twice daily.  Prescription provided    Pertinent previous records reviewed include epidural procedure note x 3   Follow Up: After neurosurgery establishing visit.  Would discuss gabapentin  continuation versus physical therapy versus neurosurgery recommendations   Subjective:   I, Claretha Schimke am a scribe for Dr. Leonce.    Chief Complaint: Severe worsening of low back pain and left-sided radicular symptoms   HPI:    05/21/24 L hip pain from glute that radiates into the L quad since Thursday.  Cooked for 3 hours the day prior. Sitting is painful. Pain radiates down into the lower leg. Using Vicodin, Celebrex , mm relaxer, IBU, Tylenol . Worsening over weekend.    06/18/2024 Patient  states that the injected helped some things but not others. The leg is still hurting in the calf area.    Relevant Historical Information: IBS, DM type II    Additional pertinent review of systems negative.   Current Outpatient Medications:    acetaminophen  (TYLENOL ) 500 MG tablet, Take 500 mg by mouth every 8 (eight) hours as needed., Disp: , Rfl:    Co-Enzyme Q10 100 MG CAPS, Take 100 mg by mouth daily., Disp: , Rfl:    cyanocobalamin  (VITAMIN B12) 1000 MCG/ML injection, Inject 1 mL (1,000 mcg total) into the muscle every 30 (thirty) days., Disp: 3 mL, Rfl: 1   cyclobenzaprine  (FLEXERIL ) 10 MG tablet, Take 1 tablet (10 mg total) by mouth 2 (two) times daily as needed for muscle spasms., Disp: 15 tablet, Rfl: 0   diphenhydrAMINE  (BENADRYL ) 50 MG tablet, Pt is also to take 50 mg of benadryl  on 06/04/24 at 10:00 AM with the 10:00 AM Prednisone  dose. Please call [phone] with any questions., Disp: 1 tablet, Rfl: 0   ezetimibe  (ZETIA ) 10 MG tablet, Take 1 tablet (10 mg total) by mouth daily., Disp: 90 tablet, Rfl: 1   gabapentin  (NEURONTIN ) 100 MG capsule, Take 2 capsules (200 mg total) by mouth 2 (two) times daily., Disp: 80 capsule, Rfl: 1   methylPREDNISolone  (MEDROL  DOSEPAK) 4 MG TBPK tablet, Take 6 tablets on day 1.  Take 5 tablets on day 2.  Take 4 tablets on day 3.  Take 3 tablets on day 4.  Take 2 tablets on day 5.  Take 1 tablet on day 6., Disp: 21 tablet, Rfl: 0   Multiple Vitamin (MULTIVITAMIN WITH MINERALS) TABS tablet, Take 1 tablet by mouth daily., Disp: , Rfl:  omeprazole  (PRILOSEC) 40 MG capsule, Take 1 capsule (40 mg total) by mouth daily., Disp: 90 capsule, Rfl: 1   predniSONE  (DELTASONE ) 50 MG tablet, Pt to take 50 mg of prednisone  on 06/03/24 at 10:00 PM, 50 mg of prednisone  on 06/04/24 at 4:00 am, and 50 mg of prednisone  on 06/04/24 at 10:00 AM. Pt is also to take 50 mg of benadryl  on 06/04/24 at 10:00 AM. Please call [phone] with any questions., Disp: 3 tablet, Rfl:  0   Semaglutide,0.25 or 0.5MG /DOS, (OZEMPIC, 0.25 OR 0.5 MG/DOSE,) 2 MG/3ML SOPN, Inject 0.25 mg into the skin once a week., Disp: , Rfl:    Objective:     Vitals:   06/18/24 0930  BP: 112/70  Pulse: 85  SpO2: 96%  Weight: 157 lb (71.2 kg)  Height: 5' 2 (1.575 m)      Body mass index is 28.72 kg/m.    Physical Exam:    Gen: Appears well, nad, nontoxic and pleasant Psych: Alert and oriented, appropriate mood and affect Neuro: Hypersensitivity sensation along entirety of left leg compared to right.  strength is 5/5 in upper and lower extremities, muscle tone wnl Skin: no susupicious lesions or rashes   Back - Normal skin, Spine with normal alignment and no deformity.   No tenderness to vertebral process palpation.   Bilateral paraspinous muscles are significantly tender and without spasm  TTP gluteal musculature, greater trochanter Straight leg raise positive left, negative right Trendelenberg positive left Piriformis Test negative for radicular pain, though reproduced tightness on left not present on right Gait antalgic, favoring right leg    Electronically signed by:  Odis Mace D[facility] 10:08 AM 06/18/24

## 2024-06-18 ENCOUNTER — Ambulatory Visit: Admitting: Sports Medicine

## 2024-06-18 VITALS — BP 112/70 | HR 85 | Ht 62.0 in | Wt 157.0 lb

## 2024-06-18 DIAGNOSIS — G8929 Other chronic pain: Secondary | ICD-10-CM | POA: Diagnosis not present

## 2024-06-18 DIAGNOSIS — M5416 Radiculopathy, lumbar region: Secondary | ICD-10-CM

## 2024-06-18 DIAGNOSIS — M51362 Other intervertebral disc degeneration, lumbar region with discogenic back pain and lower extremity pain: Secondary | ICD-10-CM | POA: Diagnosis not present

## 2024-06-18 MED ORDER — GABAPENTIN 100 MG PO CAPS: 200.0000 mg | ORAL_CAPSULE | Freq: Two times a day (BID) | ORAL | 1 refills | Status: AC

## 2024-06-18 NOTE — Patient Instructions (Addendum)
 [facility] Dr Onetha. Start gabapentin  100 mg if not affective can increase to 200 mg daily. If not affective, can increase to 200 mg twice daily.  Follow up with me after neurosurgery appointment.

## 2024-06-26 DIAGNOSIS — Z6828 Body mass index (BMI) 28.0-28.9, adult: Secondary | ICD-10-CM | POA: Diagnosis not present

## 2024-06-26 DIAGNOSIS — M4316 Spondylolisthesis, lumbar region: Secondary | ICD-10-CM | POA: Diagnosis not present

## 2024-07-17 ENCOUNTER — Ambulatory Visit: Admitting: Physician Assistant

## 2024-07-17 ENCOUNTER — Encounter: Payer: Self-pay | Admitting: Physician Assistant

## 2024-07-17 ENCOUNTER — Other Ambulatory Visit

## 2024-07-17 VITALS — BP 118/80 | HR 92 | Ht 61.0 in | Wt 156.5 lb

## 2024-07-17 DIAGNOSIS — R1013 Epigastric pain: Secondary | ICD-10-CM

## 2024-07-17 DIAGNOSIS — K219 Gastro-esophageal reflux disease without esophagitis: Secondary | ICD-10-CM | POA: Diagnosis not present

## 2024-07-17 DIAGNOSIS — K581 Irritable bowel syndrome with constipation: Secondary | ICD-10-CM | POA: Diagnosis not present

## 2024-07-17 DIAGNOSIS — D510 Vitamin B12 deficiency anemia due to intrinsic factor deficiency: Secondary | ICD-10-CM | POA: Diagnosis not present

## 2024-07-17 DIAGNOSIS — Z860101 Personal history of adenomatous and serrated colon polyps: Secondary | ICD-10-CM | POA: Diagnosis not present

## 2024-07-17 DIAGNOSIS — E118 Type 2 diabetes mellitus with unspecified complications: Secondary | ICD-10-CM

## 2024-07-17 LAB — HEPATIC FUNCTION PANEL
ALT: 10 U/L (ref 3–35)
AST: 13 U/L (ref 5–37)
Albumin: 4.2 g/dL (ref 3.5–5.2)
Alkaline Phosphatase: 49 U/L (ref 39–117)
Bilirubin, Direct: 0 mg/dL — ABNORMAL LOW (ref 0.1–0.3)
Total Bilirubin: 0.4 mg/dL (ref 0.2–1.2)
Total Protein: 6.8 g/dL (ref 6.0–8.3)

## 2024-07-17 LAB — LIPASE: Lipase: 68 U/L — ABNORMAL HIGH (ref 11.0–59.0)

## 2024-07-17 NOTE — Patient Instructions (Addendum)
 Your provider has requested that you go to the basement level for lab work before leaving today. Press B on the elevator. The lab is located at the first door on the left as you exit the elevator.  Call back when you are ready to schedule your colonoscopy, we will like just have to do a previsit unless something changes with your health. Feel free to reach out in the mean with any questions or issues.   FIBER SUPPLEMENT You can do metamucil or fibercon once or twice a day but if this causes gas/bloating please switch to Benefiber or Citracel.  Fiber is good for constipation/diarrhea/irritable bowel syndrome.  It can also help with weight loss and can help lower your bad cholesterol (LDL).  Please do 1 TBSP in the morning in water, coffee, or tea.  It can take up to a month before you can see a difference with your bowel movements.  It is cheapest from [employer], [employer], [employer].   Miralax is an osmotic laxative.  It only brings more water into the stool.  This is safe to take daily.  Can take up to 17 gram of miralax twice a day.  Mix with juice or coffee.  Start 1 capful for 3-4 days and reassess your response in 3-4 days.  You can increase and decrease the dose based on your response.  Remember, it can take up to 3-4 days to take effect OR for the effects to wear off.   I often pair this with benefiber in the morning to help assure the stool is not too loose.    Toileting tips to help with your constipation - Drink at least 64-80 ounces of water/liquid per day. - Establish a time to try to move your bowels every day.  For many people, this is after a cup of coffee or after a meal such as breakfast. - Sit all of the way back on the toilet keeping your back fairly straight and while sitting up, try to rest the tops of your forearms on your upper thighs.   - Raising your feet with a step stool/squatty potty can be helpful to improve the angle that allows your stool to pass through the  rectum. - Relax the rectum feeling it bulge toward the toilet water.  If you feel your rectum raising toward your body, you are contracting rather than relaxing. - Breathe in and slowly exhale. Belly breath by expanding your belly towards your belly button. Keep belly expanded as you gently direct pressure down and back to the anus.  A low pitched GRRR sound can assist with increasing intra-abdominal pressure.  (Can also trying to blow on a pinwheel and make it move, this helps with the same belly breathing) - Repeat 3-4 times. If unsuccessful, contract the pelvic floor to restore normal tone and get off the toilet.  Avoid excessive straining. - To reduce excessive wiping by teaching your anus to normally contract, place hands on outer aspect of knees and resist knee movement outward.  Hold 5-10 second then place hands just inside of knees and resist inward movement of knees.  Hold 5 seconds.  Repeat a few times each way.  Go to the ER if unable to pass gas, severe AB pain, unable to hold down food, any shortness of breath of chest pain.  Understanding Your Weekly GLP-1 Injection  A helpful guide to managing common side effects  You are on a once-weekly injectable medication in the GLP-1 receptor agonist class. These  medications can be very effective for blood sugar control, weight loss, and heart protection, fatty liver or OSA, but they can also come with some side effects that are important to understand. The good news is: most side effects can be managed with a few adjustments.  1. Gastroparesis-Like Symptoms These medications slow down your stomach to help you feel full longer -- great for weight loss and blood sugar control, but they can sometimes cause symptoms that feel like gastroparesis (slow stomach emptying). Symptoms may include: -Feeling full quickly when eating -Nausea or vomiting -Bloating or abdominal discomfort -Worsening heartburn or reflux -Acid regurgitation -Stomach  spasms or tightness What you can do: ??? Eat small, frequent meals (4-6 per day) ?? Drink fluids between meals, not during ?? Avoid high-fiber foods (like raw veggies or whole grains); cook your veggies well ?? Spread protein throughout the day (try Greek yogurt, eggs, soft meats, Glucerna, milk) ?? Choose soft foods you can mash with a fork ?? Switch to pured foods or liquids during flare-ups ?? Consider reading: Living Well with Gastroparesis by Camelia Bone ?? Downloadable Diet Guide: [facility] Gastroparesis Diet PDF  ?? Tip: Try following a gastroparesis-friendly diet on the day of your injection and the day after, when the medication's effect is strongest.  2. Constipation Since this medication slows down your digestive system, constipation is very common. Tips to help: ?? Drink plenty of water ???? Stay active with regular exercise ?? Add fiber-rich but gentle foods like kiwi ?? Try a low-dose magnesium supplement at night ?? Use MiraLAX (half to one capful daily) if constipation becomes more frequent, especially if your dose increases  If these strategies dont help, talk to your provider -- they may recommend or prescribe other treatments.  3. When to Call the Doctor or Go to the ER While rare, this medication can slightly increase your risk of serious conditions like: Pancreatitis (inflammation of the pancreas) Gallstones or gallbladder problems Watch for these signs and seek help if you experience: Severe abdominal pain (especially in the upper belly or that radiates to your back) Pain in the right upper side of your abdomen Nausea, vomiting, fever, or chills that dont go away ?? Call your provider or go to the ER if these occur.  4. Who Should NOT Take This Medication? This medication should be avoided if you have: A personal history of pancreatitis  A personal or family history of medullary thyroid  cancer A condition called Multiple Endocrine Neoplasia  Syndrome Type 2 (MEN2)  Final Note If the side effects are too bothersome, remember: Most symptoms will go away if you stop the medication. But many people tolerate it well after the first few weeks, especially with the right strategies in place.  ONLY IF YOU NEED IT Purchase a bottle of Miralax over the counter as well as a box of 5 mg dulcolax tablets. Take 4 dulcolax tablets. Wait 1 hour. You will then drink 6-8 capfuls of Miralax mixed in an adequate amount of water/juice/gatorade (you may choose which of these liquids to drink) over the next 2-3 hours. You should expect results within 1 to 6 hours after completing the bowel purge. Go to the er if you have severe AB pain, can not pass gas or stool in over 12 hours, can not hold down any food.   I appreciate the  opportunity to care for you  Thank You   [facility]

## 2024-07-17 NOTE — Progress Notes (Signed)
 "    07/17/2024 KHALEELAH YOWELL [id number] 11/05/1950  Referring provider: Watt Harlene BROCKS, MD Primary GI doctor: Dr. Federico  ASSESSMENT AND PLAN:  Personal history of tubular adenomatous polyps 03/01/2019 colonoscopy Dr. Luis, unable to see report but had tubular adenomatous polyp removed recall 5 years, also states has internal hemorrhoids that she controls with diet She is having back surgery and would prefer to wait until after this to schedule her colonoscopy Will call us  back afterwards, will need previsit Will need to hold GLP1   IBS- constipation Status post cholecystectomy Likely worsening from GLP1, back pain - Increase fiber/ water intake, decrease caffeine, increase activity level. -Will get KUB, consider CT AB and pelvis with contrast -Will add on Miralax twice a day and Benefiber -consider referral to pelvic floor PT  GERD S/p cholecystectomy  Well controlled with omeprazole  Mild epigastric pain on palpation -Check lipase and LFTs, consider US  or CT if abnormal - given gastroparesis diet due to GLP1 - continue omeprazole   Type 2 diabetes improved on GLP1 09/06/2022 cardiac scoring 7.3, 45 percentile Discussed GLP1 with the patient, mechanism of action. Gastroparesis diet given to the patient.  Patient should be instructed to hold this medications if dose falls within 7 days of endoscopic procedure, due to increased risk of retained gastric contents.  Pernicious anemia Had surgery when she was 74 years old for blockage, states had 5 feet of colon removed but uncertain is small bowel or large On B12 injection  Patient Care Team: Copland, Harlene BROCKS, MD as PCP - General (Family Medicine) Karis Clunes, MD as Consulting Physician (Otolaryngology) Rubie Kemps, MD as Consulting Physician (Orthopedic Surgery) Rox Charleston, MD as Consulting Physician (Obstetrics and Gynecology) Luis Purchase, MD as Consulting Physician (Gastroenterology) Livingston Rigg, MD as  Consulting Physician (Dermatology) [facility]  HISTORY OF PRESENT ILLNESS: 74 y.o. female with a past medical history listed below presents for evaluation of constipation, screening colonoscopy.   New patient to our office, [facility].  Discussed the use of AI scribe software for clinical note transcription with the patient, who gave verbal consent to proceed.  History of Present Illness   CALLA WEDEKIND is a 74 year old female with a history of colon polyps, prior bowel surgery, type 2 diabetes, and pernicious anemia who presents for colonoscopy planning.  Colonoscopy in August 2020 revealed tubular adenomatous polyps, which were removed. She was advised to repeat colonoscopy every five years. A prior Cologuard test was positive, but subsequent colonoscopy was normal. She prefers to delay her next colonoscopy until after upcoming back surgery.  Bowel habits have been unpredictable throughout her life, alternating between constipation and diarrhea. Bowel movements may occur immediately after eating or be delayed up to four days. She manages symptoms with daily stool softeners, Metamucil every other night, and has previously used Miralax, which she prefers. Benefiber is also used. Hard stools are more frequent than diarrhea. Occasional bloating occurs, but not consistently. Internal hemorrhoids have flared in the past with certain foods but are currently asymptomatic. No blood in stool, black/tarry stools, or changes in stool appearance. Heartburn is well controlled and no trouble swallowing.  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 Cholecystectomy was performed for gallstones, and she had a prior episode of pancreatitis likely related to gallstones. Pernicious anemia is managed with monthly B12 injections, with two missed doses recently due to back  issues; B12  level was low ten months ago. Diabetes is managed with methotrexate and Ozempic (semaglutide) at a low dose, with initial nausea now resolved and approximately 30 pounds of weight loss over six months. Most recent hemoglobin A1c was 5.7%. Statin allergy is noted and cholesterol reduction has not been achieved with current therapy.  She recalls an MRI several years ago mentioning diverticulitis, but has never been formally diagnosed or had symptoms attributed to it. No urinary incontinence, nocturia except for getting up once per night. Her daughter assists with her care.        She  reports that she has never smoked. She has never used smokeless tobacco. She reports that she does not drink alcohol and does not use drugs.  RELEVANT GI HISTORY, IMAGING AND LABS: Results   Labs LFTs (09/2023): Within normal limits Vitamin B12 (09/2023): Low      CBC    Component Value Date/Time   WBC 6.9 09/12/2023 1409   RBC 4.12 09/12/2023 1409   HGB 12.8 09/12/2023 1409   HCT 38.5 09/12/2023 1409   PLT 262.0 09/12/2023 1409   MCV 93.4 09/12/2023 1409   MCV 88.3 03/20/2015 0837   MCH 27.8 03/20/2015 0837   MCH 29.7 03/26/2014 0507   MCHC 33.1 09/12/2023 1409   RDW 13.8 09/12/2023 1409   LYMPHSABS 2.0 06/26/2021 0908   MONOABS 0.4 06/26/2021 0908   EOSABS 0.3 06/26/2021 0908   BASOSABS 0.0 06/26/2021 0908   Recent Labs    09/12/23 1409  HGB 12.8    CMP     Component Value Date/Time   NA 140 05/15/2024 0902   K 3.8 05/15/2024 0902   CL 105 05/15/2024 0902   CO2 25 05/15/2024 0902   GLUCOSE 85 05/15/2024 0902   BUN 14 05/15/2024 0902   CREATININE 0.87 05/15/2024 0902   CREATININE 0.87 06/10/2015 1110   CALCIUM  9.6 05/15/2024 0902   PROT 6.6 09/12/2023 1409   ALBUMIN 4.1 09/12/2023 1409   AST 13 09/12/2023 1409   ALT 16 09/12/2023 1409   ALKPHOS 64 09/12/2023 1409   BILITOT 0.3 09/12/2023 1409   GFRNONAA 76 (L) 03/26/2014 0507   GFRAA 88 (L) 03/26/2014 0507      Latest Ref  Rng & Units 09/12/2023    2:09 PM 08/25/2022   11:08 AM 11/25/2021   11:08 AM  Hepatic Function  Total Protein 6.0 - 8.3 g/dL 6.6  7.4  7.1   Albumin 3.5 - 5.2 g/dL 4.1  4.3  4.3   AST 0 - 37 U/L 13  16  16    ALT 0 - 35 U/L 16  16  18    Alk Phosphatase 39 - 117 U/L 64  70  67   Total Bilirubin 0.2 - 1.2 mg/dL 0.3  0.8  0.6       Current Medications:   Current Outpatient Medications (Endocrine & Metabolic):    methylPREDNISolone  (MEDROL  DOSEPAK) 4 MG TBPK tablet, Take 6 tablets on day 1.  Take 5 tablets on day 2.  Take 4 tablets on day 3.  Take 3 tablets on day 4.  Take 2 tablets on day 5.  Take 1 tablet on day 6.   predniSONE  (DELTASONE ) 50 MG tablet, Pt to take 50 mg of prednisone  on 06/03/24 at 10:00 PM, 50 mg of prednisone  on 06/04/24 at 4:00 am, and 50 mg of prednisone  on 06/04/24 at 10:00 AM. Pt is also to take 50 mg of benadryl  on 06/04/24 at 10:00  AM. Please call [phone] with any questions.   Semaglutide,0.25 or 0.5MG /DOS, (OZEMPIC, 0.25 OR 0.5 MG/DOSE,) 2 MG/3ML SOPN, Inject 0.25 mg into the skin once a week.  Current Outpatient Medications (Cardiovascular):    ezetimibe  (ZETIA ) 10 MG tablet, Take 1 tablet (10 mg total) by mouth daily.  Current Outpatient Medications (Respiratory):    BANOPHEN  25 MG tablet, Take 50 mg by mouth daily.   diphenhydrAMINE  (BENADRYL ) 50 MG tablet, Pt is also to take 50 mg of benadryl  on 06/04/24 at 10:00 AM with the 10:00 AM Prednisone  dose. Please call [phone] with any questions.  Current Outpatient Medications (Analgesics):    acetaminophen  (TYLENOL ) 500 MG tablet, Take 500 mg by mouth every 8 (eight) hours as needed.  Current Outpatient Medications (Hematological):    cyanocobalamin  (VITAMIN B12) 1000 MCG/ML injection, Inject 1 mL (1,000 mcg total) into the muscle every 30 (thirty) days.  Current Outpatient Medications (Other):    Co-Enzyme Q10 100 MG CAPS, Take 100 mg by mouth daily.   cyclobenzaprine  (FLEXERIL ) 10 MG tablet, Take 1  tablet (10 mg total) by mouth 2 (two) times daily as needed for muscle spasms.   gabapentin  (NEURONTIN ) 100 MG capsule, Take 2 capsules (200 mg total) by mouth 2 (two) times daily.   Multiple Vitamin (MULTIVITAMIN WITH MINERALS) TABS tablet, Take 1 tablet by mouth daily.   omeprazole  (PRILOSEC) 40 MG capsule, Take 1 capsule (40 mg total) by mouth daily.  Medical History:  Past Medical History:  Diagnosis Date   Allergy    Anemia    Cataracts, bilateral 09/2023   Chronic kidney disease    kidney stones   Diabetes mellitus without complication (HCC)    borderline   GERD (gastroesophageal reflux disease)    Osteoarthritis of knee    Pancreatitis, acute    patient unsure of date - was prior to   PONV (postoperative nausea and vomiting)    Allergies: Allergies[1]   Surgical History:  She  has a past surgical history that includes Appendectomy; Cholecystectomy; colon resection due to SBO; Eye surgery; Joint replacement; Ankle surgery; Total knee arthroplasty (Right, 03/25/2014); Abdominal hysterectomy; Fracture surgery; Small intestine surgery; and Cesarean section. Family History:  Her family history includes Alzheimer's disease in her mother; Cancer in her brother; Diabetes in her sister and sister; Heart disease in her brother, father, mother, sister, and sister; Hyperlipidemia in her brother, brother, sister, and sister; Rheum arthritis in her mother.  REVIEW OF SYSTEMS  : All other systems reviewed and negative except where noted in the History of Present Illness.  PHYSICAL EXAM: BP 118/80   Pulse 92   Ht 5' 1 (1.549 m)   Wt 156 lb 8 oz (71 kg)   BMI 29.57 kg/m  Physical Exam   GENERAL APPEARANCE: Well nourished, in no apparent distress. HEENT: No cervical lymphadenopathy, unremarkable thyroid , sclerae anicteric, conjunctiva pink. RESPIRATORY: Respiratory effort normal, breath sounds equal bilaterally without rales, rhonchi, or wheezing. CARDIO: Regular rate and rhythm with  no murmurs, rubs, or gallops, peripheral pulses intact. ABDOMEN: Soft, non-distended, active bowel sounds in all four quadrants, no tenderness to palpation, no rebound, no mass appreciated, abdomen normal on palpation. RECTAL: Declines. MUSCULOSKELETAL: Full range of motion, normal gait, without edema. SKIN: Dry, intact without rashes or lesions. No jaundice. NEURO: Alert, oriented, no focal deficits. PSYCH: Cooperative, normal mood and affect.      Alan JONELLE Coombs, PA-C 11:33 AM      [1]  Allergies Allergen Reactions   Statins  Body aches, has tried several times and does not tolerate well   Contrast Media [Iodinated Contrast Media] Hives, Rash and Other (See Comments)    ALL OVER THE BODY   Ioxaglate Rash and Hives    ALL OVER THE BODY   Rosuvastatin  Other (See Comments)    Severe myalgias   "

## 2024-07-18 ENCOUNTER — Ambulatory Visit: Payer: Self-pay | Admitting: Physician Assistant

## 2024-09-13 ENCOUNTER — Ambulatory Visit: Admitting: Family Medicine
